# Patient Record
Sex: Female | Born: 1945 | Race: White | Hispanic: No | Marital: Married | Smoking: Never smoker
Health system: Southern US, Community
[De-identification: ages and names within clinical notes are randomized; demographics above are authoritative.]

## PROBLEM LIST (undated history)

## (undated) DIAGNOSIS — C801 Malignant (primary) neoplasm, unspecified: Secondary | ICD-10-CM

## (undated) DIAGNOSIS — G709 Myoneural disorder, unspecified: Secondary | ICD-10-CM

## (undated) DIAGNOSIS — N189 Chronic kidney disease, unspecified: Secondary | ICD-10-CM

## (undated) DIAGNOSIS — H269 Unspecified cataract: Secondary | ICD-10-CM

## (undated) DIAGNOSIS — Z87442 Personal history of urinary calculi: Secondary | ICD-10-CM

## (undated) DIAGNOSIS — M8580 Other specified disorders of bone density and structure, unspecified site: Secondary | ICD-10-CM

## (undated) DIAGNOSIS — M810 Age-related osteoporosis without current pathological fracture: Secondary | ICD-10-CM

## (undated) DIAGNOSIS — T7840XA Allergy, unspecified, initial encounter: Secondary | ICD-10-CM

## (undated) HISTORY — DX: Chronic kidney disease, unspecified: N18.9

## (undated) HISTORY — PX: ABDOMINAL HYSTERECTOMY: SHX81

## (undated) HISTORY — PX: MASTECTOMY: SHX3

## (undated) HISTORY — DX: Unspecified cataract: H26.9

## (undated) HISTORY — PX: EYE SURGERY: SHX253

## (undated) HISTORY — DX: Myoneural disorder, unspecified: G70.9

## (undated) HISTORY — PX: POLYPECTOMY: SHX149

## (undated) HISTORY — DX: Age-related osteoporosis without current pathological fracture: M81.0

## (undated) HISTORY — DX: Allergy, unspecified, initial encounter: T78.40XA

---

## 2000-02-06 ENCOUNTER — Other Ambulatory Visit: Admission: RE | Admit: 2000-02-06 | Discharge: 2000-02-06 | Payer: Self-pay | Admitting: Obstetrics and Gynecology

## 2000-03-27 ENCOUNTER — Other Ambulatory Visit: Admission: RE | Admit: 2000-03-27 | Discharge: 2000-03-27 | Payer: Self-pay | Admitting: Obstetrics and Gynecology

## 2000-06-04 ENCOUNTER — Encounter (INDEPENDENT_AMBULATORY_CARE_PROVIDER_SITE_OTHER): Payer: Self-pay | Admitting: Specialist

## 2000-06-04 ENCOUNTER — Inpatient Hospital Stay (HOSPITAL_COMMUNITY): Admission: RE | Admit: 2000-06-04 | Discharge: 2000-06-07 | Payer: Self-pay | Admitting: Obstetrics and Gynecology

## 2001-02-26 ENCOUNTER — Other Ambulatory Visit: Admission: RE | Admit: 2001-02-26 | Discharge: 2001-02-26 | Payer: Self-pay | Admitting: Obstetrics and Gynecology

## 2002-03-26 ENCOUNTER — Other Ambulatory Visit: Admission: RE | Admit: 2002-03-26 | Discharge: 2002-03-26 | Payer: Self-pay | Admitting: Obstetrics and Gynecology

## 2003-08-31 ENCOUNTER — Other Ambulatory Visit: Admission: RE | Admit: 2003-08-31 | Discharge: 2003-08-31 | Payer: Self-pay | Admitting: Obstetrics and Gynecology

## 2003-09-13 ENCOUNTER — Emergency Department (HOSPITAL_COMMUNITY): Admission: EM | Admit: 2003-09-13 | Discharge: 2003-09-13 | Payer: Self-pay | Admitting: Emergency Medicine

## 2003-11-09 ENCOUNTER — Ambulatory Visit: Payer: Self-pay | Admitting: Cardiology

## 2003-11-15 ENCOUNTER — Encounter: Admission: RE | Admit: 2003-11-15 | Discharge: 2003-11-15 | Payer: Self-pay | Admitting: Obstetrics and Gynecology

## 2003-11-16 ENCOUNTER — Encounter (INDEPENDENT_AMBULATORY_CARE_PROVIDER_SITE_OTHER): Payer: Self-pay | Admitting: Diagnostic Radiology

## 2003-11-16 ENCOUNTER — Encounter: Admission: RE | Admit: 2003-11-16 | Discharge: 2003-11-16 | Payer: Self-pay | Admitting: Obstetrics and Gynecology

## 2003-11-16 ENCOUNTER — Encounter (INDEPENDENT_AMBULATORY_CARE_PROVIDER_SITE_OTHER): Payer: Self-pay | Admitting: *Deleted

## 2003-11-22 ENCOUNTER — Encounter (HOSPITAL_COMMUNITY): Admission: RE | Admit: 2003-11-22 | Discharge: 2004-02-20 | Payer: Self-pay | Admitting: General Surgery

## 2003-11-24 ENCOUNTER — Encounter: Admission: RE | Admit: 2003-11-24 | Discharge: 2003-11-24 | Payer: Self-pay | Admitting: General Surgery

## 2003-12-08 ENCOUNTER — Encounter: Admission: RE | Admit: 2003-12-08 | Discharge: 2003-12-08 | Payer: Self-pay | Admitting: General Surgery

## 2003-12-14 ENCOUNTER — Encounter: Admission: RE | Admit: 2003-12-14 | Discharge: 2003-12-14 | Payer: Self-pay | Admitting: General Surgery

## 2003-12-16 ENCOUNTER — Ambulatory Visit (HOSPITAL_BASED_OUTPATIENT_CLINIC_OR_DEPARTMENT_OTHER): Admission: RE | Admit: 2003-12-16 | Discharge: 2003-12-16 | Payer: Self-pay | Admitting: General Surgery

## 2003-12-16 ENCOUNTER — Encounter (INDEPENDENT_AMBULATORY_CARE_PROVIDER_SITE_OTHER): Payer: Self-pay | Admitting: General Surgery

## 2003-12-16 ENCOUNTER — Ambulatory Visit (HOSPITAL_COMMUNITY): Admission: RE | Admit: 2003-12-16 | Discharge: 2003-12-16 | Payer: Self-pay | Admitting: General Surgery

## 2003-12-16 ENCOUNTER — Encounter (INDEPENDENT_AMBULATORY_CARE_PROVIDER_SITE_OTHER): Payer: Self-pay | Admitting: *Deleted

## 2003-12-20 ENCOUNTER — Ambulatory Visit: Payer: Self-pay | Admitting: Hematology & Oncology

## 2003-12-30 ENCOUNTER — Ambulatory Visit (HOSPITAL_COMMUNITY): Admission: RE | Admit: 2003-12-30 | Discharge: 2003-12-30 | Payer: Self-pay | Admitting: Hematology & Oncology

## 2004-01-08 ENCOUNTER — Ambulatory Visit: Admission: RE | Admit: 2004-01-08 | Discharge: 2004-02-18 | Payer: Self-pay | Admitting: *Deleted

## 2004-01-20 ENCOUNTER — Ambulatory Visit (HOSPITAL_BASED_OUTPATIENT_CLINIC_OR_DEPARTMENT_OTHER): Admission: RE | Admit: 2004-01-20 | Discharge: 2004-01-20 | Payer: Self-pay | Admitting: General Surgery

## 2004-01-20 ENCOUNTER — Encounter (INDEPENDENT_AMBULATORY_CARE_PROVIDER_SITE_OTHER): Payer: Self-pay | Admitting: Specialist

## 2004-01-20 ENCOUNTER — Ambulatory Visit (HOSPITAL_COMMUNITY): Admission: RE | Admit: 2004-01-20 | Discharge: 2004-01-20 | Payer: Self-pay | Admitting: General Surgery

## 2004-02-07 ENCOUNTER — Ambulatory Visit (HOSPITAL_COMMUNITY): Admission: RE | Admit: 2004-02-07 | Discharge: 2004-02-07 | Payer: Self-pay | Admitting: General Surgery

## 2004-02-07 ENCOUNTER — Ambulatory Visit (HOSPITAL_BASED_OUTPATIENT_CLINIC_OR_DEPARTMENT_OTHER): Admission: RE | Admit: 2004-02-07 | Discharge: 2004-02-07 | Payer: Self-pay | Admitting: General Surgery

## 2004-02-09 ENCOUNTER — Ambulatory Visit: Payer: Self-pay | Admitting: Hematology & Oncology

## 2004-02-26 ENCOUNTER — Ambulatory Visit (HOSPITAL_COMMUNITY): Admission: AD | Admit: 2004-02-26 | Discharge: 2004-02-26 | Payer: Self-pay | Admitting: General Surgery

## 2004-03-02 ENCOUNTER — Ambulatory Visit: Admission: RE | Admit: 2004-03-02 | Discharge: 2004-03-02 | Payer: Self-pay | Admitting: Hematology & Oncology

## 2004-03-11 ENCOUNTER — Ambulatory Visit (HOSPITAL_COMMUNITY): Admission: AD | Admit: 2004-03-11 | Discharge: 2004-03-11 | Payer: Self-pay | Admitting: Hematology & Oncology

## 2004-03-22 ENCOUNTER — Ambulatory Visit: Admission: RE | Admit: 2004-03-22 | Discharge: 2004-03-22 | Payer: Self-pay | Admitting: Hematology & Oncology

## 2004-03-29 ENCOUNTER — Ambulatory Visit: Payer: Self-pay | Admitting: Hematology & Oncology

## 2004-04-13 ENCOUNTER — Ambulatory Visit: Admission: RE | Admit: 2004-04-13 | Discharge: 2004-04-13 | Payer: Self-pay | Admitting: Hematology & Oncology

## 2004-05-16 ENCOUNTER — Ambulatory Visit: Payer: Self-pay | Admitting: Hematology & Oncology

## 2004-06-12 ENCOUNTER — Ambulatory Visit: Admission: RE | Admit: 2004-06-12 | Discharge: 2004-08-21 | Payer: Self-pay | Admitting: *Deleted

## 2004-06-12 ENCOUNTER — Ambulatory Visit (HOSPITAL_BASED_OUTPATIENT_CLINIC_OR_DEPARTMENT_OTHER): Admission: RE | Admit: 2004-06-12 | Discharge: 2004-06-12 | Payer: Self-pay | Admitting: General Surgery

## 2004-06-28 ENCOUNTER — Encounter: Admission: RE | Admit: 2004-06-28 | Discharge: 2004-07-07 | Payer: Self-pay | Admitting: General Surgery

## 2004-07-04 ENCOUNTER — Ambulatory Visit: Payer: Self-pay | Admitting: Hematology & Oncology

## 2004-08-22 ENCOUNTER — Ambulatory Visit: Payer: Self-pay | Admitting: Hematology & Oncology

## 2004-11-16 ENCOUNTER — Ambulatory Visit: Payer: Self-pay | Admitting: Hematology & Oncology

## 2004-12-20 ENCOUNTER — Other Ambulatory Visit: Admission: RE | Admit: 2004-12-20 | Discharge: 2004-12-20 | Payer: Self-pay | Admitting: Obstetrics and Gynecology

## 2005-01-10 ENCOUNTER — Ambulatory Visit: Payer: Self-pay | Admitting: Internal Medicine

## 2005-02-02 ENCOUNTER — Ambulatory Visit: Payer: Self-pay | Admitting: Internal Medicine

## 2005-02-15 ENCOUNTER — Encounter: Admission: RE | Admit: 2005-02-15 | Discharge: 2005-02-15 | Payer: Self-pay | Admitting: Obstetrics and Gynecology

## 2005-03-28 ENCOUNTER — Ambulatory Visit: Payer: Self-pay | Admitting: Hematology & Oncology

## 2005-07-02 ENCOUNTER — Ambulatory Visit: Payer: Self-pay | Admitting: Hematology & Oncology

## 2005-07-04 LAB — CBC WITH DIFFERENTIAL/PLATELET
Basophils Absolute: 0 10*3/uL (ref 0.0–0.1)
Eosinophils Absolute: 0.6 10*3/uL — ABNORMAL HIGH (ref 0.0–0.5)
HCT: 41.6 % (ref 34.8–46.6)
LYMPH%: 30 % (ref 14.0–48.0)
MONO#: 0.4 10*3/uL (ref 0.1–0.9)
NEUT#: 2.1 10*3/uL (ref 1.5–6.5)
NEUT%: 47.1 % (ref 39.6–76.8)
Platelets: 220 10*3/uL (ref 145–400)
WBC: 4.6 10*3/uL (ref 3.9–10.0)

## 2005-07-04 LAB — COMPREHENSIVE METABOLIC PANEL
CO2: 26 mEq/L (ref 19–32)
Creatinine, Ser: 0.75 mg/dL (ref 0.40–1.20)
Glucose, Bld: 122 mg/dL — ABNORMAL HIGH (ref 70–99)
Total Bilirubin: 0.7 mg/dL (ref 0.3–1.2)
Total Protein: 6.4 g/dL (ref 6.0–8.3)

## 2005-07-04 LAB — LACTATE DEHYDROGENASE: LDH: 141 U/L (ref 94–250)

## 2005-10-24 ENCOUNTER — Ambulatory Visit: Payer: Self-pay | Admitting: Hematology & Oncology

## 2006-02-19 ENCOUNTER — Ambulatory Visit: Payer: Self-pay | Admitting: Hematology & Oncology

## 2006-02-22 ENCOUNTER — Ambulatory Visit (HOSPITAL_COMMUNITY): Admission: RE | Admit: 2006-02-22 | Discharge: 2006-02-22 | Payer: Self-pay | Admitting: Hematology & Oncology

## 2006-02-22 LAB — CBC WITH DIFFERENTIAL/PLATELET
Basophils Absolute: 0 10*3/uL (ref 0.0–0.1)
EOS%: 5.8 % (ref 0.0–7.0)
HCT: 40.3 % (ref 34.8–46.6)
MONO%: 14.9 % — ABNORMAL HIGH (ref 0.0–13.0)
NEUT#: 1.3 10*3/uL — ABNORMAL LOW (ref 1.5–6.5)
Platelets: 194 10*3/uL (ref 145–400)
RBC: 4.3 10*6/uL (ref 3.70–5.32)
RDW: 12.9 % (ref 11.3–14.5)
WBC: 2.9 10*3/uL — ABNORMAL LOW (ref 3.9–10.0)

## 2006-02-22 LAB — COMPREHENSIVE METABOLIC PANEL
AST: 19 U/L (ref 0–37)
Albumin: 4.3 g/dL (ref 3.5–5.2)
Alkaline Phosphatase: 90 U/L (ref 39–117)
BUN: 14 mg/dL (ref 6–23)
Potassium: 3.9 mEq/L (ref 3.5–5.3)
Total Bilirubin: 0.6 mg/dL (ref 0.3–1.2)

## 2006-03-07 ENCOUNTER — Encounter: Admission: RE | Admit: 2006-03-07 | Discharge: 2006-03-07 | Payer: Self-pay | Admitting: Obstetrics and Gynecology

## 2006-03-19 ENCOUNTER — Encounter: Admission: RE | Admit: 2006-03-19 | Discharge: 2006-03-19 | Payer: Self-pay | Admitting: General Surgery

## 2006-06-20 ENCOUNTER — Ambulatory Visit: Payer: Self-pay | Admitting: Hematology & Oncology

## 2006-06-24 LAB — CBC WITH DIFFERENTIAL/PLATELET
Basophils Absolute: 0 10*3/uL (ref 0.0–0.1)
EOS%: 2.8 % (ref 0.0–7.0)
HCT: 38.9 % (ref 34.8–46.6)
HGB: 13.7 g/dL (ref 11.6–15.9)
LYMPH%: 23.1 % (ref 14.0–48.0)
MCH: 32.8 pg (ref 26.0–34.0)
MCV: 92.9 fL (ref 81.0–101.0)
MONO%: 6.6 % (ref 0.0–13.0)
NEUT%: 66.7 % (ref 39.6–76.8)
Platelets: 231 10*3/uL (ref 145–400)
RDW: 12.6 % (ref 11.3–14.5)

## 2006-06-24 LAB — LIPID PANEL
HDL: 61 mg/dL (ref >39)
LDL Cholesterol: 134 mg/dL — ABNORMAL HIGH (ref 0–99)
Total CHOL/HDL Ratio: 3.5 Ratio
Triglycerides: 85 mg/dL (ref <150)

## 2006-06-24 LAB — COMPREHENSIVE METABOLIC PANEL
AST: 20 U/L (ref 0–37)
Alkaline Phosphatase: 83 U/L (ref 39–117)
BUN: 14 mg/dL (ref 6–23)
Creatinine, Ser: 0.63 mg/dL (ref 0.40–1.20)

## 2006-11-19 ENCOUNTER — Ambulatory Visit (HOSPITAL_COMMUNITY): Admission: RE | Admit: 2006-11-19 | Discharge: 2006-11-19 | Payer: Self-pay | Admitting: Physician Assistant

## 2006-12-19 ENCOUNTER — Ambulatory Visit: Payer: Self-pay | Admitting: Hematology & Oncology

## 2006-12-23 LAB — CBC WITH DIFFERENTIAL/PLATELET
BASO%: 0.5 % (ref 0.0–2.0)
EOS%: 5.1 % (ref 0.0–7.0)
LYMPH%: 29.4 % (ref 14.0–48.0)
MCH: 32.6 pg (ref 26.0–34.0)
MCHC: 35.4 g/dL (ref 32.0–36.0)
MONO#: 0.3 10*3/uL (ref 0.1–0.9)
Platelets: 228 10*3/uL (ref 145–400)
RBC: 4.49 10*6/uL (ref 3.70–5.32)
WBC: 4.3 10*3/uL (ref 3.9–10.0)
lymph#: 1.3 10*3/uL (ref 0.9–3.3)

## 2006-12-23 LAB — COMPREHENSIVE METABOLIC PANEL
ALT: 29 U/L (ref 0–35)
Albumin: 4.3 g/dL (ref 3.5–5.2)
CO2: 28 mEq/L (ref 19–32)
Calcium: 9.7 mg/dL (ref 8.4–10.5)
Chloride: 104 mEq/L (ref 96–112)
Creatinine, Ser: 0.8 mg/dL (ref 0.40–1.20)
Potassium: 4.2 mEq/L (ref 3.5–5.3)
Sodium: 143 mEq/L (ref 135–145)
Total Protein: 6.6 g/dL (ref 6.0–8.3)

## 2007-01-09 DIAGNOSIS — C801 Malignant (primary) neoplasm, unspecified: Secondary | ICD-10-CM

## 2007-01-09 HISTORY — DX: Malignant (primary) neoplasm, unspecified: C80.1

## 2007-04-18 ENCOUNTER — Encounter (INDEPENDENT_AMBULATORY_CARE_PROVIDER_SITE_OTHER): Payer: Self-pay | Admitting: Radiology

## 2007-04-18 ENCOUNTER — Encounter: Admission: RE | Admit: 2007-04-18 | Discharge: 2007-04-18 | Payer: Self-pay | Admitting: Obstetrics and Gynecology

## 2007-06-03 ENCOUNTER — Inpatient Hospital Stay (HOSPITAL_COMMUNITY): Admission: RE | Admit: 2007-06-03 | Discharge: 2007-06-06 | Payer: Self-pay | Admitting: General Surgery

## 2007-06-03 ENCOUNTER — Encounter (INDEPENDENT_AMBULATORY_CARE_PROVIDER_SITE_OTHER): Payer: Self-pay | Admitting: General Surgery

## 2007-06-17 ENCOUNTER — Ambulatory Visit: Payer: Self-pay | Admitting: Hematology & Oncology

## 2007-06-23 LAB — CBC WITH DIFFERENTIAL/PLATELET
BASO%: 0.2 % (ref 0.0–2.0)
Eosinophils Absolute: 0.2 10*3/uL (ref 0.0–0.5)
HCT: 39.3 % (ref 34.8–46.6)
LYMPH%: 13.8 % — ABNORMAL LOW (ref 14.0–48.0)
MCHC: 34.2 g/dL (ref 32.0–36.0)
MCV: 91.3 fL (ref 81.0–101.0)
MONO#: 0.5 10*3/uL (ref 0.1–0.9)
NEUT%: 78.7 % — ABNORMAL HIGH (ref 39.6–76.8)
Platelets: 329 10*3/uL (ref 145–400)
WBC: 9.3 10*3/uL (ref 3.9–10.0)

## 2007-06-23 LAB — COMPREHENSIVE METABOLIC PANEL
CO2: 25 mEq/L (ref 19–32)
Creatinine, Ser: 0.7 mg/dL (ref 0.40–1.20)
Glucose, Bld: 134 mg/dL — ABNORMAL HIGH (ref 70–99)
Total Bilirubin: 0.7 mg/dL (ref 0.3–1.2)

## 2007-06-24 ENCOUNTER — Inpatient Hospital Stay (HOSPITAL_COMMUNITY): Admission: EM | Admit: 2007-06-24 | Discharge: 2007-06-26 | Payer: Self-pay | Admitting: Emergency Medicine

## 2007-06-24 ENCOUNTER — Ambulatory Visit (HOSPITAL_COMMUNITY): Admission: RE | Admit: 2007-06-24 | Discharge: 2007-06-24 | Payer: Self-pay | Admitting: Hematology & Oncology

## 2007-06-25 ENCOUNTER — Ambulatory Visit: Payer: Self-pay | Admitting: Hematology & Oncology

## 2007-06-25 ENCOUNTER — Encounter (INDEPENDENT_AMBULATORY_CARE_PROVIDER_SITE_OTHER): Payer: Self-pay | Admitting: Internal Medicine

## 2007-07-18 ENCOUNTER — Ambulatory Visit: Payer: Self-pay | Admitting: Hematology & Oncology

## 2007-07-21 LAB — PROTIME-INR (CHCC SATELLITE): Protime: 38.4 Seconds — ABNORMAL HIGH (ref 10.6–13.4)

## 2007-08-01 LAB — PROTIME-INR (CHCC SATELLITE)
INR: 2.8 (ref 2.0–3.5)
Protime: 33.6 Seconds — ABNORMAL HIGH (ref 10.6–13.4)

## 2007-09-01 ENCOUNTER — Ambulatory Visit: Payer: Self-pay | Admitting: Hematology & Oncology

## 2007-09-04 ENCOUNTER — Ambulatory Visit: Payer: Self-pay | Admitting: Hematology & Oncology

## 2007-09-04 LAB — COMPREHENSIVE METABOLIC PANEL
ALT: 20 U/L (ref 0–35)
Albumin: 4.3 g/dL (ref 3.5–5.2)
Alkaline Phosphatase: 73 U/L (ref 39–117)
Glucose, Bld: 98 mg/dL (ref 70–99)
Potassium: 4.2 mEq/L (ref 3.5–5.3)
Sodium: 140 mEq/L (ref 135–145)
Total Protein: 6.7 g/dL (ref 6.0–8.3)

## 2007-09-04 LAB — CBC WITH DIFFERENTIAL (CANCER CENTER ONLY)
BASO#: 0 10*3/uL (ref 0.0–0.2)
Eosinophils Absolute: 0.1 10*3/uL (ref 0.0–0.5)
HGB: 14.6 g/dL (ref 11.6–15.9)
LYMPH#: 1.1 10*3/uL (ref 0.9–3.3)
MCH: 30.8 pg (ref 26.0–34.0)
MONO#: 0.3 10*3/uL (ref 0.1–0.9)
NEUT#: 2.6 10*3/uL (ref 1.5–6.5)
Platelets: 187 10*3/uL (ref 145–400)
RBC: 4.73 10*6/uL (ref 3.70–5.32)
WBC: 4.2 10*3/uL (ref 3.9–10.0)

## 2007-09-04 LAB — PROTIME-INR (CHCC SATELLITE): Protime: 25.2 Seconds — ABNORMAL HIGH (ref 10.6–13.4)

## 2007-09-11 LAB — CBC WITH DIFFERENTIAL (CANCER CENTER ONLY)
BASO%: 0.9 % (ref 0.0–2.0)
EOS%: 5.4 % (ref 0.0–7.0)
LYMPH#: 1.5 10*3/uL (ref 0.9–3.3)
MCH: 29.6 pg (ref 26.0–34.0)
MCHC: 34.2 g/dL (ref 32.0–36.0)
MONO%: 4.5 % (ref 0.0–13.0)
NEUT#: 1.5 10*3/uL (ref 1.5–6.5)
RDW: 14.1 % (ref 10.5–14.6)

## 2007-09-11 LAB — PROTIME-INR (CHCC SATELLITE): INR: 2.4 (ref 2.0–3.5)

## 2007-09-18 LAB — CBC WITH DIFFERENTIAL (CANCER CENTER ONLY)
BASO#: 0 10*3/uL (ref 0.0–0.2)
BASO%: 0.9 % (ref 0.0–2.0)
EOS%: 6.7 % (ref 0.0–7.0)
HCT: 38.5 % (ref 34.8–46.6)
LYMPH%: 53.2 % — ABNORMAL HIGH (ref 14.0–48.0)
MCH: 29.3 pg (ref 26.0–34.0)
MCHC: 34.3 g/dL (ref 32.0–36.0)
MCV: 86 fL (ref 81–101)
MONO%: 5.4 % (ref 0.0–13.0)
NEUT%: 33.8 % — ABNORMAL LOW (ref 39.6–80.0)
RDW: 14.7 % — ABNORMAL HIGH (ref 10.5–14.6)

## 2007-09-18 LAB — PROTIME-INR (CHCC SATELLITE): Protime: 26.4 Seconds — ABNORMAL HIGH (ref 10.6–13.4)

## 2007-09-30 LAB — CBC WITH DIFFERENTIAL/PLATELET
BASO%: 1.9 % (ref 0.0–2.0)
EOS%: 2.8 % (ref 0.0–7.0)
HCT: 40.4 % (ref 34.8–46.6)
LYMPH%: 36.3 % (ref 14.0–48.0)
MCH: 29.9 pg (ref 26.0–34.0)
MCHC: 34.4 g/dL (ref 32.0–36.0)
MCV: 87.2 fL (ref 81.0–101.0)
MONO%: 12.2 % (ref 0.0–13.0)
NEUT%: 46.9 % (ref 39.6–76.8)
Platelets: 310 10*3/uL (ref 145–400)
RBC: 4.63 10*6/uL (ref 3.70–5.32)
WBC: 3.1 10*3/uL — ABNORMAL LOW (ref 3.9–10.0)

## 2007-10-02 LAB — COMPREHENSIVE METABOLIC PANEL
ALT: 36 U/L — ABNORMAL HIGH (ref 0–35)
AST: 26 U/L (ref 0–37)
Alkaline Phosphatase: 105 U/L (ref 39–117)
CO2: 26 mEq/L (ref 19–32)
Creatinine, Ser: 0.67 mg/dL (ref 0.40–1.20)
Sodium: 141 mEq/L (ref 135–145)
Total Bilirubin: 0.5 mg/dL (ref 0.3–1.2)
Total Protein: 7.2 g/dL (ref 6.0–8.3)

## 2007-10-02 LAB — CBC WITH DIFFERENTIAL (CANCER CENTER ONLY)
BASO#: 0 10*3/uL (ref 0.0–0.2)
BASO%: 0.9 % (ref 0.0–2.0)
LYMPH#: 1.4 10*3/uL (ref 0.9–3.3)
LYMPH%: 33.6 % (ref 14.0–48.0)
MCH: 29.4 pg (ref 26.0–34.0)
MCHC: 34.1 g/dL (ref 32.0–36.0)
NEUT#: 2.1 10*3/uL (ref 1.5–6.5)
NEUT%: 52.5 % (ref 39.6–80.0)
Platelets: 259 10*3/uL (ref 145–400)
RBC: 4.73 10*6/uL (ref 3.70–5.32)

## 2007-10-02 LAB — PROTIME-INR (CHCC SATELLITE): Protime: 27.6 Seconds — ABNORMAL HIGH (ref 10.6–13.4)

## 2007-10-09 LAB — CBC WITH DIFFERENTIAL (CANCER CENTER ONLY)
BASO#: 0 10*3/uL (ref 0.0–0.2)
EOS%: 4.8 % (ref 0.0–7.0)
Eosinophils Absolute: 0.2 10*3/uL (ref 0.0–0.5)
HCT: 37.9 % (ref 34.8–46.6)
HGB: 13 g/dL (ref 11.6–15.9)
LYMPH#: 1.6 10*3/uL (ref 0.9–3.3)
MCHC: 34.4 g/dL (ref 32.0–36.0)
NEUT%: 47.2 % (ref 39.6–80.0)
RBC: 4.42 10*6/uL (ref 3.70–5.32)

## 2007-10-16 LAB — CBC WITH DIFFERENTIAL (CANCER CENTER ONLY)
BASO#: 0 10*3/uL (ref 0.0–0.2)
EOS%: 5.8 % (ref 0.0–7.0)
Eosinophils Absolute: 0.2 10*3/uL (ref 0.0–0.5)
HCT: 35.7 % (ref 34.8–46.6)
HGB: 12.5 g/dL (ref 11.6–15.9)
LYMPH%: 46.5 % (ref 14.0–48.0)
MCH: 30.2 pg (ref 26.0–34.0)
MCHC: 34.9 g/dL (ref 32.0–36.0)
MCV: 86 fL (ref 81–101)
MONO%: 4.5 % (ref 0.0–13.0)
NEUT%: 42.2 % (ref 39.6–80.0)
RBC: 4.14 10*6/uL (ref 3.70–5.32)

## 2007-10-16 LAB — PROTIME-INR (CHCC SATELLITE): Protime: 26.4 Seconds — ABNORMAL HIGH (ref 10.6–13.4)

## 2007-10-27 ENCOUNTER — Ambulatory Visit: Payer: Self-pay | Admitting: Hematology & Oncology

## 2007-10-28 LAB — COMPREHENSIVE METABOLIC PANEL
ALT: 25 U/L (ref 0–35)
AST: 21 U/L (ref 0–37)
Albumin: 4.1 g/dL (ref 3.5–5.2)
BUN: 13 mg/dL (ref 6–23)
CO2: 24 mEq/L (ref 19–32)
Calcium: 9.4 mg/dL (ref 8.4–10.5)
Chloride: 106 mEq/L (ref 96–112)
Potassium: 3.9 mEq/L (ref 3.5–5.3)

## 2007-10-28 LAB — CBC WITH DIFFERENTIAL (CANCER CENTER ONLY)
EOS%: 4.1 % (ref 0.0–7.0)
Eosinophils Absolute: 0.1 10*3/uL (ref 0.0–0.5)
LYMPH%: 41.4 % (ref 14.0–48.0)
MCH: 30.2 pg (ref 26.0–34.0)
MCHC: 34.4 g/dL (ref 32.0–36.0)
MCV: 88 fL (ref 81–101)
MONO%: 9.1 % (ref 0.0–13.0)
NEUT#: 1.3 10*3/uL — ABNORMAL LOW (ref 1.5–6.5)
Platelets: 326 10*3/uL (ref 145–400)

## 2007-10-28 LAB — PROTIME-INR (CHCC SATELLITE): INR: 1.7 — ABNORMAL LOW (ref 2.0–3.5)

## 2007-11-06 LAB — CBC WITH DIFFERENTIAL (CANCER CENTER ONLY)
BASO#: 0 10*3/uL (ref 0.0–0.2)
EOS%: 3.6 % (ref 0.0–7.0)
HGB: 12.6 g/dL (ref 11.6–15.9)
LYMPH#: 1.8 10*3/uL (ref 0.9–3.3)
MCH: 30.5 pg (ref 26.0–34.0)
MCHC: 34.5 g/dL (ref 32.0–36.0)
MONO%: 7.4 % (ref 0.0–13.0)
NEUT#: 2.1 10*3/uL (ref 1.5–6.5)
Platelets: 230 10*3/uL (ref 145–400)
RBC: 4.15 10*6/uL (ref 3.70–5.32)

## 2007-11-13 LAB — CBC WITH DIFFERENTIAL (CANCER CENTER ONLY)
BASO#: 0 10*3/uL (ref 0.0–0.2)
Eosinophils Absolute: 0.2 10*3/uL (ref 0.0–0.5)
HGB: 12 g/dL (ref 11.6–15.9)
MCV: 91 fL (ref 81–101)
MONO#: 0.2 10*3/uL (ref 0.1–0.9)
NEUT#: 1.8 10*3/uL (ref 1.5–6.5)
Platelets: 237 10*3/uL (ref 145–400)
RBC: 3.83 10*6/uL (ref 3.70–5.32)
WBC: 3.5 10*3/uL — ABNORMAL LOW (ref 3.9–10.0)

## 2007-11-13 LAB — PROTIME-INR (CHCC SATELLITE)
INR: 4.3 — ABNORMAL HIGH (ref 2.0–3.5)
Protime: 51.6 Seconds — ABNORMAL HIGH (ref 10.6–13.4)

## 2007-11-27 LAB — CBC WITH DIFFERENTIAL (CANCER CENTER ONLY)
BASO%: 0.9 % (ref 0.0–2.0)
EOS%: 3.9 % (ref 0.0–7.0)
LYMPH#: 1.3 10*3/uL (ref 0.9–3.3)
MONO#: 0.4 10*3/uL (ref 0.1–0.9)
Platelets: 289 10*3/uL (ref 145–400)
RDW: 14.3 % (ref 10.5–14.6)
WBC: 3.7 10*3/uL — ABNORMAL LOW (ref 3.9–10.0)

## 2007-11-27 LAB — PROTIME-INR (CHCC SATELLITE)
INR: 2.8 (ref 2.0–3.5)
Protime: 33.6 Seconds — ABNORMAL HIGH (ref 10.6–13.4)

## 2007-11-28 LAB — COMPREHENSIVE METABOLIC PANEL
AST: 18 U/L (ref 0–37)
Alkaline Phosphatase: 85 U/L (ref 39–117)
BUN: 13 mg/dL (ref 6–23)
Glucose, Bld: 96 mg/dL (ref 70–99)
Sodium: 141 mEq/L (ref 135–145)
Total Bilirubin: 0.6 mg/dL (ref 0.3–1.2)

## 2007-11-28 LAB — LIPID PANEL
HDL: 46 mg/dL (ref >39)
LDL Cholesterol: 151 mg/dL — ABNORMAL HIGH (ref 0–99)

## 2007-12-05 LAB — CBC WITH DIFFERENTIAL (CANCER CENTER ONLY)
BASO#: 0 10*3/uL (ref 0.0–0.2)
BASO%: 0.8 % (ref 0.0–2.0)
HCT: 34.8 % (ref 34.8–46.6)
HGB: 11.9 g/dL (ref 11.6–15.9)
LYMPH#: 1.5 10*3/uL (ref 0.9–3.3)
MONO#: 0.4 10*3/uL (ref 0.1–0.9)
NEUT#: 3.2 10*3/uL (ref 1.5–6.5)
NEUT%: 60.2 % (ref 39.6–80.0)
RDW: 14 % (ref 10.5–14.6)
WBC: 5.2 10*3/uL (ref 3.9–10.0)

## 2007-12-11 LAB — CBC WITH DIFFERENTIAL (CANCER CENTER ONLY)
BASO#: 0 10*3/uL (ref 0.0–0.2)
BASO%: 0.8 % (ref 0.0–2.0)
EOS%: 6 % (ref 0.0–7.0)
HCT: 35.2 % (ref 34.8–46.6)
HGB: 12.3 g/dL (ref 11.6–15.9)
LYMPH#: 1 10*3/uL (ref 0.9–3.3)
LYMPH%: 32.9 % (ref 14.0–48.0)
MCH: 32.5 pg (ref 26.0–34.0)
MCHC: 34.9 g/dL (ref 32.0–36.0)
MCV: 93 fL (ref 81–101)
MONO%: 5.6 % (ref 0.0–13.0)
NEUT%: 54.7 % (ref 39.6–80.0)
RDW: 13.6 % (ref 10.5–14.6)

## 2007-12-11 LAB — PROTIME-INR (CHCC SATELLITE)

## 2007-12-18 ENCOUNTER — Ambulatory Visit (HOSPITAL_COMMUNITY): Admission: RE | Admit: 2007-12-18 | Discharge: 2007-12-18 | Payer: Self-pay | Admitting: Hematology & Oncology

## 2008-01-05 ENCOUNTER — Ambulatory Visit: Payer: Self-pay | Admitting: Hematology & Oncology

## 2008-01-05 LAB — CBC WITH DIFFERENTIAL (CANCER CENTER ONLY)
BASO#: 0 10*3/uL (ref 0.0–0.2)
EOS%: 5.2 % (ref 0.0–7.0)
Eosinophils Absolute: 0.2 10*3/uL (ref 0.0–0.5)
HGB: 13.8 g/dL (ref 11.6–15.9)
LYMPH#: 1.5 10*3/uL (ref 0.9–3.3)
MCH: 31.6 pg (ref 26.0–34.0)
MONO%: 6.7 % (ref 0.0–13.0)
NEUT#: 2.4 10*3/uL (ref 1.5–6.5)
Platelets: 211 10*3/uL (ref 145–400)
RBC: 4.35 10*6/uL (ref 3.70–5.32)

## 2008-01-05 LAB — PROTIME-INR (CHCC SATELLITE): Protime: 28.8 Seconds — ABNORMAL HIGH (ref 10.6–13.4)

## 2008-01-12 LAB — PROTIME-INR (CHCC SATELLITE)

## 2008-01-15 LAB — CBC WITH DIFFERENTIAL (CANCER CENTER ONLY)
BASO#: 0 10*3/uL (ref 0.0–0.2)
BASO%: 0.5 % (ref 0.0–2.0)
HCT: 39.9 % (ref 34.8–46.6)
HGB: 13.4 g/dL (ref 11.6–15.9)
LYMPH%: 22.4 % (ref 14.0–48.0)
MCHC: 33.6 g/dL (ref 32.0–36.0)
MCV: 93 fL (ref 81–101)
MONO#: 0.4 10*3/uL (ref 0.1–0.9)
NEUT%: 61.7 % (ref 39.6–80.0)
RDW: 12.9 % (ref 10.5–14.6)
WBC: 4.9 10*3/uL (ref 3.9–10.0)

## 2008-01-15 LAB — COMPREHENSIVE METABOLIC PANEL
ALT: 38 U/L — ABNORMAL HIGH (ref 0–35)
AST: 19 U/L (ref 0–37)
Albumin: 4 g/dL (ref 3.5–5.2)
Alkaline Phosphatase: 124 U/L — ABNORMAL HIGH (ref 39–117)
BUN: 12 mg/dL (ref 6–23)
CO2: 26 mEq/L (ref 19–32)
Calcium: 9.2 mg/dL (ref 8.4–10.5)
Chloride: 104 mEq/L (ref 96–112)
Creatinine, Ser: 0.59 mg/dL (ref 0.40–1.20)
Glucose, Bld: 86 mg/dL (ref 70–99)
Potassium: 4.2 mEq/L (ref 3.5–5.3)
Sodium: 141 mEq/L (ref 135–145)
Total Bilirubin: 0.4 mg/dL (ref 0.3–1.2)
Total Protein: 6.5 g/dL (ref 6.0–8.3)

## 2008-01-15 LAB — PROTIME-INR (CHCC SATELLITE): Protime: 32.4 Seconds — ABNORMAL HIGH (ref 10.6–13.4)

## 2008-02-05 LAB — COMPREHENSIVE METABOLIC PANEL
ALT: 22 U/L (ref 0–35)
AST: 22 U/L (ref 0–37)
Albumin: 4.2 g/dL (ref 3.5–5.2)
Alkaline Phosphatase: 81 U/L (ref 39–117)
BUN: 14 mg/dL (ref 6–23)
Calcium: 9.9 mg/dL (ref 8.4–10.5)
Chloride: 104 mEq/L (ref 96–112)
Potassium: 4.1 mEq/L (ref 3.5–5.3)

## 2008-02-05 LAB — CBC WITH DIFFERENTIAL (CANCER CENTER ONLY)
BASO#: 0 10*3/uL (ref 0.0–0.2)
BASO%: 0.7 % (ref 0.0–2.0)
EOS%: 6.6 % (ref 0.0–7.0)
LYMPH#: 1.4 10*3/uL (ref 0.9–3.3)
MCH: 30.6 pg (ref 26.0–34.0)
MCHC: 33.6 g/dL (ref 32.0–36.0)
MONO%: 6.3 % (ref 0.0–13.0)
NEUT#: 1.6 10*3/uL (ref 1.5–6.5)
Platelets: 157 10*3/uL (ref 145–400)
RDW: 13.1 % (ref 10.5–14.6)

## 2008-02-05 LAB — PROTIME-INR (CHCC SATELLITE): INR: 3 (ref 2.0–3.5)

## 2008-02-20 ENCOUNTER — Ambulatory Visit: Payer: Self-pay | Admitting: Hematology & Oncology

## 2008-02-20 LAB — CBC WITH DIFFERENTIAL (CANCER CENTER ONLY)
Eosinophils Absolute: 0.2 10*3/uL (ref 0.0–0.5)
HCT: 40.3 % (ref 34.8–46.6)
LYMPH#: 1.4 10*3/uL (ref 0.9–3.3)
LYMPH%: 34.5 % (ref 14.0–48.0)
MCV: 91 fL (ref 81–101)
MONO#: 0.3 10*3/uL (ref 0.1–0.9)
NEUT%: 51.8 % (ref 39.6–80.0)
RBC: 4.4 10*6/uL (ref 3.70–5.32)
WBC: 4.2 10*3/uL (ref 3.9–10.0)

## 2008-03-19 LAB — PROTIME-INR (CHCC SATELLITE)

## 2008-03-30 ENCOUNTER — Ambulatory Visit (HOSPITAL_BASED_OUTPATIENT_CLINIC_OR_DEPARTMENT_OTHER): Admission: RE | Admit: 2008-03-30 | Discharge: 2008-03-30 | Payer: Self-pay | Admitting: Hematology & Oncology

## 2008-03-30 ENCOUNTER — Ambulatory Visit: Payer: Self-pay | Admitting: Diagnostic Radiology

## 2008-04-14 ENCOUNTER — Ambulatory Visit: Payer: Self-pay | Admitting: Hematology & Oncology

## 2008-04-16 LAB — CBC WITH DIFFERENTIAL (CANCER CENTER ONLY)
BASO#: 0 10*3/uL (ref 0.0–0.2)
EOS%: 6.5 % (ref 0.0–7.0)
Eosinophils Absolute: 0.2 10*3/uL (ref 0.0–0.5)
HCT: 42.7 % (ref 34.8–46.6)
HGB: 14.2 g/dL (ref 11.6–15.9)
LYMPH%: 39.3 % (ref 14.0–48.0)
MCH: 30.9 pg (ref 26.0–34.0)
MCHC: 33.4 g/dL (ref 32.0–36.0)
MCV: 93 fL (ref 81–101)
MONO%: 7.4 % (ref 0.0–13.0)
NEUT%: 46.2 % (ref 39.6–80.0)
RBC: 4.61 10*6/uL (ref 3.70–5.32)

## 2008-04-16 LAB — COMPREHENSIVE METABOLIC PANEL
ALT: 23 U/L (ref 0–35)
AST: 20 U/L (ref 0–37)
Albumin: 4.4 g/dL (ref 3.5–5.2)
Alkaline Phosphatase: 70 U/L (ref 39–117)
BUN: 14 mg/dL (ref 6–23)
Calcium: 10.1 mg/dL (ref 8.4–10.5)
Chloride: 106 mEq/L (ref 96–112)
Potassium: 4.2 mEq/L (ref 3.5–5.3)
Sodium: 143 mEq/L (ref 135–145)
Total Protein: 6.6 g/dL (ref 6.0–8.3)

## 2008-04-16 LAB — PROTIME-INR (CHCC SATELLITE)

## 2008-05-21 LAB — PROTIME-INR (CHCC SATELLITE): INR: 2.2 (ref 2.0–3.5)

## 2008-06-15 ENCOUNTER — Ambulatory Visit: Payer: Self-pay | Admitting: Hematology & Oncology

## 2008-06-17 LAB — CBC WITH DIFFERENTIAL (CANCER CENTER ONLY)
BASO#: 0 10*3/uL (ref 0.0–0.2)
EOS%: 4.9 % (ref 0.0–7.0)
Eosinophils Absolute: 0.2 10*3/uL (ref 0.0–0.5)
HCT: 40.9 % (ref 34.8–46.6)
HGB: 13.7 g/dL (ref 11.6–15.9)
LYMPH#: 1.4 10*3/uL (ref 0.9–3.3)
MCHC: 33.6 g/dL (ref 32.0–36.0)
NEUT#: 1.8 10*3/uL (ref 1.5–6.5)
NEUT%: 49.6 % (ref 39.6–80.0)
RBC: 4.38 10*6/uL (ref 3.70–5.32)

## 2008-06-18 LAB — COMPREHENSIVE METABOLIC PANEL
Albumin: 4.2 g/dL (ref 3.5–5.2)
CO2: 27 mEq/L (ref 19–32)
Glucose, Bld: 87 mg/dL (ref 70–99)
Potassium: 4.3 mEq/L (ref 3.5–5.3)
Sodium: 144 mEq/L (ref 135–145)
Total Protein: 6.6 g/dL (ref 6.0–8.3)

## 2008-06-18 LAB — VITAMIN D 25 HYDROXY (VIT D DEFICIENCY, FRACTURES): Vit D, 25-Hydroxy: 42 ng/mL (ref 30–89)

## 2008-06-23 ENCOUNTER — Encounter: Admission: RE | Admit: 2008-06-23 | Discharge: 2008-06-23 | Payer: Self-pay | Admitting: Hematology & Oncology

## 2008-07-21 ENCOUNTER — Ambulatory Visit: Payer: Self-pay | Admitting: Hematology & Oncology

## 2008-07-22 LAB — PROTIME-INR (CHCC SATELLITE): Protime: 30 Seconds — ABNORMAL HIGH (ref 10.6–13.4)

## 2008-08-17 ENCOUNTER — Ambulatory Visit: Payer: Self-pay | Admitting: Diagnostic Radiology

## 2008-08-17 ENCOUNTER — Ambulatory Visit (HOSPITAL_BASED_OUTPATIENT_CLINIC_OR_DEPARTMENT_OTHER): Admission: RE | Admit: 2008-08-17 | Discharge: 2008-08-17 | Payer: Self-pay | Admitting: Hematology & Oncology

## 2008-08-17 LAB — PROTIME-INR (CHCC SATELLITE)
INR: 2.4 (ref 2.0–3.5)
Protime: 28.8 Seconds — ABNORMAL HIGH (ref 10.6–13.4)

## 2008-09-15 ENCOUNTER — Ambulatory Visit: Payer: Self-pay | Admitting: Hematology & Oncology

## 2008-09-16 LAB — CBC WITH DIFFERENTIAL (CANCER CENTER ONLY)
BASO#: 0 10*3/uL (ref 0.0–0.2)
BASO%: 1 % (ref 0.0–2.0)
EOS%: 6.8 % (ref 0.0–7.0)
HCT: 41.7 % (ref 34.8–46.6)
HGB: 14.4 g/dL (ref 11.6–15.9)
LYMPH#: 1.6 10*3/uL (ref 0.9–3.3)
MONO#: 0.3 10*3/uL (ref 0.1–0.9)
NEUT#: 2 10*3/uL (ref 1.5–6.5)
NEUT%: 46.7 % (ref 39.6–80.0)
RDW: 11.4 % (ref 10.5–14.6)
WBC: 4.2 10*3/uL (ref 3.9–10.0)

## 2008-09-16 LAB — PROTIME-INR (CHCC SATELLITE): INR: 2.5 (ref 2.0–3.5)

## 2008-09-17 LAB — COMPREHENSIVE METABOLIC PANEL
AST: 28 U/L (ref 0–37)
Albumin: 4.3 g/dL (ref 3.5–5.2)
BUN: 15 mg/dL (ref 6–23)
Calcium: 9.8 mg/dL (ref 8.4–10.5)
Chloride: 104 mEq/L (ref 96–112)
Potassium: 4.4 mEq/L (ref 3.5–5.3)

## 2008-09-17 LAB — LIPID PANEL
Cholesterol: 237 mg/dL — ABNORMAL HIGH (ref 0–200)
LDL Cholesterol: 148 mg/dL — ABNORMAL HIGH (ref 0–99)
VLDL: 29 mg/dL (ref 0–40)

## 2008-10-13 LAB — PROTIME-INR (CHCC SATELLITE): Protime: 33.6 Seconds — ABNORMAL HIGH (ref 10.6–13.4)

## 2008-11-17 ENCOUNTER — Ambulatory Visit: Payer: Self-pay | Admitting: Hematology & Oncology

## 2008-11-18 LAB — PROTIME-INR (CHCC SATELLITE)
INR: 1.8 — ABNORMAL LOW (ref 2.0–3.5)
Protime: 21.6 Seconds — ABNORMAL HIGH (ref 10.6–13.4)

## 2008-12-14 ENCOUNTER — Ambulatory Visit (HOSPITAL_BASED_OUTPATIENT_CLINIC_OR_DEPARTMENT_OTHER): Admission: RE | Admit: 2008-12-14 | Discharge: 2008-12-14 | Payer: Self-pay | Admitting: Hematology & Oncology

## 2008-12-14 ENCOUNTER — Ambulatory Visit: Payer: Self-pay | Admitting: Diagnostic Radiology

## 2008-12-16 LAB — PROTIME-INR (CHCC SATELLITE): Protime: 30 Seconds — ABNORMAL HIGH (ref 10.6–13.4)

## 2008-12-16 LAB — CBC WITH DIFFERENTIAL (CANCER CENTER ONLY)
BASO%: 0.3 % (ref 0.0–2.0)
EOS%: 4.6 % (ref 0.0–7.0)
LYMPH#: 1.4 10*3/uL (ref 0.9–3.3)
MCHC: 35.1 g/dL (ref 32.0–36.0)
MONO#: 0.3 10*3/uL (ref 0.1–0.9)
NEUT#: 2.2 10*3/uL (ref 1.5–6.5)
Platelets: 195 10*3/uL (ref 145–400)
RDW: 11.3 % (ref 10.5–14.6)
WBC: 4.1 10*3/uL (ref 3.9–10.0)

## 2008-12-17 LAB — COMPREHENSIVE METABOLIC PANEL
Albumin: 4.3 g/dL (ref 3.5–5.2)
Alkaline Phosphatase: 63 U/L (ref 39–117)
BUN: 17 mg/dL (ref 6–23)
Creatinine, Ser: 0.64 mg/dL (ref 0.40–1.20)
Glucose, Bld: 92 mg/dL (ref 70–99)
Total Bilirubin: 0.6 mg/dL (ref 0.3–1.2)

## 2008-12-17 LAB — VITAMIN D 25 HYDROXY (VIT D DEFICIENCY, FRACTURES): Vit D, 25-Hydroxy: 88 ng/mL (ref 30–89)

## 2008-12-22 IMAGING — CT NM PET TUM IMG SKULL BASE T - THIGH
1 of 6 series · 1 of 25 positions shown · IV contrast ([ID])
Comparison: None

CLINICAL DATA: Recurrent breast cancer

NUCLEAR MEDICINE PET SKULL BASE TO THIGH
TECHNIQUE: 16.4 mCi F-18 FDG was injected intravenously via the
the right antecubital fossa..  Full-ring PET imaging was performed
from the skull base through the mid-thighs 53.  minutes after
injection.  CT data was obtained and used for attenuation
correction and anatomic localization only.  (This was not acquired
as a diagnostic CT examination.)
Fasting Blood Glucose:  126

[Series 2: ct images · axial · 3.8mm · 0.98mm/px · 1 of 267 slices shown]
[im 267/267  brain]
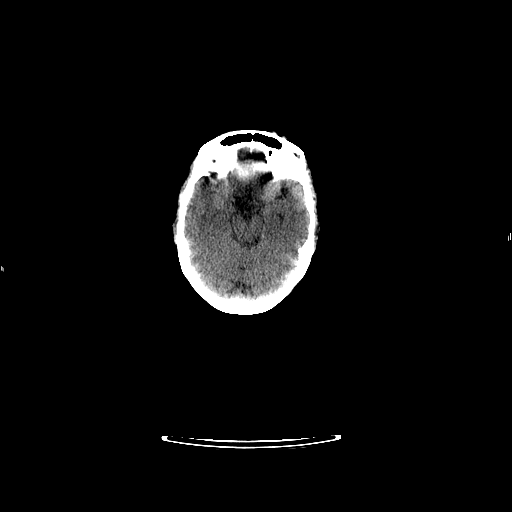

[1 of 25 positions shown; findings below may reference images not displayed]

FINDINGS: There is no hypermetabolic lymph nodes within the soft
tissues of the neck.

Postoperative changes within bilateral chest wall is identified
with placement of breast implant/tissue expanders.  Within the left
breast there has been latissimas dorsi flap reconstruction.

There is no hypermetabolic axillary or supraclavicular lymph nodes.

No hypermetabolic mediastinal or hilar lymph nodes are noted.

No pericardial or pleural effusion is noted.

There are no suspicious hypermetabolic pulmonary nodules or masses
noted

There is no abnormal FDG uptake identified within the liver
parenchyma.

Both of the adrenal glands are negative.

The pancreas has a normal appearance

The spleen is normal.

There is no hypermetabolic retroperitoneal, or small bowel
mesenteric lymph nodes.

No hypermetabolic pelvic, and or inguinal lymph nodes are noted.

There is no lytic or sclerotic lesions identified.

There is no specific evidence for hypermetabolic bone metastases.
IMPRESSION: 1.  There are no specific features to suggest hypermetabolic
metastasis.
2.  Postoperative changes from left breast reconstruction and right
breast prothesis placement noted.

## 2009-02-16 ENCOUNTER — Ambulatory Visit: Payer: Self-pay | Admitting: Hematology & Oncology

## 2009-04-13 ENCOUNTER — Ambulatory Visit: Payer: Self-pay | Admitting: Hematology & Oncology

## 2009-04-14 ENCOUNTER — Ambulatory Visit (HOSPITAL_BASED_OUTPATIENT_CLINIC_OR_DEPARTMENT_OTHER): Admission: RE | Admit: 2009-04-14 | Discharge: 2009-04-14 | Payer: Self-pay | Admitting: Hematology & Oncology

## 2009-04-14 ENCOUNTER — Ambulatory Visit: Payer: Self-pay | Admitting: Diagnostic Radiology

## 2009-04-14 LAB — CMP (CANCER CENTER ONLY)
BUN, Bld: 15 mg/dL (ref 7–22)
CO2: 31 mEq/L (ref 18–33)
Calcium: 10 mg/dL (ref 8.0–10.3)
Chloride: 102 mEq/L (ref 98–108)
Creat: 0.7 mg/dl (ref 0.6–1.2)

## 2009-04-14 LAB — CBC WITH DIFFERENTIAL (CANCER CENTER ONLY)
BASO#: 0 10*3/uL (ref 0.0–0.2)
HCT: 40.8 % (ref 34.8–46.6)
HGB: 13.7 g/dL (ref 11.6–15.9)
LYMPH#: 1.6 10*3/uL (ref 0.9–3.3)
LYMPH%: 39.6 % (ref 14.0–48.0)
MCHC: 33.6 g/dL (ref 32.0–36.0)
MCV: 95 fL (ref 81–101)
MONO#: 0.3 10*3/uL (ref 0.1–0.9)
NEUT%: 45.4 % (ref 39.6–80.0)
WBC: 4 10*3/uL (ref 3.9–10.0)

## 2009-04-14 LAB — PROTHROMBIN TIME
INR: 1.96 — ABNORMAL HIGH (ref <1.50)
Prothrombin Time: 22.2 seconds — ABNORMAL HIGH (ref 11.6–15.2)

## 2009-04-14 LAB — CANCER ANTIGEN 27.29: CA 27.29: 22 U/mL (ref 0–39)

## 2009-04-14 LAB — VITAMIN D 25 HYDROXY (VIT D DEFICIENCY, FRACTURES): Vit D, 25-Hydroxy: 38 ng/mL (ref 30–89)

## 2009-06-07 ENCOUNTER — Ambulatory Visit: Payer: Self-pay | Admitting: Hematology & Oncology

## 2009-06-08 LAB — PROTIME-INR (CHCC SATELLITE)

## 2009-07-28 ENCOUNTER — Ambulatory Visit: Payer: Self-pay | Admitting: Hematology & Oncology

## 2009-08-01 LAB — CMP (CANCER CENTER ONLY)
ALT(SGPT): 21 U/L (ref 10–47)
Albumin: 3.9 g/dL (ref 3.3–5.5)
CO2: 30 mEq/L (ref 18–33)
Glucose, Bld: 105 mg/dL (ref 73–118)
Potassium: 4.4 mEq/L (ref 3.3–4.7)
Sodium: 141 mEq/L (ref 128–145)
Total Bilirubin: 0.9 mg/dl (ref 0.20–1.60)
Total Protein: 6.7 g/dL (ref 6.4–8.1)

## 2009-08-01 LAB — CBC WITH DIFFERENTIAL (CANCER CENTER ONLY)
BASO#: 0 10*3/uL (ref 0.0–0.2)
BASO%: 0.6 % (ref 0.0–2.0)
Eosinophils Absolute: 0.3 10*3/uL (ref 0.0–0.5)
HCT: 40.4 % (ref 34.8–46.6)
HGB: 13.9 g/dL (ref 11.6–15.9)
LYMPH#: 1.4 10*3/uL (ref 0.9–3.3)
LYMPH%: 40.9 % (ref 14.0–48.0)
MCV: 94 fL (ref 81–101)
MONO#: 0.2 10*3/uL (ref 0.1–0.9)
NEUT%: 43.8 % (ref 39.6–80.0)
RBC: 4.32 10*6/uL (ref 3.70–5.32)
WBC: 3.4 10*3/uL — ABNORMAL LOW (ref 3.9–10.0)

## 2009-08-01 LAB — CANCER ANTIGEN 27.29: CA 27.29: 20 U/mL (ref 0–39)

## 2009-08-01 LAB — PROTIME-INR (CHCC SATELLITE)

## 2009-08-08 ENCOUNTER — Ambulatory Visit: Payer: Self-pay | Admitting: Diagnostic Radiology

## 2009-08-08 ENCOUNTER — Ambulatory Visit (HOSPITAL_BASED_OUTPATIENT_CLINIC_OR_DEPARTMENT_OTHER): Admission: RE | Admit: 2009-08-08 | Discharge: 2009-08-08 | Payer: Self-pay | Admitting: Hematology & Oncology

## 2009-08-16 ENCOUNTER — Encounter: Admission: RE | Admit: 2009-08-16 | Discharge: 2009-08-16 | Payer: Self-pay | Admitting: Obstetrics and Gynecology

## 2009-09-09 ENCOUNTER — Encounter: Admission: RE | Admit: 2009-09-09 | Discharge: 2009-09-09 | Payer: Self-pay | Admitting: General Surgery

## 2009-09-09 ENCOUNTER — Ambulatory Visit (HOSPITAL_BASED_OUTPATIENT_CLINIC_OR_DEPARTMENT_OTHER): Admission: RE | Admit: 2009-09-09 | Discharge: 2009-09-09 | Payer: Self-pay | Admitting: General Surgery

## 2009-10-25 ENCOUNTER — Ambulatory Visit: Payer: Self-pay | Admitting: Hematology & Oncology

## 2009-12-22 ENCOUNTER — Ambulatory Visit: Payer: Self-pay | Admitting: Hematology & Oncology

## 2009-12-23 ENCOUNTER — Ambulatory Visit (HOSPITAL_BASED_OUTPATIENT_CLINIC_OR_DEPARTMENT_OTHER)
Admission: RE | Admit: 2009-12-23 | Discharge: 2009-12-23 | Payer: Self-pay | Source: Home / Self Care | Attending: Hematology & Oncology | Admitting: Hematology & Oncology

## 2009-12-23 LAB — CBC WITH DIFFERENTIAL (CANCER CENTER ONLY)
BASO%: 0.6 % (ref 0.0–2.0)
EOS%: 4.9 % (ref 0.0–7.0)
HCT: 41.7 % (ref 34.8–46.6)
LYMPH%: 29.4 % (ref 14.0–48.0)
MCH: 31.6 pg (ref 26.0–34.0)
MCHC: 33.8 g/dL (ref 32.0–36.0)
MCV: 93 fL (ref 81–101)
MONO#: 0.3 10*3/uL (ref 0.1–0.9)
NEUT%: 58.1 % (ref 39.6–80.0)
RDW: 11.4 % (ref 10.5–14.6)

## 2009-12-23 LAB — CMP (CANCER CENTER ONLY)
ALT(SGPT): 55 U/L — ABNORMAL HIGH (ref 10–47)
CO2: 30 mEq/L (ref 18–33)
Creat: 0.5 mg/dl — ABNORMAL LOW (ref 0.6–1.2)
Total Bilirubin: 1.1 mg/dl (ref 0.20–1.60)

## 2009-12-24 LAB — PROTHROMBIN TIME
INR: 1.78 — ABNORMAL HIGH (ref <1.50)
Prothrombin Time: 20.9 seconds — ABNORMAL HIGH (ref 11.6–15.2)

## 2010-01-29 ENCOUNTER — Encounter: Payer: Self-pay | Admitting: Obstetrics and Gynecology

## 2010-01-29 ENCOUNTER — Encounter: Payer: Self-pay | Admitting: Hematology & Oncology

## 2010-03-17 ENCOUNTER — Other Ambulatory Visit: Payer: Self-pay | Admitting: Hematology & Oncology

## 2010-03-17 ENCOUNTER — Encounter (HOSPITAL_BASED_OUTPATIENT_CLINIC_OR_DEPARTMENT_OTHER): Payer: Medicare Other | Admitting: Hematology & Oncology

## 2010-03-17 DIAGNOSIS — I2699 Other pulmonary embolism without acute cor pulmonale: Secondary | ICD-10-CM

## 2010-03-17 DIAGNOSIS — Z7901 Long term (current) use of anticoagulants: Secondary | ICD-10-CM

## 2010-03-17 DIAGNOSIS — C50419 Malignant neoplasm of upper-outer quadrant of unspecified female breast: Secondary | ICD-10-CM

## 2010-03-17 DIAGNOSIS — C50919 Malignant neoplasm of unspecified site of unspecified female breast: Secondary | ICD-10-CM

## 2010-03-17 DIAGNOSIS — E559 Vitamin D deficiency, unspecified: Secondary | ICD-10-CM

## 2010-03-17 LAB — COMPREHENSIVE METABOLIC PANEL
ALT: 21 U/L (ref 0–35)
AST: 21 U/L (ref 0–37)
Chloride: 102 mEq/L (ref 96–112)
Creatinine, Ser: 0.73 mg/dL (ref 0.40–1.20)
Sodium: 141 mEq/L (ref 135–145)
Total Bilirubin: 0.7 mg/dL (ref 0.3–1.2)
Total Protein: 6.5 g/dL (ref 6.0–8.3)

## 2010-03-17 LAB — CBC WITH DIFFERENTIAL (CANCER CENTER ONLY)
BASO#: 0 10*3/uL (ref 0.0–0.2)
Eosinophils Absolute: 0.1 10*3/uL (ref 0.0–0.5)
HCT: 41.2 % (ref 34.8–46.6)
HGB: 14.1 g/dL (ref 11.6–15.9)
LYMPH%: 40.9 % (ref 14.0–48.0)
MCH: 31.1 pg (ref 26.0–34.0)
MCV: 91 fL (ref 81–101)
MONO#: 0.4 10*3/uL (ref 0.1–0.9)
Platelets: 190 10*3/uL (ref 145–400)
RBC: 4.53 10*6/uL (ref 3.70–5.32)
WBC: 3.5 10*3/uL — ABNORMAL LOW (ref 3.9–10.0)

## 2010-03-17 LAB — VITAMIN D 25 HYDROXY (VIT D DEFICIENCY, FRACTURES): Vit D, 25-Hydroxy: 60 ng/mL (ref 30–89)

## 2010-03-17 LAB — PROTIME-INR (CHCC SATELLITE): INR: 2.8 (ref 2.0–3.5)

## 2010-03-23 LAB — URINALYSIS, ROUTINE W REFLEX MICROSCOPIC
Bilirubin Urine: NEGATIVE
Glucose, UA: NEGATIVE mg/dL
Hgb urine dipstick: NEGATIVE
Specific Gravity, Urine: 1.019 (ref 1.005–1.030)
Urobilinogen, UA: 0.2 mg/dL (ref 0.0–1.0)
pH: 7.5 (ref 5.0–8.0)

## 2010-03-23 LAB — COMPREHENSIVE METABOLIC PANEL
ALT: 24 U/L (ref 0–35)
Alkaline Phosphatase: 66 U/L (ref 39–117)
BUN: 10 mg/dL (ref 6–23)
CO2: 30 mEq/L (ref 19–32)
Chloride: 105 mEq/L (ref 96–112)
Glucose, Bld: 97 mg/dL (ref 70–99)
Potassium: 4.1 mEq/L (ref 3.5–5.1)
Sodium: 139 mEq/L (ref 135–145)
Total Bilirubin: 0.9 mg/dL (ref 0.3–1.2)

## 2010-03-23 LAB — DIFFERENTIAL
Basophils Absolute: 0 10*3/uL (ref 0.0–0.1)
Basophils Relative: 1 % (ref 0–1)
Eosinophils Absolute: 0.2 10*3/uL (ref 0.0–0.7)
Monocytes Relative: 11 % (ref 3–12)
Neutro Abs: 1.6 10*3/uL — ABNORMAL LOW (ref 1.7–7.7)
Neutrophils Relative %: 47 % (ref 43–77)

## 2010-03-23 LAB — CBC
HCT: 41.1 % (ref 36.0–46.0)
Hemoglobin: 14 g/dL (ref 12.0–15.0)
MCV: 93.2 fL (ref 78.0–100.0)
RBC: 4.41 MIL/uL (ref 3.87–5.11)
WBC: 3.3 10*3/uL — ABNORMAL LOW (ref 4.0–10.5)

## 2010-03-23 LAB — PROTIME-INR: INR: 1.06 (ref 0.00–1.49)

## 2010-03-23 LAB — APTT: aPTT: 27 seconds (ref 24–37)

## 2010-05-23 NOTE — Discharge Summary (Signed)
[name], English                  ACCOUNT NO.:  [id number]   MEDICAL RECORD NO.:  [id number]          PATIENT TYPE:  INP   LOCATION:  1416                         FACILITY:  [facility]   PHYSICIAN:  Hollice Espy, M.D.DATE OF BIRTH:  April 14, 1945   DATE OF ADMISSION:  06/24/2007  DATE OF DISCHARGE:  06/26/2007                               DISCHARGE SUMMARY   PRIMARY CARE PHYSICIAN:  Pam Drown, M.D.   CONSULTANTS:  1. Dr. Rose Phi. Ennever, oncology.  2. Dr. Odis Luster, plastic surgery.   DISCHARGE DIAGNOSES:  1. Bilateral pulmonary emboli.  2. History of breast cancer, status post left breast reconstruction      recently.   DISCHARGE MEDICATIONS:  1. The patient is being discharged on Arixtra 7.5 mg subcu daily.  The      first dose will be given at Dr. Otilio Connors office as soon as      the patient is discharged from the hospital.  She will then be      given a prescription for Arixtra by Dr. Myna Hidalgo, and then she will      go home.  The home health nurse with follow up and help her      administer the Arixtra starting tomorrow, June 27, 2007.  2. She will also be continued on Coumadin.  She was started on a dose      of 7.5 mg p.o. nightly.  She will receive her next dose early this      afternoon prior to discharge and then will start starting tomorrow      night on June 27, 2007.  Coumadin levels will be followed by Dr.      Arlan Organ, levels adjusted.  3. The patient will continue on her home dose of Allegra p.r.n.   HOSPITAL COURSE:  The patient is a 65 year old white female with a past  medical history of breast cancer with recurrence, status post bilateral  mastectomies and recent reconstruction surgery, who presented to the  emergency room after she was found to have recently diagnosed pulmonary  emboli from her chest CT which was done to stage her cancer.  The  patient was admitted and started on IV heparin.  She seemed to be doing  well.  She had no signs  of hypoxia or pain.  Oncology followed up with  the patient.  The patient was immediately started on Coumadin, as well  as continued on heparin.  She initially has remained stable at low dose  of 5 mg, and this has been increased to 7.5 mg.  Currently, the patient  is quite subtherapeutic with her INR being at 1.1 at the time of  discharge.  Dr. Myna Hidalgo recommended that the patient because she needed  a follow up consultation at [facility], an appointment has been scheduled for  June 27, 2007.  It is felt that the patient needs to keep this  appointment.  Therefore, Dr. Myna Hidalgo has recommended that the patient be  discharged to home on June 26, 2007, with Arixtra to provide her  temporary anticoagulation coverage until  her Coumadin level is  therapeutic.  After discussion with Dr. Myna Hidalgo, he will follow her INR  levels and make adjustments accordingly.  In regards to the patient's  surgery, status post reconstruction, again, she has a follow up  appointment with [facility] for her breast cancer.  In regards to her  reconstruction surgery, Dr. Odis Luster followed up on the patient while in  the hospital.  He placed on Ace wrap on June 26, 2007.  The surgical  sites were reported to be looking good, and the patient will follow up  with Dr. Odis Luster on Monday by phone to report any problems with drainage.   DISPOSITION:  Upon initial presentation is the same.   DISCHARGE DIET:  Regular diet, although she is advised to avoid leafy  green vegetables.   ACTIVITY:  Slowly increased, and she was advised to be cautious in terms  of avoiding accidents that could run the risk of bleeding.   The patient is being discharged to home.  She will follow up with Dr.  Myna Hidalgo as scheduled, as well as Dr. Corliss Blacker.  Please note, a 2-D echo  was ordered to evaluate for signs of right ventricular strain from  emboli.  This echo at the time of discharge is pending and will be  followed by Dr. Myna Hidalgo and Dr. Corliss Blacker  postdischarge.      Hollice Espy, M.D.  Electronically Signed     SKK/MEDQ  D:  06/26/2007  T:  06/26/2007  Job:  [id number]   cc:   Rose Phi. Myna Hidalgo, M.D.  Fax: [phone]   Pam Drown, M.D.  Fax: [phone]   Etter Sjogren, M.D.  Fax: [phone]

## 2010-05-23 NOTE — H&P (Signed)
NAMEAVEREE, Margaret English   MEDICAL RECORD NO.:  [id number]          PATIENT TYPE:  EMS   LOCATION:  ED                           FACILITY:  [facility]   PHYSICIAN:  Michiel Cowboy, MDDATE OF BIRTH:  1945/10/05   DATE OF ADMISSION:  06/24/2007  DATE OF DISCHARGE:                              HISTORY & PHYSICAL   PRIMARY CARE Xiomar Crompton:  Dr. Uvaldo Rising.   HEMATOLOGIST/ONCOLOGIST:  Rose Phi. Myna Hidalgo, M.D.   CHIEF COMPLAINT:  Abnormal CT scan.   Patient is a pleasant 65 year old female with a history of breast cancer  with recurrence, status post bilateral mastectomies.  Presented to the  emergency department after receiving a phone call from her hematologist  that patient has a newly diagnosed PE.  Patient had undergone recent PET  scan and CT scan to stage her cancer.  Reviewed PET scan and CT scan  showed no evidence of metastatic disease but did show bilateral PE's  with extension into bilateral lower lobe pulmonariae and right middle  lobe pulmonary artery as well as into right upper lobe pulmonary artery.  The patient was otherwise completely asymptomatic.  She had undergone  her mastectomy in [place], and since then, per her surgeon's orders, had  limited her mobility.  She did have a plane trip to [place] after her  mastectomy, which was also the end of May, and also had a low grade  fever two days ago but other than that, had absolutely no symptoms and  completely no chest pain, no shortness of breath.  Feeling well,  although continues to lose weight.   Otherwise, review of systems negative except for as stated in HPI.  No  bleeding, no tarry stools.   PAST MEDICAL HISTORY:  Breast cancer with recurrence, status post left  breast reconstruction with latissimus myocutaneous flap and silicone gel  implant, right breast reconstruction with placement of tissue expander.   SOCIAL HISTORY:  Patient does not smoke, drink, or use drugs.  Lives at  home with her husband.   FAMILY HISTORY:  Noncontributory.   ALLERGIES:  No known drug allergies.   MEDICATIONS:  Patient currently only taking Allegra and did take a dose  of Keflex a couple of days ago when she had a fever.   PHYSICAL EXAMINATION:  VITALS:  Temperature 97.8, blood pressure 146/72,  pulse 92, respirations 18, satting 99% on room air.  Patient appears to be in no acute distress, lying down on a stretcher.  LUNGS:  Clear to auscultation bilaterally.  HEART:  Regular rate and rhythm with no murmurs, rubs or gallops.  CHEST:  Two incisions noted with Steri-Strips.  No drainage noted.  ABDOMEN:  Soft, nontender, nondistended.  LOWER EXTREMITIES:  No edema.  NEUROLOGIC:  Nonfocal.   LABS:  White blood cell count 6.1, hemoglobin 12.2, platelets 293.  Potassium 4.1, sodium 139, creatinine 0.66.  LFTs within normal limits  with the exception of albumin 2.9.   CAT scan showed no evidence of metastasis.   CT scan shows no evidence of metastasis in the head, abdomen, chest,  and  pelvis but did show bilateral pulmonary emboli, as described above.   ASSESSMENT:  This is a 65 year old female with breast cancer as a risk  factor and recent surgery on a long plane trip, who was admitted with  pulmonary emboli, bilateral.   PLAN:  1. Bilateral pulmonary emboli:  Given the high clot burden, will put      on heparin IV.  Would start Coumadin tomorrow.  For risk      stratification, will obtain troponin, 2D echo, given the high      burden of clot.  Will evaluate for right-sided strain, although the      patient is hemodynamically stable.  Will start on IV fluids to      improve preload and monitor the patient on telemetry.  2. Prophylaxis:  Protonix 40 mg p.o. daily.  She is going to be on      heparin. Of note, the patient has an appointment at [facility] that she      wishes to attend, and this is on Friday.  Discussed with the      patient the possibility of going home on  Lovenox.  Will defer to      Dr. Rito Ehrlich if he feels comfortable with that.  Would at least      keep on observation for at least 48 hours or so.  3. Remote history of fever:  Will check UA plus urine culture.  4. History of breast cancer:  Stable.      Michiel Cowboy, MD  Electronically Signed     AVD/MEDQ  D:  06/24/2007  T:  06/24/2007  Job:  [id number]   cc:   Pam Drown, M.D.  Fax: [phone]   Rose Phi. Myna Hidalgo, M.D.  Fax: [phone]

## 2010-05-23 NOTE — Op Note (Signed)
Margaret English, Margaret English NO.:  [id number]   MEDICAL RECORD NO.:  [id number]          PATIENT TYPE:  INP   LOCATION:  5149                         FACILITY:  [facility]   PHYSICIAN:  Etter Sjogren, M.D.     DATE OF BIRTH:  1945-11-28   DATE OF PROCEDURE:  06/03/2007  DATE OF DISCHARGE:                               OPERATIVE REPORT   PREOPERATIVE DIAGNOSIS:  Breast cancer.   SPECIMENS:  Breast cancer.   PROCEDURE:  1. Left breast reconstruction with latissimus myocutaneous flap.  2. Left breast reconstruction with silicone gel implant placed beneath      the latissimus flap.  3. Right breast reconstruction with tissue expander.  4. Placement of HD Flex hydrated allograft to complete right breast      reconstruction with tissue expander.   SURGEON:  Etter Sjogren, MD   ASSISTANTS:  1. Pleas Patricia, MD for latissimus flap and silicone gel implant,      left side.  2. Magnus Ivan, RNFA for the right tissue expander.   ANESTHESIA:  General.   ESTIMATED BLOOD LOSS:  25 mL.   DRAINS:  Two Blakes to right chest, two Blakes to left chest, two Blake  drains for the posterior chest donor site of latissimus muscle.   CLINICAL NOTE:  This is a 65 year old woman who has had breast cancer on  the left, had lumpectomy and radiation treatment, and recently failed  that, and developed a breast cancer on the left side.  She desires  reconstruction.  She also was planning a bilateral mastectomy.  The  reconstruction were discussed with her in great detail.  Because of the  radiation, she needed some of her own tissue utilized and this was  discussed with her and she preferred to use latissimus flap with  silicone gel implant rather than the TRAM flap.  She did preferred the  silicone implant as opposed to saline.  On the other side, she elected  to use a tissue expander.  Options were discussed including the  possibility of a latissimus flap on that side as a delayed  procedure at  later time and she understood that, but declined that and just preferred  to go ahead with the tissue expander.  She clearly understood the risks  and possible complications, overall convalescence.  Risks included but  were not limited to bleeding, infection, anesthesia complications,  healing problems, scarring, loss of sensation, loss of the flap and  failure of the tissue expander to function properly, failure of the  tissue expander, failure of the implant, wound healing problems  associated with the previous radiation, and she has had persistent  drainage of fluid collections, and the fact that there would be a  significant asymmetry because different techniques were being utilized  and she had had previous radiation on the left.  Having discussed all  this, she wished to proceed.   PROCEDURE:  The patient was placed in a standing position in the holding  area and marked for the latissimus flap and inframammary crease marked  bilaterally as well.  She  was then taken to the operating room and Dr.  Derrell Lolling performed bilateral mastectomy.  At conclusion of that, the right  breast was addressed first.  The dissection was carried out beneath the  pectoralis major muscle and beneath the serratus anterior laterally.  Great care was taken to avoid damage to right chest cavity.  HD flex was  positioned inferiorly to bridge the gap between the muscle and the  inframammary crease and it was secured with 3-0 PDS suture.  Care was  taken to place this HD Flex in position with the dermal side up towards  the mastectomy flaps.  The tissue expander was prepared after thoroughly  cleaning gloves.  This was a Mentor 550 mL implant, lot #[id number], 150 mL  sterile saline placed, all the air removed.  It was soaked in antibiotic  solution.  Saline and antibiotic solution were used to irrigate the  chest.  Excellent hemostasis was confirmed.  The tissue expander was  positioned.  Care was  taken to make sure it was in the inferior aspect  of the submuscular pocket and the insetting of HD Flex was completed.  A  small opening was left superolaterally in order for fluid to drain from  there without placing a drain and direct contact with the underlying  tissue expander.  Blake down to position, brought through separate stab  wounds inferolaterally, and secured with 3-0 Prolene suture.  The  remainder of the closure was with 3-0 Monocryl interrupted buried deep  dermal sutures and running 3-0 Monocryl subcuticular suture.  Steri-  Strips, dry sterile dressing, and a sterile OpSite applied.   The patient was then placed in the right lateral decubitus after closing  the left chest with skin staples and placing a large OpSite over it as  well.  She was placed in right lateral decubitus with axillary roll and  when she was in right lateral decubitus, she was prepped with Betadine  and draped with sterile drapes after removing the sterile OpSite.  The  flap was designed and checked.  It was adjusted slightly and incision  was made and the skin paddle kept intact over the muscle and the  dissection carried down to the underlying latissimus muscle and anterior  border identified.  Dissection carried out anteriorly and then deep to  the muscle taking great care to avoid damage to the pedicle.  Some  enlarged perforating vessels back towards the lumbar region were double  ligated with layer clips and an occasional 2-0 silk suture and were  divided.  The remainder of the muscle was mobilized and the flap was  passed through the subcutaneous tunnel to the left chest.  We had  excellent color and bright red bleeding around its periphery consistent  with viability.  Thorough irrigation with saline.  Excellent hemostasis  with electrocautery.  Two Blake drains positioned brought out through  separate stab wounds inferoanteriorly and secured with 3-0 Prolene  sutures.  The On-Q pain  catheters were also positioned as well and  secured with skin with Steri-Strips.  The closure with 0 PDS interrupted  buried deep sutures, 3-0 Monocryl inverted deep dermal sutures, and 3-0  Monocryl running subcuticular suture.  Steri-Strips, dry sterile  dressing, and a sterile Tegaderm was used to secure all of this in place  with an occlusive dressing and the patient was then turned supine.   The mastectomy site on the left had been covered with a sterile towel  for the movement back  onto her back.  This was now carefully removed and  she was prepped with Betadine and draped with sterile drapes.  Skin  staples were then removed from the wound.  The flap was inspected.  Excellent color.  Thorough irrigation with saline, thorough irrigation  with antibiotic solution.  Excellent hemostasis was confirmed.  The  latissimus muscle was inset with 3-0 Vicryl interrupted figure-of-eight  sutures, and after thoroughly cleaning gloves, the implant was  positioned, smooth, round, high profile silicone gel implant, 375 mL,  lot #[id number].  The implant was positioned and the 3-0 Vicryl sutures  were used to close the muscle superiorly.  Blake drains were positioned,  brought through separate stab wounds inferiorly, and secured with 3-0  Prolene sutures.  Great care was taken to make sure that there was a  good closure especially out laterally of the latissimus muscle and that  was indeed the case.  The skin paddle had excellent color and was found  to be viable.  The remainder of the closure was with 3-0 Monocryl  interrupted buried deep dermal sutures and running 3-0 Monocryl  subcuticular suture.  Steri-Strips, dry sterile dressing applied, and  she was transferred to the recovery room in stable having tolerated  procedure.     Etter Sjogren, M.D.  Electronically Signed    DB/MEDQ  D:  06/03/2007  T:  06/04/2007  Job:  [id number]

## 2010-05-23 NOTE — Discharge Summary (Signed)
[name], [name] NO.:  [id number]   MEDICAL RECORD NO.:  [id number]          PATIENT TYPE:  INP   LOCATION:  5149                         FACILITY:  [facility]   PHYSICIAN:  Etter Sjogren, M.D.     DATE OF BIRTH:  07/18/1945   DATE OF ADMISSION:  06/03/2007  DATE OF DISCHARGE:  06/06/2007                               DISCHARGE SUMMARY   FINAL DIAGNOSES:  Breast cancer.   PROCEDURE:  1. Bilateral mastectomy.  2. Left breast reconstruction with latissimus myocutaneous flap and      silicone gel implant.  3. Right breast reconstruction with placement of tissue expander.   HISTORY AND PHYSICAL:  A 65 year old woman had breast cancer in the  past, had lumpectomy and radiation on the left side and now has  recurrent breast cancer.  She was scheduled for a mastectomy.  She has  had bilateral mastectomy.  She desired breast reconstruction, and she  selected latissimus flap with implants.  She preferred silicone gel over  saline and tissue expander on the right side.  For further details of  history and physical, please see chart.   HOSPITAL COURSE:  On admission, she was taken to the surgery at which  time the bilateral mastectomy, bilateral breast reconstruction were  performed and she tolerated the procedures well.  Postoperatively, she  did well.  Urine output was excellent.  Hemoglobin stabilized on first  postoperative day at 10.9.  She is tolerating her diet.  IV fluids were  discontinued, and she will continue on IV antibiotics.  She did have  elevation of her temperature to 102 degrees on the first postoperative  day, this responded beautifully to ambulation and incentive spirometry.  She has been afebrile since that time.  Drains continue to function.  All sites look very healthy.  Flap has excellent color.  Capillary  refill 1 to 2 seconds.  She does have a little area of epidermolysis,  inferior mastectomy flap on the left side thatit is a couple of  centimeters in diameter.  Donor site looks very good.  Tissue expander  on the right looks good. She is ready to be discharged.   DISPOSITION:  Vicodin a total of 40 given one p.o. q.4 p.r.n. pain,  Robaxin 500 mg one p.o. q.12, and Keflex 500 mg p.o. q.i.d. for 3 days.  She  amount, she will call us in 5 days to report the amount of drainage  and based on that we will decide about her followup appointment.  We  will see her back sooner if there are any questions or concerns.      Etter Sjogren, M.D.  Electronically Signed     DB/MEDQ  D:  06/06/2007  T:  06/07/2007  Job:  [id number]

## 2010-05-23 NOTE — Op Note (Signed)
NAMEDARELY, BECKNELL                  ACCOUNT NO.:  [id number]   MEDICAL RECORD NO.:  [id number]          PATIENT TYPE:  INP   LOCATION:  5149                         FACILITY:  [facility]   PHYSICIAN:  Angelia Mould. Derrell Lolling, M.D.DATE OF BIRTH:  [birth year]   DATE OF PROCEDURE:  06/03/2007  DATE OF DISCHARGE:                               OPERATIVE REPORT   PREOPERATIVE DIAGNOSIS:  Recurrent invasive mammary carcinoma, left  breast.   POSTOPERATIVE DIAGNOSIS:  Recurrent invasive mammary carcinoma, left  breast.   OPERATION PERFORMED:  1. Left total mastectomy.  2. Prophylactic right total mastectomy.   SURGEON:  Angelia Mould. Derrell Lolling, MD.   FIRST ASSISTANT:  Lorne Skeens. Hoxworth, MD   OPERATIVE INDICATIONS:  This is a 65 year old white female, who  underwent left partial mastectomy and left axillary lymph node  dissection by Dr. Francina Ames in December 2005.  She had invasive  mammary carcinoma measuring 2.4 cm with multifocal disease but localized  in that quadrant and she had one node positive for metastatic carcinoma  out of a total of 23 lymph nodes.  She apparently received postoperative  radiation therapy and postoperative chemotherapy and her port was  removed.  Recent mammogram show a new 4-cm area of microcalcifications  in the left breast in the upper outer quadrant near and posterior to the  lumpectomy site.  Biopsy showed high-grade invasive carcinoma which was  receptor negative.  The right breast mammogram showed benign  calcifications.  She informed her physicians that she wanted bilateral  mastectomy and so an MRI was not scheduled.  I evaluated her on April 30, 2007.  After discussion with her, it was clear that she will wanted  a prophylactic right mastectomy, and so she was scheduled for bilateral  total mastectomies, and she saw Dr. Etter Sjogren who plans a  reconstruction with a latissimus flap on the left and a tissue expander  on the right.   OPERATIVE TECHNIQUE:   Following the induction of general endotracheal  anesthesia, the patient was positioned with her arms at her side.  The  neck, chest, upper arms, and the lateral and posterior chest were  prepped and draped in a sterile fashion.  The patient was identified as  to correct patient and correct procedure and correct site.  Intravenous  antibiotics were given.   We marked bilateral transverse elliptical incisions.  On the left side,  the lateral aspect of the incision had to go widely around the  lumpectomy site and the thickened area in the breast.  Her breasts are  large and so we had plenty of skin to work with.  The incisions were  made on the left side.  Skin flaps were raised superiorly to just below  the clavicle, medially to the parasternal area, inferiorly to the  anterior rectus sheath, and laterally to the latissimus dorsi muscle.  Laterally, the tissues were quite thickened because of radiation  therapy, chronic scarring.  I did not feel any cancerous tissue anywhere  within the dissection.  The breast was then dissected off the pectoralis  major  and pectoralis minor muscle with electrocautery.  I took the  dissection up toward the axilla, so I was sure to get the tail of  Spence.  The specimen was marked with silk sutures to orient the  pathologist and sent for routine histology.  Hemostasis was excellent  and achieved with electrocautery and a couple of metal clips.  The wound  was packed with saline gauze.   On the right side, I then made a transverse elliptical incision,  essentially a mirror image of the left side.  Skin flaps were raised  superiorly, medially, inferiorly, and laterally in identical fashion.  The breast was dissected off of the pectoralis major and pectoralis  minor muscles with electrocautery leaving the muscles intact.  We  incised the clavipectoral fascia.  We dissected the tail of Spence also.  A few vascular channels were controlled with metal clips and  divided.  This specimen was also marked with silk sutures to orient the  pathologist and sent for routine histology.  This wound was irrigated  with saline.  Hemostasis was excellent and achieved with electrocautery.   At this point, Dr. Etter Sjogren came in the operating room and assumed  care of the patient.  I discussed some of the technical issues with my  procedure including one area of being close to the skin on the inferior  flap on the left which looked viable.  At this point in time, the  patient had stable vital signs.  There has been no more than 200 mL of  blood loss, and sponge, needle, and instrument counts were correct.  Dr.  Odis Luster will dictate the reconstruction.      Angelia Mould. Derrell Lolling, M.D.  Electronically Signed     HMI/MEDQ  D:  06/03/2007  T:  06/03/2007  Job:  [id number]   cc:   Pam Drown, M.D.  Guy Sandifer Henderson Cloud, M.D.  Etter Sjogren, M.D.

## 2010-06-09 ENCOUNTER — Other Ambulatory Visit: Payer: Self-pay | Admitting: Hematology & Oncology

## 2010-06-09 ENCOUNTER — Ambulatory Visit (HOSPITAL_BASED_OUTPATIENT_CLINIC_OR_DEPARTMENT_OTHER)
Admission: RE | Admit: 2010-06-09 | Discharge: 2010-06-09 | Disposition: A | Discharge status: Home / Self Care | Payer: Medicare Other | Source: Ambulatory Visit | Attending: Hematology & Oncology | Admitting: Hematology & Oncology

## 2010-06-09 ENCOUNTER — Encounter (HOSPITAL_BASED_OUTPATIENT_CLINIC_OR_DEPARTMENT_OTHER): Payer: Medicare Other | Admitting: Hematology & Oncology

## 2010-06-09 ENCOUNTER — Ambulatory Visit (INDEPENDENT_AMBULATORY_CARE_PROVIDER_SITE_OTHER)
Admission: RE | Admit: 2010-06-09 | Discharge: 2010-06-09 | Disposition: A | Discharge status: Home / Self Care | Payer: Medicare Other | Source: Ambulatory Visit | Attending: Hematology & Oncology | Admitting: Hematology & Oncology

## 2010-06-09 DIAGNOSIS — C50419 Malignant neoplasm of upper-outer quadrant of unspecified female breast: Secondary | ICD-10-CM

## 2010-06-09 DIAGNOSIS — C50919 Malignant neoplasm of unspecified site of unspecified female breast: Secondary | ICD-10-CM

## 2010-06-09 DIAGNOSIS — M479 Spondylosis, unspecified: Secondary | ICD-10-CM

## 2010-06-09 LAB — VITAMIN D 25 HYDROXY (VIT D DEFICIENCY, FRACTURES): Vit D, 25-Hydroxy: 60 ng/mL (ref 30–89)

## 2010-06-09 LAB — CBC WITH DIFFERENTIAL (CANCER CENTER ONLY)
BASO#: 0 10*3/uL (ref 0.0–0.2)
Eosinophils Absolute: 0.3 10*3/uL (ref 0.0–0.5)
HGB: 14.6 g/dL (ref 11.6–15.9)
LYMPH%: 34.5 % (ref 14.0–48.0)
MCV: 91 fL (ref 81–101)
MONO#: 0.5 10*3/uL (ref 0.1–0.9)
NEUT#: 2.5 10*3/uL (ref 1.5–6.5)
Platelets: 198 10*3/uL (ref 145–400)
RBC: 4.63 10*6/uL (ref 3.70–5.32)
WBC: 5 10*3/uL (ref 3.9–10.0)

## 2010-06-09 LAB — CMP (CANCER CENTER ONLY)
Albumin: 3.7 g/dL (ref 3.3–5.5)
CO2: 29 mEq/L (ref 18–33)
Glucose, Bld: 99 mg/dL (ref 73–118)
Potassium: 4.3 mEq/L (ref 3.3–4.7)
Sodium: 144 mEq/L (ref 128–145)
Total Protein: 6.9 g/dL (ref 6.4–8.1)

## 2010-06-09 LAB — PROTIME-INR (CHCC SATELLITE): Protime: 24 Seconds — ABNORMAL HIGH (ref 10.6–13.4)

## 2010-06-09 MED ORDER — IOHEXOL 300 MG/ML  SOLN: 100.0000 mL | Freq: Once | INTRAMUSCULAR | Status: DC | PRN

## 2010-06-16 ENCOUNTER — Encounter (HOSPITAL_BASED_OUTPATIENT_CLINIC_OR_DEPARTMENT_OTHER): Payer: Medicare Other | Admitting: Hematology & Oncology

## 2010-06-16 DIAGNOSIS — Z853 Personal history of malignant neoplasm of breast: Secondary | ICD-10-CM

## 2010-06-16 DIAGNOSIS — I2699 Other pulmonary embolism without acute cor pulmonale: Secondary | ICD-10-CM

## 2010-06-16 DIAGNOSIS — Z7901 Long term (current) use of anticoagulants: Secondary | ICD-10-CM

## 2010-09-14 ENCOUNTER — Other Ambulatory Visit: Payer: Self-pay | Admitting: Hematology & Oncology

## 2010-09-14 ENCOUNTER — Encounter (HOSPITAL_BASED_OUTPATIENT_CLINIC_OR_DEPARTMENT_OTHER): Payer: Medicare Other | Admitting: Hematology & Oncology

## 2010-09-14 DIAGNOSIS — C50419 Malignant neoplasm of upper-outer quadrant of unspecified female breast: Secondary | ICD-10-CM

## 2010-09-14 DIAGNOSIS — Z7901 Long term (current) use of anticoagulants: Secondary | ICD-10-CM

## 2010-09-14 DIAGNOSIS — E559 Vitamin D deficiency, unspecified: Secondary | ICD-10-CM

## 2010-09-14 DIAGNOSIS — Z853 Personal history of malignant neoplasm of breast: Secondary | ICD-10-CM

## 2010-09-14 LAB — CBC WITH DIFFERENTIAL (CANCER CENTER ONLY)
BASO#: 0 10*3/uL (ref 0.0–0.2)
Eosinophils Absolute: 0.2 10*3/uL (ref 0.0–0.5)
HCT: 38.9 % (ref 34.8–46.6)
HGB: 13.6 g/dL (ref 11.6–15.9)
LYMPH%: 34.3 % (ref 14.0–48.0)
MCH: 32.7 pg (ref 26.0–34.0)
MCV: 94 fL (ref 81–101)
MONO#: 0.3 10*3/uL (ref 0.1–0.9)
MONO%: 7.1 % (ref 0.0–13.0)
NEUT%: 52.6 % (ref 39.6–80.0)
Platelets: 195 10*3/uL (ref 145–400)
RBC: 4.16 10*6/uL (ref 3.70–5.32)
WBC: 4.4 10*3/uL (ref 3.9–10.0)

## 2010-09-14 LAB — PROTIME-INR (CHCC SATELLITE)
INR: 3 (ref 2.0–3.5)
Protime: 36 s — ABNORMAL HIGH (ref 10.6–13.4)

## 2010-09-15 LAB — LACTATE DEHYDROGENASE: LDH: 160 U/L (ref 94–250)

## 2010-09-15 LAB — COMPREHENSIVE METABOLIC PANEL
AST: 30 U/L (ref 0–37)
Albumin: 4 g/dL (ref 3.5–5.2)
Alkaline Phosphatase: 68 U/L (ref 39–117)
BUN: 12 mg/dL (ref 6–23)
Potassium: 3.9 mEq/L (ref 3.5–5.3)
Sodium: 141 mEq/L (ref 135–145)
Total Bilirubin: 0.6 mg/dL (ref 0.3–1.2)
Total Protein: 6.4 g/dL (ref 6.0–8.3)

## 2010-09-15 LAB — VITAMIN D 25 HYDROXY (VIT D DEFICIENCY, FRACTURES): Vit D, 25-Hydroxy: 59 ng/mL (ref 30–89)

## 2010-10-04 LAB — TYPE AND SCREEN

## 2010-10-04 LAB — COMPREHENSIVE METABOLIC PANEL
ALT: 25
AST: 22
CO2: 28
Calcium: 9.1
GFR calc Af Amer: >60
GFR calc non Af Amer: >60
Potassium: 4.2
Sodium: 140

## 2010-10-04 LAB — CBC
Hemoglobin: 10.9 — ABNORMAL LOW
MCHC: 34.4
RBC: 4.16
RDW: 13.1
WBC: 5.3
WBC: 9.1

## 2010-10-04 LAB — BASIC METABOLIC PANEL
Calcium: 8.1 — ABNORMAL LOW
GFR calc Af Amer: >60
GFR calc non Af Amer: >60
Glucose, Bld: 135 — ABNORMAL HIGH
Sodium: 137

## 2010-10-05 LAB — URINALYSIS, ROUTINE W REFLEX MICROSCOPIC
Bilirubin Urine: NEGATIVE
Ketones, ur: NEGATIVE
Nitrite: NEGATIVE
Urobilinogen, UA: 1

## 2010-10-05 LAB — COMPREHENSIVE METABOLIC PANEL
ALT: 25
AST: 18
Alkaline Phosphatase: 78
BUN: 10
CO2: 22
CO2: 27
Calcium: 8.1 — ABNORMAL LOW
Calcium: 8.9
Creatinine, Ser: 0.53
GFR calc Af Amer: >60
GFR calc non Af Amer: >60
GFR calc non Af Amer: >60
Glucose, Bld: 102 — ABNORMAL HIGH
Potassium: 4.1
Sodium: 139

## 2010-10-05 LAB — APTT: aPTT: 56 — ABNORMAL HIGH

## 2010-10-05 LAB — DIFFERENTIAL
Basophils Relative: 2 — ABNORMAL HIGH
Eosinophils Absolute: 0.3
Neutrophils Relative %: 66

## 2010-10-05 LAB — CBC
HCT: 38.2
Hemoglobin: 12.8
MCHC: 33.3
MCHC: 33.6
MCHC: 33.9
MCV: 92
MCV: 92.8
Platelets: 126 — ABNORMAL LOW
Platelets: 257
RBC: 3.76 — ABNORMAL LOW
RBC: 4.12
RDW: 13
WBC: 4.7

## 2010-10-05 LAB — URINE CULTURE
Colony Count: >=100000
Special Requests: NEGATIVE

## 2010-10-05 LAB — PROTIME-INR
INR: 1
INR: 1
Prothrombin Time: 13.6
Prothrombin Time: 13.8

## 2010-10-05 LAB — HEPARIN LEVEL (UNFRACTIONATED)
Heparin Unfractionated: 0.17 — ABNORMAL LOW
Heparin Unfractionated: 0.62

## 2010-12-08 ENCOUNTER — Other Ambulatory Visit: Payer: Self-pay | Admitting: *Deleted

## 2010-12-08 DIAGNOSIS — I749 Embolism and thrombosis of unspecified artery: Secondary | ICD-10-CM

## 2010-12-08 MED ORDER — WARFARIN SODIUM 4 MG PO TABS: 4.0000 mg | ORAL_TABLET | Freq: Every day | ORAL | Status: DC

## 2010-12-08 NOTE — Telephone Encounter (Signed)
Pt called stating she is about out of her Warfarin and wants to know if Dr Myna Hidalgo still wants her to be on it or should stop as discussed previously and start ASA. Reviewed with Dr Myna Hidalgo. To continue Coumadin until the end of the year. He will discuss further during her visit on 12/6. She verbalized understanding.

## 2010-12-14 ENCOUNTER — Other Ambulatory Visit (HOSPITAL_BASED_OUTPATIENT_CLINIC_OR_DEPARTMENT_OTHER): Payer: Medicare Other | Admitting: Lab

## 2010-12-14 ENCOUNTER — Other Ambulatory Visit (HOSPITAL_BASED_OUTPATIENT_CLINIC_OR_DEPARTMENT_OTHER): Payer: Medicare Other

## 2010-12-14 ENCOUNTER — Ambulatory Visit (HOSPITAL_BASED_OUTPATIENT_CLINIC_OR_DEPARTMENT_OTHER)
Admission: RE | Admit: 2010-12-14 | Discharge: 2010-12-14 | Disposition: A | Discharge status: Home / Self Care | Payer: Medicare Other | Source: Ambulatory Visit | Attending: Hematology & Oncology | Admitting: Hematology & Oncology

## 2010-12-14 ENCOUNTER — Other Ambulatory Visit: Payer: Self-pay | Admitting: Hematology & Oncology

## 2010-12-14 ENCOUNTER — Ambulatory Visit (INDEPENDENT_AMBULATORY_CARE_PROVIDER_SITE_OTHER)
Admission: RE | Admit: 2010-12-14 | Discharge: 2010-12-14 | Disposition: A | Discharge status: Home / Self Care | Payer: Medicare Other | Source: Ambulatory Visit | Attending: Hematology & Oncology | Admitting: Hematology & Oncology

## 2010-12-14 DIAGNOSIS — C50419 Malignant neoplasm of upper-outer quadrant of unspecified female breast: Secondary | ICD-10-CM

## 2010-12-14 DIAGNOSIS — E049 Nontoxic goiter, unspecified: Secondary | ICD-10-CM | POA: Insufficient documentation

## 2010-12-14 DIAGNOSIS — C50919 Malignant neoplasm of unspecified site of unspecified female breast: Secondary | ICD-10-CM

## 2010-12-14 DIAGNOSIS — Z0389 Encounter for observation for other suspected diseases and conditions ruled out: Secondary | ICD-10-CM

## 2010-12-14 DIAGNOSIS — R928 Other abnormal and inconclusive findings on diagnostic imaging of breast: Secondary | ICD-10-CM

## 2010-12-14 DIAGNOSIS — Z7901 Long term (current) use of anticoagulants: Secondary | ICD-10-CM

## 2010-12-14 DIAGNOSIS — Z853 Personal history of malignant neoplasm of breast: Secondary | ICD-10-CM

## 2010-12-14 DIAGNOSIS — Z9010 Acquired absence of unspecified breast and nipple: Secondary | ICD-10-CM

## 2010-12-14 DIAGNOSIS — E559 Vitamin D deficiency, unspecified: Secondary | ICD-10-CM

## 2010-12-14 DIAGNOSIS — E041 Nontoxic single thyroid nodule: Secondary | ICD-10-CM

## 2010-12-14 DIAGNOSIS — I2699 Other pulmonary embolism without acute cor pulmonale: Secondary | ICD-10-CM

## 2010-12-14 LAB — CMP (CANCER CENTER ONLY)
AST: 30 U/L (ref 11–38)
Albumin: 3.4 g/dL (ref 3.3–5.5)
Alkaline Phosphatase: 70 U/L (ref 26–84)
BUN, Bld: 13 mg/dL (ref 7–22)
Potassium: 4 mEq/L (ref 3.3–4.7)
Total Bilirubin: 0.7 mg/dl (ref 0.20–1.60)

## 2010-12-14 LAB — CBC WITH DIFFERENTIAL (CANCER CENTER ONLY)
BASO%: 0.7 % (ref 0.0–2.0)
HCT: 43.1 % (ref 34.8–46.6)
LYMPH%: 35.9 % (ref 14.0–48.0)
MCH: 31.8 pg (ref 26.0–34.0)
MCHC: 34.1 g/dL (ref 32.0–36.0)
MCV: 93 fL (ref 81–101)
MONO%: 9.4 % (ref 0.0–13.0)
NEUT%: 49.1 % (ref 39.6–80.0)
Platelets: 214 10*3/uL (ref 145–400)
RDW: 12.4 % (ref 11.1–15.7)

## 2010-12-14 LAB — PROTIME-INR (CHCC SATELLITE): Protime: 24 Seconds — ABNORMAL HIGH (ref 10.6–13.4)

## 2010-12-14 MED ORDER — IOHEXOL 300 MG/ML  SOLN: 100.0000 mL | Freq: Once | INTRAMUSCULAR | Status: AC | PRN

## 2010-12-21 ENCOUNTER — Ambulatory Visit (HOSPITAL_BASED_OUTPATIENT_CLINIC_OR_DEPARTMENT_OTHER): Payer: Medicare Other | Admitting: Hematology & Oncology

## 2010-12-21 DIAGNOSIS — I82409 Acute embolism and thrombosis of unspecified deep veins of unspecified lower extremity: Secondary | ICD-10-CM

## 2010-12-21 DIAGNOSIS — C50919 Malignant neoplasm of unspecified site of unspecified female breast: Secondary | ICD-10-CM

## 2010-12-21 DIAGNOSIS — Z86718 Personal history of other venous thrombosis and embolism: Secondary | ICD-10-CM

## 2010-12-21 DIAGNOSIS — Z853 Personal history of malignant neoplasm of breast: Secondary | ICD-10-CM

## 2010-12-21 NOTE — Progress Notes (Signed)
CC:   Guy Sandifer. Henderson Cloud, M.D. Etter Sjogren, M.D.  DIAGNOSES: 1. 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 2. Pulmonary embolism.  CURRENT THERAPY:  The patient to stop Coumadin today and start aspirin 162 mg p.o. daily.  INTERIM HISTORY:  Ms. Parada comes in for followup.  She is doing quite well.  She and her family has had a good holiday season so far.  They had a nice Thanksgiving.  They are getting ready for Christmas.  She has been on Coumadin now for almost 3 years.  We will go ahead and get her off Coumadin now.  I believe that we can get her on low-dose aspirin per the [facility] Study which showed that aspirin after Coumadin decreased significantly the risk of recurrent VTE.  She did have a repeat a CT scan done.  This was done on 12/14/2010.  The repeat CT scan did not show any evidence of recurrent disease.  She is taking vitamin D.  She has been very diligent with staying active.  She watches what she eats very closely.  She denies any change in bowel or bladder habits.  No cough or shortness breath.  There has been no headache.  No double vision, blurred vision.  Overall, her perform status is ECOG zero.  PHYSICAL EXAM:  General: This is a well-developed, well-nourished white female in no obvious distress.  Vital signs: 97.4, pulse 80, respiratory rate 16, blood pressure 133/82, and weight is 164.  Head and neck exam shows a normocephalic, atraumatic skull.  There are no ocular or oral lesions.  She has no palpable cervical or supraclavicular lymph nodes. Lungs are clear bilaterally.  Cardiac examination:  Regular rate and rhythm with a normal S1 and S2.  There are no murmurs, rubs, or bruits. Breast exam: Shows left breast that is with an implant.  And the implant was intact.  No nodularity is noted about the left anterior chest wall. There is no left axillary adenopathy.  Right chest wall also has an implant.  This is also  intact.  No erythema, swelling, or nodularity is noted on the right anterior chest wall.  There is no right axillary adenopathy.  Abdominal exam: Soft with good bowel sounds.  There is no palpable abdominal mass.  There is no fluid wave.  There is no palpable hepatosplenomegaly.  Back exam: Shows no kyphosis or osteoporotic changes.  There is no tenderness over the spine, ribs, or hips. Extremities: Shows no clubbing, cyanosis, or edema.  Neurological exam: Shows no focal neurological deficits.  Skin exam: No rashes, ecchymosis, or petechia.  LABORATORY STUDIES:  Show a white cell count of 4.5, hemoglobin 14.7, hematocrit 43.1, and platelet count 214.  Sodium 142, potassium 4.0, BUN 13, and creatinine 0.6.  LFTs were normal.  Vitamin D is 64.  IMPRESSION:  Ms. Cavalieri is a 65 year old white female with history of recurrent adenocarcinoma of the right breast.  She did undergo bilateral mastectomies.  She completed chemotherapy back in December 2009.  Her tumor is ER NEGATIVE.  Ms. Sunderlin did tell me that Dr. Henderson Cloud wanted to put her on an estrogen cream because of menopausal symptoms.  She does have atrophy and dryness.  I do not see a problem with this.  Ms. Kucharski tumor was receptor negative.  I think even if the tumor was receptor positive, the amount of absorption is minimal and again this will really help Ms. Lindblad's quality of life.  Again, we can get her off the Coumadin now.  We will plan to start her on aspirin.  I do not see a need for any scans on her unless there is symptoms or changes in her lab work.  We will plan to get Ms. Golden back to see [place] in another 4 months.  I think we can probably get her back in April now.  I was glad to see Ms. Oquinn had a great year.  She clearly has stayed quite active.    ______________________________ Josph Macho, M.D. PRE/MEDQ  D:  12/21/2010  T:  12/21/2010  Job:  [id number]

## 2010-12-21 NOTE — Progress Notes (Signed)
Dict

## 2010-12-28 ENCOUNTER — Other Ambulatory Visit: Payer: Self-pay | Admitting: Hematology & Oncology

## 2010-12-28 DIAGNOSIS — C50919 Malignant neoplasm of unspecified site of unspecified female breast: Secondary | ICD-10-CM

## 2010-12-28 MED ORDER — INFLUENZA VIRUS VACC SPLIT PF IM SUSP
0.5000 mL | Freq: Once | INTRAMUSCULAR | Status: DC
  Filled 2010-12-28: qty 0.5

## 2011-02-03 ENCOUNTER — Other Ambulatory Visit: Payer: Self-pay | Admitting: Hematology & Oncology

## 2011-03-26 ENCOUNTER — Other Ambulatory Visit (HOSPITAL_BASED_OUTPATIENT_CLINIC_OR_DEPARTMENT_OTHER): Payer: Medicare Other | Admitting: Lab

## 2011-03-26 ENCOUNTER — Ambulatory Visit (HOSPITAL_BASED_OUTPATIENT_CLINIC_OR_DEPARTMENT_OTHER): Payer: 59 | Admitting: Hematology & Oncology

## 2011-03-26 VITALS — BP 131/81 | HR 76 | Temp 97.3°F | Ht 65.75 in | Wt 165.0 lb

## 2011-03-26 DIAGNOSIS — I2699 Other pulmonary embolism without acute cor pulmonale: Secondary | ICD-10-CM

## 2011-03-26 DIAGNOSIS — C50219 Malignant neoplasm of upper-inner quadrant of unspecified female breast: Secondary | ICD-10-CM | POA: Diagnosis not present

## 2011-03-26 DIAGNOSIS — Z171 Estrogen receptor negative status [ER-]: Secondary | ICD-10-CM | POA: Diagnosis not present

## 2011-03-26 DIAGNOSIS — C50919 Malignant neoplasm of unspecified site of unspecified female breast: Secondary | ICD-10-CM

## 2011-03-26 DIAGNOSIS — Z853 Personal history of malignant neoplasm of breast: Secondary | ICD-10-CM | POA: Diagnosis not present

## 2011-03-26 DIAGNOSIS — E559 Vitamin D deficiency, unspecified: Secondary | ICD-10-CM | POA: Diagnosis not present

## 2011-03-26 DIAGNOSIS — Z7901 Long term (current) use of anticoagulants: Secondary | ICD-10-CM | POA: Diagnosis not present

## 2011-03-26 LAB — CBC WITH DIFFERENTIAL (CANCER CENTER ONLY)
BASO%: 0.5 % (ref 0.0–2.0)
Eosinophils Absolute: 0.2 10*3/uL (ref 0.0–0.5)
LYMPH#: 1.5 10*3/uL (ref 0.9–3.3)
LYMPH%: 37.4 % (ref 14.0–48.0)
MCV: 93 fL (ref 81–101)
MONO#: 0.3 10*3/uL (ref 0.1–0.9)
NEUT#: 1.9 10*3/uL (ref 1.5–6.5)
Platelets: 196 10*3/uL (ref 145–400)
RBC: 4.4 10*6/uL (ref 3.70–5.32)
RDW: 12.3 % (ref 11.1–15.7)
WBC: 3.9 10*3/uL (ref 3.9–10.0)

## 2011-03-26 NOTE — Progress Notes (Signed)
This office note has been dictated.

## 2011-03-26 NOTE — Progress Notes (Signed)
CC:   Margaret English. Margaret English, M.D. Margaret English, M.D.  DIAGNOSES: 1. 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 2. Pulmonary embolism.  CURRENT THERAPY:  Aspirin 162 mg daily.  INTERVAL HISTORY:  Margaret English comes in for followup.  She is doing great.  We last saw her back in December.  Since then, she has had no problems at all.  She and her husband are getting ready go on a trans[place]antic cruise in April.  They are looking forward to this.  The patient is on aspirin for the history of the pulmonary embolism.  She was on Coumadin for close to 3 years.  I think aspirin is doing well for her.  She has had no problems with abdominal pain.  There has been no change in bowel or bladder habits.  She has had no headache.  There has been no nausea or vomiting.  There has been no skin rash.  There has been no leg swelling.  PHYSICAL EXAMINATION:  General Appearance:  This is a well-developed, well-nourished, white female in no obvious distress.  Vital Signs: Temperature 97.3.  Pulse 76.  Respiratory rate 18.  Blood pressure 131/81.  Weight is 165.  Head and Neck Exam:  A normocephalic, atraumatic skull.  There are no ocular or oral lesions.  There are no palpable cervical or supraclavicular lymph nodes.  Lungs:  Clear bilaterally.  Cardiac Exam:  Regular rate and rhythm with a normal S1 and S2.  There are no murmurs, rubs, or bruits.  Breast Exam:  Bilateral mastectomies.  She has implants.  There are no abnormalities with the implant sites.  There is no erythema or nodularity.  There is no bilateral axillary adenopathy.  Extremities:  No clubbing, cyanosis, or edema.  Back Exam:  No tenderness over the spine, rubs, or hips.  LABORATORY STUDIES:  White cell count is 3.9, hemoglobin 14, hematocrit 41, platelet count 196.  IMPRESSION:  Margaret English is a 66 year old, white female with a past history of recurrent ductal carcinoma of the right breast.   She underwent fairly intensive chemotherapy.  Her tumor was ER negative. She completed her chemotherapy back in December 2009.  She did develop a pulmonary embolism during all of this.  She was on anticoagulation.  She is now on aspirin and doing well with aspirin.  I do not see a need for any scans right now.  Her lab work has been looking quite well.  We will go ahead and plan to get her back to see me now in another 3 months.    ______________________________ Josph Macho, M.D. PRE/MEDQ  D:  03/26/2011  T:  03/26/2011  Job:  [id number]

## 2011-03-27 LAB — COMPREHENSIVE METABOLIC PANEL
Albumin: 4.1 g/dL (ref 3.5–5.2)
BUN: 11 mg/dL (ref 6–23)
CO2: 27 mEq/L (ref 19–32)
Glucose, Bld: 126 mg/dL — ABNORMAL HIGH (ref 70–99)
Sodium: 143 mEq/L (ref 135–145)
Total Bilirubin: 0.6 mg/dL (ref 0.3–1.2)
Total Protein: 6.2 g/dL (ref 6.0–8.3)

## 2011-03-27 LAB — VITAMIN D 25 HYDROXY (VIT D DEFICIENCY, FRACTURES): Vit D, 25-Hydroxy: 60 ng/mL (ref 30–89)

## 2011-03-27 LAB — D-DIMER, QUANTITATIVE: D-Dimer, Quant: 0.23 ug/mL-FEU (ref 0.00–0.48)

## 2011-03-27 LAB — LACTATE DEHYDROGENASE: LDH: 119 U/L (ref 94–250)

## 2011-03-30 ENCOUNTER — Telehealth: Payer: Self-pay | Admitting: *Deleted

## 2011-03-30 NOTE — Telephone Encounter (Signed)
Left Dr. Gustavo Lah message on pt's home phone answering machine.

## 2011-03-30 NOTE — Telephone Encounter (Signed)
Message copied by Mirian Capuchin on Fri Mar 30, 2011  4:53 PM ------      Message from: Josph Macho      Created: Tue Mar 27, 2011  6:36 PM       Call - labs and vit D are great!!!  pete

## 2011-04-10 ENCOUNTER — Encounter: Payer: Self-pay | Admitting: *Deleted

## 2011-04-17 DIAGNOSIS — M999 Biomechanical lesion, unspecified: Secondary | ICD-10-CM | POA: Diagnosis not present

## 2011-04-17 DIAGNOSIS — M5412 Radiculopathy, cervical region: Secondary | ICD-10-CM | POA: Diagnosis not present

## 2011-04-17 DIAGNOSIS — IMO0001 Reserved for inherently not codable concepts without codable children: Secondary | ICD-10-CM | POA: Diagnosis not present

## 2011-04-17 DIAGNOSIS — IMO0002 Reserved for concepts with insufficient information to code with codable children: Secondary | ICD-10-CM | POA: Diagnosis not present

## 2011-06-11 ENCOUNTER — Other Ambulatory Visit: Payer: Medicare Other | Admitting: Lab

## 2011-06-11 ENCOUNTER — Ambulatory Visit: Payer: Medicare Other | Admitting: Hematology & Oncology

## 2011-06-19 DIAGNOSIS — M999 Biomechanical lesion, unspecified: Secondary | ICD-10-CM | POA: Diagnosis not present

## 2011-06-19 DIAGNOSIS — M5412 Radiculopathy, cervical region: Secondary | ICD-10-CM | POA: Diagnosis not present

## 2011-06-19 DIAGNOSIS — IMO0001 Reserved for inherently not codable concepts without codable children: Secondary | ICD-10-CM | POA: Diagnosis not present

## 2011-06-19 DIAGNOSIS — IMO0002 Reserved for concepts with insufficient information to code with codable children: Secondary | ICD-10-CM | POA: Diagnosis not present

## 2011-06-21 ENCOUNTER — Ambulatory Visit (HOSPITAL_BASED_OUTPATIENT_CLINIC_OR_DEPARTMENT_OTHER): Payer: 59 | Admitting: Hematology & Oncology

## 2011-06-21 ENCOUNTER — Other Ambulatory Visit (HOSPITAL_BASED_OUTPATIENT_CLINIC_OR_DEPARTMENT_OTHER): Payer: 59 | Admitting: Lab

## 2011-06-21 VITALS — BP 119/77 | HR 77 | Temp 97.5°F | Ht 65.0 in | Wt 166.0 lb

## 2011-06-21 DIAGNOSIS — E559 Vitamin D deficiency, unspecified: Secondary | ICD-10-CM | POA: Diagnosis not present

## 2011-06-21 DIAGNOSIS — C50219 Malignant neoplasm of upper-inner quadrant of unspecified female breast: Secondary | ICD-10-CM | POA: Diagnosis not present

## 2011-06-21 DIAGNOSIS — I82409 Acute embolism and thrombosis of unspecified deep veins of unspecified lower extremity: Secondary | ICD-10-CM

## 2011-06-21 DIAGNOSIS — I2699 Other pulmonary embolism without acute cor pulmonale: Secondary | ICD-10-CM

## 2011-06-21 DIAGNOSIS — Z7982 Long term (current) use of aspirin: Secondary | ICD-10-CM | POA: Diagnosis not present

## 2011-06-21 DIAGNOSIS — C50919 Malignant neoplasm of unspecified site of unspecified female breast: Secondary | ICD-10-CM | POA: Diagnosis not present

## 2011-06-21 DIAGNOSIS — Z86711 Personal history of pulmonary embolism: Secondary | ICD-10-CM

## 2011-06-21 LAB — CBC WITH DIFFERENTIAL (CANCER CENTER ONLY)
BASO#: 0 10*3/uL (ref 0.0–0.2)
EOS%: 6.4 % (ref 0.0–7.0)
Eosinophils Absolute: 0.3 10*3/uL (ref 0.0–0.5)
HCT: 39.8 % (ref 34.8–46.6)
HGB: 13.7 g/dL (ref 11.6–15.9)
LYMPH%: 33.2 % (ref 14.0–48.0)
MCH: 31.9 pg (ref 26.0–34.0)
MCHC: 34.4 g/dL (ref 32.0–36.0)
MCV: 93 fL (ref 81–101)
MONO%: 8.1 % (ref 0.0–13.0)
NEUT#: 2.2 10*3/uL (ref 1.5–6.5)
NEUT%: 51.8 % (ref 39.6–80.0)
RBC: 4.29 10*6/uL (ref 3.70–5.32)

## 2011-06-21 NOTE — Progress Notes (Signed)
This office note has been dictated.

## 2011-06-22 LAB — COMPREHENSIVE METABOLIC PANEL
ALT: 21 U/L (ref 0–35)
AST: 20 U/L (ref 0–37)
Alkaline Phosphatase: 68 U/L (ref 39–117)
BUN: 12 mg/dL (ref 6–23)
Chloride: 108 mEq/L (ref 96–112)
Creatinine, Ser: 0.72 mg/dL (ref 0.50–1.10)
Total Bilirubin: 0.5 mg/dL (ref 0.3–1.2)

## 2011-06-22 LAB — VITAMIN D 25 HYDROXY (VIT D DEFICIENCY, FRACTURES): Vit D, 25-Hydroxy: 60 ng/mL (ref 30–89)

## 2011-06-22 NOTE — Progress Notes (Signed)
CC:   Margaret English. Margaret English, M.D. Etter Sjogren, M.D.  DIAGNOSES: 1. 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 2. History of pulmonary embolism.  CURRENT THERAPY:  Aspirin 162 mg p.o. daily.  INTERIM HISTORY:  Margaret English comes in for followup.  We see her every 3 months.  We will probably make it every 4 month followup after this visit.  As always, she and her husband were on a cruise.  They were over in [place].  They went on a trans[place]antic cruise.  They had a good time. She plans on doing this again in the fall.  She has had no problems with pain.  She says she has more sensation now where she had her mastectomy reconstruction.  This is somewhat pleasing to her.  She has had no arm or leg swelling.  She has had no fever, sweats or chills.  There has been no bleeding.  There has been no abdominal pain. Her appetite has been quite good.  PHYSICAL EXAMINATION:  This is a well-developed, well-nourished white female in no obvious distress.  Vital signs:  97.5, pulse 77, respiratory rate 18, blood pressure 119/77.  Weight is 166.  Head and neck:  Normocephalic, atraumatic skull.  There are no ocular or oral lesions.  There are no palpable cervical or supraclavicular lymph nodes. Lungs:  Clear to percussion and auscultation bilaterally.  Cardiac: Regular rate and rhythm with a normal S1 and S2.  There are no murmurs, rubs or bruits.  Chest wall shows bilateral mastectomies.  She has implants.  The implants are well positioned.  There is no axillary adenopathy bilaterally.  Abdomen:  Soft with good bowel sounds.  There is no palpable abdominal mass.  There is no fluid wave.  There is no guarding or rebound tenderness.  There is no palpable hepatosplenomegaly.  Back: No tenderness over the spine, ribs, or hips. Extremities: No clubbing, cyanosis or edema.  She has good range of motion of her joints.  She has good pulses in her distal  extremities.  LABORATORY STUDIES:  White cell count is 4.2, hemoglobin 13.7, hematocrit 39.8, platelet count 191.  IMPRESSION:  Margaret English is a 66 year old white female with a history of recurrent ductal carcinoma of the right breast.  She now is 4-1/2 years out from completion of her chemotherapy.  She, today, has no evidence of recurrence.  She also had a pulmonary embolism back with respect to her breast cancer.  She was treated with 3 years of anticoagulation.  I have her on aspirin now.  She is doing well with the aspirin with no evidence of hypercoagulability.  Of note, her last D-dimer that we did on her back in March was 0.23.  Again, I think we can probably go every 4 months now for Margaret English. This certainly will make her happy.    ______________________________ Josph Macho, M.D. PRE/MEDQ  D:  06/21/2011  T:  06/22/2011  Job:  [id number]

## 2011-07-16 DIAGNOSIS — M999 Biomechanical lesion, unspecified: Secondary | ICD-10-CM | POA: Diagnosis not present

## 2011-07-16 DIAGNOSIS — IMO0002 Reserved for concepts with insufficient information to code with codable children: Secondary | ICD-10-CM | POA: Diagnosis not present

## 2011-07-16 DIAGNOSIS — M5412 Radiculopathy, cervical region: Secondary | ICD-10-CM | POA: Diagnosis not present

## 2011-07-16 DIAGNOSIS — IMO0001 Reserved for inherently not codable concepts without codable children: Secondary | ICD-10-CM | POA: Diagnosis not present

## 2011-09-07 DIAGNOSIS — D1801 Hemangioma of skin and subcutaneous tissue: Secondary | ICD-10-CM | POA: Diagnosis not present

## 2011-09-07 DIAGNOSIS — L821 Other seborrheic keratosis: Secondary | ICD-10-CM | POA: Diagnosis not present

## 2011-09-07 DIAGNOSIS — L819 Disorder of pigmentation, unspecified: Secondary | ICD-10-CM | POA: Diagnosis not present

## 2011-09-07 DIAGNOSIS — D239 Other benign neoplasm of skin, unspecified: Secondary | ICD-10-CM | POA: Diagnosis not present

## 2011-10-02 DIAGNOSIS — R109 Unspecified abdominal pain: Secondary | ICD-10-CM | POA: Diagnosis not present

## 2011-10-02 DIAGNOSIS — K921 Melena: Secondary | ICD-10-CM | POA: Diagnosis not present

## 2011-10-15 DIAGNOSIS — H2510 Age-related nuclear cataract, unspecified eye: Secondary | ICD-10-CM | POA: Diagnosis not present

## 2011-10-19 ENCOUNTER — Other Ambulatory Visit (HOSPITAL_BASED_OUTPATIENT_CLINIC_OR_DEPARTMENT_OTHER): Payer: Medicare Other | Admitting: Lab

## 2011-10-19 ENCOUNTER — Ambulatory Visit (HOSPITAL_BASED_OUTPATIENT_CLINIC_OR_DEPARTMENT_OTHER): Payer: Medicare Other | Admitting: Hematology & Oncology

## 2011-10-19 VITALS — BP 121/71 | HR 76 | Temp 97.8°F | Resp 16 | Ht 65.0 in | Wt 165.0 lb

## 2011-10-19 DIAGNOSIS — C50919 Malignant neoplasm of unspecified site of unspecified female breast: Secondary | ICD-10-CM

## 2011-10-19 DIAGNOSIS — C50419 Malignant neoplasm of upper-outer quadrant of unspecified female breast: Secondary | ICD-10-CM | POA: Diagnosis not present

## 2011-10-19 DIAGNOSIS — Z86711 Personal history of pulmonary embolism: Secondary | ICD-10-CM

## 2011-10-19 DIAGNOSIS — J329 Chronic sinusitis, unspecified: Secondary | ICD-10-CM

## 2011-10-19 DIAGNOSIS — E559 Vitamin D deficiency, unspecified: Secondary | ICD-10-CM

## 2011-10-19 DIAGNOSIS — M810 Age-related osteoporosis without current pathological fracture: Secondary | ICD-10-CM

## 2011-10-19 LAB — CBC WITH DIFFERENTIAL (CANCER CENTER ONLY)
BASO#: 0 10*3/uL (ref 0.0–0.2)
BASO%: 0.7 % (ref 0.0–2.0)
EOS%: 5.2 % (ref 0.0–7.0)
HCT: 40.5 % (ref 34.8–46.6)
HGB: 13.9 g/dL (ref 11.6–15.9)
LYMPH#: 1.5 10*3/uL (ref 0.9–3.3)
LYMPH%: 37.8 % (ref 14.0–48.0)
MCH: 31.7 pg (ref 26.0–34.0)
MCHC: 34.3 g/dL (ref 32.0–36.0)
MCV: 93 fL (ref 81–101)
MONO%: 10.4 % (ref 0.0–13.0)
NEUT%: 45.9 % (ref 39.6–80.0)
RDW: 12.3 % (ref 11.1–15.7)

## 2011-10-19 MED ORDER — AZITHROMYCIN 250 MG PO TABS: ORAL_TABLET | ORAL | Status: DC

## 2011-10-19 NOTE — Progress Notes (Signed)
CC:   Guy Sandifer. Henderson Cloud, M.D.  DIAGNOSES: 1. Recurrent ductal carcinoma of the right breast, clinical remission. 2. History of pulmonary embolism.  CURRENT THERAPY:  Aspirin 162 mg p.o. daily.  INTERVAL HISTORY:  Ms. Cryan comes in for followup.  She is doing quite well.  We saw her back in June.  She was getting ready to go over to [place] and then cruise back on a luxury liner across the [place] to [place].  Her husband and she are going down to [place] this weekend, and then she will be flying over to [place] with a friend.  She feels well.  She is really having issues with having to do mammograms.  She has no problems doing ultrasounds.  [facility] just [facility] for her.  She has bilateral breast reconstructions.  She has had no problems with bowels or bladder.  There has been no leg swelling.  She has had no rashes.  She has had no cough or shortness of breath.  There has been no headache.  She was, I think, seen for diverticulitis.  She has not had a colonoscopy in 10 years.  We will have to see about making a referral for her.  PHYSICAL EXAMINATION:  General:  This is a well-developed, well- nourished white female, in no obvious distress.  Vital Signs: Temperature 97.8, pulse 76, respiratory rate 16, blood pressure 121/71. Weight is 165.  Head and Neck:  Normocephalic, atraumatic skull.  There are no ocular or oral lesions.  There are no palpable cervical or supraclavicular lymph nodes.  Lungs:  Clear bilaterally.  Cardiac: Regular rate and rhythm, with a normal S1 and S2.  There are no murmurs, rubs, or bruits.  Abdomen:  Soft, with good bowel sounds.  There is no palpable abdominal mass.  There is no fluid wave.  There is no palpable hepatosplenomegaly.  Breasts:  Show bilateral mastectomies.  She has implants.  No adenopathy is noted bilaterally in the axilla.  No nodularity or erythema is noted about the mastectomy or about the implants.   Back:  No kyphosis or osteoporotic changes.  Extremities: Shows no clubbing, cyanosis or edema.  Skin:  No rashes, ecchymosis, or petechia.  Neurological:  No focal neurological deficits.  LABORATORY STUDIES:  White cell count is 4.1, hemoglobin 14, hematocrit 40, platelet count 249,000.  Sodium 141, potassium 4.4, BUN 11, creatinine 0.7.  Liver function tests are normal.  IMPRESSION:  Ms. Feria is a 66 year old white female with a history of recurrent ductal carcinoma of the right breast.  She underwent aggressive chemotherapy.  She is now about 5 years out from completion of her chemotherapy.  She also had a pulmonary embolism.  This was treated with anticoagulation.  She had, I think, 3 years of anticoagulation.  She is on aspirin and doing well.  We will have to see about calling Dr. Matthias Hughs or his partners for the colonoscopy.  I will see if the [facility] can just do an ultrasound.  We will have Ms. Hopson come back to see [place] in another 4 months.  As always, she will have a great time on her cruise.    ______________________________ Josph Macho, M.D. PRE/MEDQ  D:  10/19/2011  T:  10/19/2011  Job:  [id number]

## 2011-10-19 NOTE — Patient Instructions (Signed)
Call with problems.

## 2011-10-19 NOTE — Progress Notes (Signed)
This office note has been dictated.

## 2011-10-20 LAB — COMPREHENSIVE METABOLIC PANEL
ALT: 23 U/L (ref 0–35)
AST: 20 U/L (ref 0–37)
BUN: 11 mg/dL (ref 6–23)
Calcium: 9.6 mg/dL (ref 8.4–10.5)
Chloride: 104 mEq/L (ref 96–112)
Creatinine, Ser: 0.71 mg/dL (ref 0.50–1.10)
Total Bilirubin: 0.9 mg/dL (ref 0.3–1.2)

## 2011-10-23 ENCOUNTER — Telehealth: Payer: Self-pay | Admitting: *Deleted

## 2011-10-23 NOTE — Telephone Encounter (Addendum)
Message copied by Mirian Capuchin on Tue Oct 23, 2011  9:54 AM ------      Message from: Josph Macho      Created: Mon Oct 22, 2011  5:48 PM       Call - labs are ok!!!  Cindee Lame This message left on pt's home answering machine.

## 2012-02-22 ENCOUNTER — Other Ambulatory Visit: Payer: Medicare Other | Admitting: Lab

## 2012-02-22 ENCOUNTER — Ambulatory Visit: Payer: Medicare Other | Admitting: Hematology & Oncology

## 2012-02-27 ENCOUNTER — Other Ambulatory Visit: Payer: Medicare Other | Admitting: Lab

## 2012-02-27 ENCOUNTER — Ambulatory Visit (HOSPITAL_BASED_OUTPATIENT_CLINIC_OR_DEPARTMENT_OTHER): Payer: Medicare Other | Admitting: Hematology & Oncology

## 2012-02-27 VITALS — BP 115/73 | HR 79 | Temp 98.1°F | Resp 16 | Ht 65.0 in | Wt 166.0 lb

## 2012-02-27 DIAGNOSIS — Z86711 Personal history of pulmonary embolism: Secondary | ICD-10-CM | POA: Diagnosis not present

## 2012-02-27 DIAGNOSIS — Z853 Personal history of malignant neoplasm of breast: Secondary | ICD-10-CM

## 2012-02-27 DIAGNOSIS — M810 Age-related osteoporosis without current pathological fracture: Secondary | ICD-10-CM

## 2012-02-27 DIAGNOSIS — C50912 Malignant neoplasm of unspecified site of left female breast: Secondary | ICD-10-CM

## 2012-02-27 DIAGNOSIS — Z9010 Acquired absence of unspecified breast and nipple: Secondary | ICD-10-CM

## 2012-02-27 DIAGNOSIS — C50919 Malignant neoplasm of unspecified site of unspecified female breast: Secondary | ICD-10-CM

## 2012-02-27 DIAGNOSIS — E559 Vitamin D deficiency, unspecified: Secondary | ICD-10-CM | POA: Diagnosis not present

## 2012-02-27 LAB — CBC WITH DIFFERENTIAL (CANCER CENTER ONLY)
Eosinophils Absolute: 0.2 10*3/uL (ref 0.0–0.5)
HCT: 41.2 % (ref 34.8–46.6)
LYMPH%: 34.3 % (ref 14.0–48.0)
MCH: 31.2 pg (ref 26.0–34.0)
MCV: 93 fL (ref 81–101)
MONO#: 0.5 10*3/uL (ref 0.1–0.9)
NEUT%: 53.4 % (ref 39.6–80.0)
RBC: 4.45 10*6/uL (ref 3.70–5.32)
WBC: 6.3 10*3/uL (ref 3.9–10.0)

## 2012-02-27 NOTE — Progress Notes (Signed)
This office note has been dictated.

## 2012-02-28 ENCOUNTER — Telehealth: Payer: Self-pay | Admitting: Hematology & Oncology

## 2012-02-28 ENCOUNTER — Encounter: Payer: Self-pay | Admitting: *Deleted

## 2012-02-28 LAB — COMPREHENSIVE METABOLIC PANEL
BUN: 18 mg/dL (ref 6–23)
CO2: 28 mEq/L (ref 19–32)
Creatinine, Ser: 0.73 mg/dL (ref 0.50–1.10)
Glucose, Bld: 97 mg/dL (ref 70–99)
Total Bilirubin: 0.5 mg/dL (ref 0.3–1.2)
Total Protein: 6.3 g/dL (ref 6.0–8.3)

## 2012-02-28 LAB — VITAMIN D 25 HYDROXY (VIT D DEFICIENCY, FRACTURES): Vit D, 25-Hydroxy: 54 ng/mL (ref 30–89)

## 2012-02-28 NOTE — Telephone Encounter (Signed)
Mailed June schedule °
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
## 2012-03-04 ENCOUNTER — Telehealth: Payer: Self-pay | Admitting: Hematology & Oncology

## 2012-03-04 NOTE — Telephone Encounter (Addendum)
Message copied by Cathi Roan on Tue Mar 04, 2012 11:50 AM ------      Message from: Arlan Organ R      Created: Wed Feb 27, 2012  8:43 PM       Call - labs look ok!!  Cindee Lame  03-04-12.  Called Pt. And left message on her home phone regarding above MD message. Advised if any questions to call this office.  Lupita Raider LPN ------

## 2012-04-30 ENCOUNTER — Other Ambulatory Visit: Payer: Self-pay

## 2012-04-30 DIAGNOSIS — Z1231 Encounter for screening mammogram for malignant neoplasm of breast: Secondary | ICD-10-CM

## 2012-04-30 DIAGNOSIS — Z9013 Acquired absence of bilateral breasts and nipples: Secondary | ICD-10-CM

## 2012-05-06 ENCOUNTER — Ambulatory Visit
Admission: RE | Admit: 2012-05-06 | Discharge: 2012-05-06 | Disposition: A | Discharge status: Home / Self Care | Payer: Medicare Other | Source: Ambulatory Visit

## 2012-05-06 DIAGNOSIS — Z1231 Encounter for screening mammogram for malignant neoplasm of breast: Secondary | ICD-10-CM

## 2012-05-06 DIAGNOSIS — Z9013 Acquired absence of bilateral breasts and nipples: Secondary | ICD-10-CM

## 2012-06-04 DIAGNOSIS — Z01419 Encounter for gynecological examination (general) (routine) without abnormal findings: Secondary | ICD-10-CM | POA: Diagnosis not present

## 2012-06-26 ENCOUNTER — Telehealth: Payer: Self-pay | Admitting: Hematology & Oncology

## 2012-06-26 ENCOUNTER — Other Ambulatory Visit (HOSPITAL_BASED_OUTPATIENT_CLINIC_OR_DEPARTMENT_OTHER): Payer: Medicare Other | Admitting: Lab

## 2012-06-26 ENCOUNTER — Ambulatory Visit (HOSPITAL_BASED_OUTPATIENT_CLINIC_OR_DEPARTMENT_OTHER): Payer: Medicare Other | Admitting: Hematology & Oncology

## 2012-06-26 VITALS — BP 131/69 | HR 69 | Temp 98.2°F | Resp 16 | Ht 65.0 in | Wt 164.0 lb

## 2012-06-26 DIAGNOSIS — C50912 Malignant neoplasm of unspecified site of left female breast: Secondary | ICD-10-CM

## 2012-06-26 DIAGNOSIS — M810 Age-related osteoporosis without current pathological fracture: Secondary | ICD-10-CM | POA: Diagnosis not present

## 2012-06-26 DIAGNOSIS — I2699 Other pulmonary embolism without acute cor pulmonale: Secondary | ICD-10-CM

## 2012-06-26 DIAGNOSIS — C50919 Malignant neoplasm of unspecified site of unspecified female breast: Secondary | ICD-10-CM | POA: Diagnosis not present

## 2012-06-26 LAB — CBC WITH DIFFERENTIAL (CANCER CENTER ONLY)
Eosinophils Absolute: 0.3 10*3/uL (ref 0.0–0.5)
HCT: 41.9 % (ref 34.8–46.6)
HGB: 14.1 g/dL (ref 11.6–15.9)
LYMPH#: 2.4 10*3/uL (ref 0.9–3.3)
LYMPH%: 33.4 % (ref 14.0–48.0)
MCV: 94 fL (ref 81–101)
MONO#: 0.6 10*3/uL (ref 0.1–0.9)
NEUT%: 53.7 % (ref 39.6–80.0)
RBC: 4.46 10*6/uL (ref 3.70–5.32)
WBC: 7.2 10*3/uL (ref 3.9–10.0)

## 2012-06-26 LAB — COMPREHENSIVE METABOLIC PANEL
CO2: 29 mEq/L (ref 19–32)
Calcium: 9.8 mg/dL (ref 8.4–10.5)
Chloride: 103 mEq/L (ref 96–112)
Creatinine, Ser: 0.66 mg/dL (ref 0.50–1.10)
Glucose, Bld: 88 mg/dL (ref 70–99)
Total Bilirubin: 0.6 mg/dL (ref 0.3–1.2)
Total Protein: 6.4 g/dL (ref 6.0–8.3)

## 2012-06-26 NOTE — Telephone Encounter (Signed)
In basket order was to schedule October appointment, pt moved it to november

## 2012-06-26 NOTE — Progress Notes (Signed)
This office note has been dictated.

## 2012-06-27 NOTE — Progress Notes (Signed)
CC:   Guy Sandifer. Henderson Cloud, M.D.  DIAGNOSES: 1. 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 2. History of pulmonary emboli.  CURRENT THERAPY: 1. Aspirin 162 mg p.o. daily. 2. Vitamin D daily.  INTERIM HISTORY:  [name] comes in for follow up.  We see her every 4 months or so.  She is doing well.  She has had no complaints since we last saw her.  As always, she is traveling.  She will be going to [place] in August to the [employer].  She has had no fatigue or weakness.  She has had no bony pain.  She has had no nausea.  It appears there has been no cough or shortness of breath.  She has had no change in bowel or bladder habits.  There have been no headaches.  PHYSICAL EXAMINATION:  General:  This is a well-developed, well- nourished, white female in no obvious distress.  Vital signs: Temperature of 98.2, pulse 69, respiratory rate 16, blood pressure 131/69.  Weight is 164.  Head and neck examinations shows normocephalic, atraumatic skull.  There are no ocular or oral lesions.  There are no palpable cervical or supraclavicular lymph nodes.  Lungs:  Clear to percussion and auscultation bilaterally.  Cardiac:  Regular rate and rhythm with a normal S1 and S2.  There are no murmurs, rubs or bruits. Abdomen:  Soft with good bowel sounds.  There is no palpable abdominal mass.  There is no fluid wave.  There is no palpable hepatosplenomegaly. Breasts:  Shows bilateral mastectomies.  She has implants.  There is no nodularity, erythema or tenderness about the implants.  There is no bilateral axillary adenopathy.  Back:  No tenderness over the spine, ribs, or hips.  Extremities:  Show no clubbing, cyanosis or edema.  She has good range motion of her joints.  Skin:  No rashes, ecchymoses, or petechia.  LABORATORY STUDIES:  White cell count is 7.2, hemoglobin 14.1, hematocrit 41.9, platelet count 215.  IMPRESSION:  [name] is a very charming  67 year old white female with history of recurrent infiltrating ductal carcinoma of the left breast. Again, she underwent mastectomy.  She had bilateral mastectomies.  She had adjuvant chemotherapy.  It appeared her tumor was ER negative. She completed chemotherapy back in 2009.  She developed pulmonary embolism.  She was on Coumadin for 3 years.  She tolerated this very well.  Again, I do not see any evidence of recurrence of her cancer.  Again, there is a risk that we have to watch out for.  We will plan to get her back in 4 more months.  I do not see any need for any additional studies.    ______________________________ Josph Macho, M.D. PRE/MEDQ  D:  06/26/2012  T:  06/27/2012  Job:  [id number]

## 2012-06-30 ENCOUNTER — Telehealth: Payer: Self-pay | Admitting: *Deleted

## 2012-06-30 NOTE — Telephone Encounter (Addendum)
Message copied by Mirian Capuchin on Mon Jun 30, 2012 12:41 PM ------      Message from: Josph Macho      Created: Fri Jun 27, 2012  9:01 AM       Call - labs look great.  Margaret English ------This message given to pt.  Voiced understanding.

## 2012-07-22 DIAGNOSIS — R109 Unspecified abdominal pain: Secondary | ICD-10-CM | POA: Diagnosis not present

## 2012-08-11 ENCOUNTER — Ambulatory Visit: Payer: Medicare Other | Admitting: Family Medicine

## 2012-10-10 DIAGNOSIS — Z0000 Encounter for general adult medical examination without abnormal findings: Secondary | ICD-10-CM | POA: Diagnosis not present

## 2012-10-10 DIAGNOSIS — Z853 Personal history of malignant neoplasm of breast: Secondary | ICD-10-CM | POA: Diagnosis not present

## 2012-10-10 DIAGNOSIS — Z1211 Encounter for screening for malignant neoplasm of colon: Secondary | ICD-10-CM | POA: Diagnosis not present

## 2012-10-10 DIAGNOSIS — Z136 Encounter for screening for cardiovascular disorders: Secondary | ICD-10-CM | POA: Diagnosis not present

## 2012-10-10 DIAGNOSIS — M899 Disorder of bone, unspecified: Secondary | ICD-10-CM | POA: Diagnosis not present

## 2012-10-21 DIAGNOSIS — H2510 Age-related nuclear cataract, unspecified eye: Secondary | ICD-10-CM | POA: Diagnosis not present

## 2012-11-13 ENCOUNTER — Ambulatory Visit (HOSPITAL_BASED_OUTPATIENT_CLINIC_OR_DEPARTMENT_OTHER): Payer: Medicare Other | Admitting: Hematology & Oncology

## 2012-11-13 ENCOUNTER — Other Ambulatory Visit (HOSPITAL_BASED_OUTPATIENT_CLINIC_OR_DEPARTMENT_OTHER): Payer: Medicare Other | Admitting: Lab

## 2012-11-13 VITALS — BP 128/67 | HR 76 | Temp 98.0°F | Resp 14 | Ht 65.0 in | Wt 164.0 lb

## 2012-11-13 DIAGNOSIS — C50919 Malignant neoplasm of unspecified site of unspecified female breast: Secondary | ICD-10-CM

## 2012-11-13 DIAGNOSIS — C50912 Malignant neoplasm of unspecified site of left female breast: Secondary | ICD-10-CM

## 2012-11-13 DIAGNOSIS — I2699 Other pulmonary embolism without acute cor pulmonale: Secondary | ICD-10-CM

## 2012-11-13 DIAGNOSIS — D0592 Unspecified type of carcinoma in situ of left breast: Secondary | ICD-10-CM

## 2012-11-13 DIAGNOSIS — Z86711 Personal history of pulmonary embolism: Secondary | ICD-10-CM | POA: Diagnosis not present

## 2012-11-13 DIAGNOSIS — M810 Age-related osteoporosis without current pathological fracture: Secondary | ICD-10-CM | POA: Diagnosis not present

## 2012-11-13 DIAGNOSIS — G4701 Insomnia due to medical condition: Secondary | ICD-10-CM

## 2012-11-13 DIAGNOSIS — E559 Vitamin D deficiency, unspecified: Secondary | ICD-10-CM | POA: Diagnosis not present

## 2012-11-13 LAB — COMPREHENSIVE METABOLIC PANEL
ALT: 27 U/L (ref 0–35)
Albumin: 4.1 g/dL (ref 3.5–5.2)
CO2: 29 mEq/L (ref 19–32)
Calcium: 9.9 mg/dL (ref 8.4–10.5)
Chloride: 105 mEq/L (ref 96–112)
Glucose, Bld: 86 mg/dL (ref 70–99)
Sodium: 143 mEq/L (ref 135–145)
Total Bilirubin: 0.6 mg/dL (ref 0.3–1.2)
Total Protein: 6.3 g/dL (ref 6.0–8.3)

## 2012-11-13 LAB — CBC WITH DIFFERENTIAL (CANCER CENTER ONLY)
BASO%: 0.6 % (ref 0.0–2.0)
Eosinophils Absolute: 0.3 10*3/uL (ref 0.0–0.5)
HCT: 41.8 % (ref 34.8–46.6)
LYMPH#: 1.9 10*3/uL (ref 0.9–3.3)
LYMPH%: 27.8 % (ref 14.0–48.0)
MCV: 93 fL (ref 81–101)
MONO#: 0.6 10*3/uL (ref 0.1–0.9)
Platelets: 225 10*3/uL (ref 145–400)
RBC: 4.5 10*6/uL (ref 3.70–5.32)
RDW: 12.4 % (ref 11.1–15.7)
WBC: 6.7 10*3/uL (ref 3.9–10.0)

## 2012-11-13 MED ORDER — ALPRAZOLAM 2 MG PO TABS: 2.0000 mg | ORAL_TABLET | Freq: Every evening | ORAL | Status: DC | PRN

## 2012-11-13 NOTE — Progress Notes (Signed)
This office note has been dictated.

## 2012-11-15 NOTE — Progress Notes (Signed)
CC:   Pam Drown, M.D.  DIAGNOSES: 1. 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 2. History of pulmonary emboli.  CURRENT THERAPY: 1. Aspirin 162 mg p.o. daily. 2. Vitamin D 1000 units p.o. daily.  INTERIM HISTORY:  Ms. Gullickson comes in for followup.  I last saw her back in June.  Since then, she has done well.  She went over to [place].  She always goes on cruises.  She had a good time with this.  She has had no problems with cough or shortness of breath.  She has had no fever, sweats, or chills.  There has been no change in bowel or bladder habits.  She has had no issues with weight loss or weight gain.  She is trying to exercise a little bit more.  Overall, her performance status is ECOG 0.  PHYSICAL EXAMINATION:  General:  This is a well-developed, well- nourished white female in no obvious distress.  Vital signs:  Show temperature of 98, pulse 76, respiratory rate 14, blood pressure 128/67, weight is 264 pounds.  Head and Neck:  Shows a normocephalic, atraumatic skull.  There are no ocular or oral lesions.  There are no palpable cervical or supraclavicular lymph nodes.  Lungs:  Clear bilaterally. Cardiac:  Regular rate and rhythm with normal S1, S2.  There are no murmurs, rubs, or bruits.  Breasts:  Show bilateral mastectomies. Previously had implants.  These are well-settled.  She has no erythema, warmth, or tenderness above the implants.  There is no bilateral axillary adenopathy.  Abdomen:  Soft.  She has good bowel sounds.  There is no fluid wave.  There is no palpable abdominal mass.  There is no palpable hepatosplenomegaly.  Back:  No tenderness over the spine, ribs, or hips.  Extremities:  Show no clubbing, cyanosis, or edema.  She has good range motion of her joints.  She has good strength in her arms and legs.  Skin:  No rashes, ecchymoses, or petechiae.  I do not see any suspicious hyperpigmented  lesions on her skin.  Neurological:  No focal neurological deficits.  LABORATORY STUDIES:  White cell count is 6.7, hemoglobin 13.8, hematocrit 41.8, platelet count is 225.  IMPRESSION:  Ms. Kevorkian is a very charming 67 year old white female with history of locally recurrent ductal carcinoma of the left breast.  She underwent bilateral mastectomy when she had the recurrence.  She had implants.  We did give her adjuvant chemotherapy, as the recurrence was ER negative.  She did develop a pulmonary embolism with therapy.  She was on Coumadin. We had her on Coumadin for 3 years.  She now is on aspirin.  I do not see any evidence of recurrent disease.  We saw her to be diligent with following her along.  I do not see that we need to do any scans right now.  We will go ahead and plan to get her back in 6 months' time.    ______________________________ Josph Macho, M.D. PRE/MEDQ  D:  11/12/2012  T:  11/14/2012  Job:  [id number]

## 2012-12-31 DIAGNOSIS — Z23 Encounter for immunization: Secondary | ICD-10-CM | POA: Diagnosis not present

## 2013-01-05 DIAGNOSIS — M899 Disorder of bone, unspecified: Secondary | ICD-10-CM | POA: Diagnosis not present

## 2013-01-05 DIAGNOSIS — N959 Unspecified menopausal and perimenopausal disorder: Secondary | ICD-10-CM | POA: Diagnosis not present

## 2013-03-11 DIAGNOSIS — L819 Disorder of pigmentation, unspecified: Secondary | ICD-10-CM | POA: Diagnosis not present

## 2013-03-11 DIAGNOSIS — D239 Other benign neoplasm of skin, unspecified: Secondary | ICD-10-CM | POA: Diagnosis not present

## 2013-03-11 DIAGNOSIS — L821 Other seborrheic keratosis: Secondary | ICD-10-CM | POA: Diagnosis not present

## 2013-03-11 DIAGNOSIS — L820 Inflamed seborrheic keratosis: Secondary | ICD-10-CM | POA: Diagnosis not present

## 2013-03-11 DIAGNOSIS — D1801 Hemangioma of skin and subcutaneous tissue: Secondary | ICD-10-CM | POA: Diagnosis not present

## 2013-04-27 DIAGNOSIS — J040 Acute laryngitis: Secondary | ICD-10-CM | POA: Diagnosis not present

## 2013-05-04 ENCOUNTER — Telehealth: Payer: Self-pay | Admitting: Hematology & Oncology

## 2013-05-04 NOTE — Telephone Encounter (Signed)
Faxed Medical Records via fax today  to:  [facility] Ph: [phone] Fx: [phone]   Medical  Records requested from 2010 to present   [place] SCANNED

## 2013-05-06 DIAGNOSIS — R05 Cough: Secondary | ICD-10-CM | POA: Diagnosis not present

## 2013-05-06 DIAGNOSIS — R059 Cough, unspecified: Secondary | ICD-10-CM | POA: Diagnosis not present

## 2013-05-12 ENCOUNTER — Other Ambulatory Visit (HOSPITAL_BASED_OUTPATIENT_CLINIC_OR_DEPARTMENT_OTHER): Payer: Medicare Other | Admitting: Lab

## 2013-05-12 ENCOUNTER — Ambulatory Visit (HOSPITAL_BASED_OUTPATIENT_CLINIC_OR_DEPARTMENT_OTHER): Payer: Medicare Other | Admitting: Hematology & Oncology

## 2013-05-12 ENCOUNTER — Encounter: Payer: Self-pay | Admitting: Hematology & Oncology

## 2013-05-12 ENCOUNTER — Other Ambulatory Visit: Payer: Self-pay | Admitting: *Deleted

## 2013-05-12 VITALS — BP 129/80 | HR 80 | Temp 97.3°F | Resp 16 | Ht 65.0 in | Wt 161.0 lb

## 2013-05-12 DIAGNOSIS — C50919 Malignant neoplasm of unspecified site of unspecified female breast: Secondary | ICD-10-CM

## 2013-05-12 DIAGNOSIS — D0592 Unspecified type of carcinoma in situ of left breast: Secondary | ICD-10-CM

## 2013-05-12 DIAGNOSIS — J329 Chronic sinusitis, unspecified: Secondary | ICD-10-CM

## 2013-05-12 DIAGNOSIS — Z86711 Personal history of pulmonary embolism: Secondary | ICD-10-CM | POA: Diagnosis not present

## 2013-05-12 LAB — CBC WITH DIFFERENTIAL (CANCER CENTER ONLY)
BASO#: 0 10*3/uL (ref 0.0–0.2)
BASO%: 0.3 % (ref 0.0–2.0)
EOS%: 2.9 % (ref 0.0–7.0)
Eosinophils Absolute: 0.2 10*3/uL (ref 0.0–0.5)
HCT: 43.5 % (ref 34.8–46.6)
HGB: 14.7 g/dL (ref 11.6–15.9)
LYMPH#: 2.4 10*3/uL (ref 0.9–3.3)
LYMPH%: 36.7 % (ref 14.0–48.0)
MCH: 31.7 pg (ref 26.0–34.0)
MCHC: 33.8 g/dL (ref 32.0–36.0)
MCV: 94 fL (ref 81–101)
MONO#: 0.6 10*3/uL (ref 0.1–0.9)
MONO%: 8.9 % (ref 0.0–13.0)
NEUT%: 51.2 % (ref 39.6–80.0)
NEUTROS ABS: 3.3 10*3/uL (ref 1.5–6.5)
Platelets: 261 10*3/uL (ref 145–400)
RBC: 4.64 10*6/uL (ref 3.70–5.32)
RDW: 12.6 % (ref 11.1–15.7)
WBC: 6.5 10*3/uL (ref 3.9–10.0)

## 2013-05-12 LAB — CMP (CANCER CENTER ONLY)
ALT(SGPT): 26 U/L (ref 10–47)
AST: 18 U/L (ref 11–38)
Albumin: 3.5 g/dL (ref 3.3–5.5)
Alkaline Phosphatase: 68 U/L (ref 26–84)
BUN, Bld: 21 mg/dL (ref 7–22)
CALCIUM: 9.7 mg/dL (ref 8.0–10.3)
CHLORIDE: 102 meq/L (ref 98–108)
CO2: 33 mEq/L (ref 18–33)
CREATININE: 0.9 mg/dL (ref 0.6–1.2)
Glucose, Bld: 95 mg/dL (ref 73–118)
POTASSIUM: 3.4 meq/L (ref 3.3–4.7)
Sodium: 142 mEq/L (ref 128–145)
Total Bilirubin: 0.8 mg/dl (ref 0.20–1.60)
Total Protein: 6.7 g/dL (ref 6.4–8.1)

## 2013-05-12 MED ORDER — AZITHROMYCIN 250 MG PO TABS: ORAL_TABLET | ORAL | Status: DC

## 2013-05-12 NOTE — Progress Notes (Deleted)
Hematology and Oncology Follow Up Visit  TAZIYAH IANNUZZI [id number] 1945/09/22 68 y.o. 05/12/2013   Principle Diagnosis:   ***  Current Therapy:    ***     Interim History:  Ms.  Posas is ***  Medications: Current outpatient prescriptions:acyclovir (ZOVIRAX) 200 MG capsule, as needed. , Disp: , Rfl: ;  acyclovir cream (ZOVIRAX) 5 %, Apply topically as needed., Disp: , Rfl: ;  Biotin 1000 MCG tablet, Take 1,000 mcg by mouth daily., Disp: , Rfl: ;  Calcium Carb-Cholecalciferol (CALCIUM + D3) 600-200 MG-UNIT TABS, Take by mouth 2 (two) times daily. 2 in am and 2 in pm, Disp: , Rfl:  Cetirizine HCl (ZYRTEC ALLERGY PO), Take by mouth every morning., Disp: , Rfl: ;  cholecalciferol (VITAMIN D) 1000 UNITS tablet, Take 1,000 Units by mouth daily., Disp: , Rfl: ;  Multiple Vitamins-Minerals (SENIOR MULTIVITAMIN PLUS PO), Take by mouth every morning., Disp: , Rfl: ;  alprazolam (XANAX) 2 MG tablet, Take 1 tablet (2 mg total) by mouth at bedtime as needed for sleep., Disp: 30 tablet, Rfl: 2 azithromycin (ZITHROMAX Z-PAK) 250 MG tablet, Take as directed, Disp: 6 each, Rfl: 3  Allergies:  Allergies  Allergen Reactions  . Sulfonamide Derivatives     Eye drops    Past Medical History, Surgical history, Social history, and Family History were reviewed and updated.  Review of Systems: ***  Physical Exam:  height is 5\' 5"  (1.651 m) and weight is 161 lb (73.029 kg). Her oral temperature is 97.3 F (36.3 C). Her blood pressure is 129/80 and her pulse is 80. Her respiration is 16.   ***  Lab Results  Component Value Date   WBC 6.5 05/12/2013   HGB 14.7 05/12/2013   HCT 43.5 05/12/2013   MCV 94 05/12/2013   PLT 261 05/12/2013     Chemistry      Component Value Date/Time   NA 142 05/12/2013 0943   NA 143 11/13/2012 1434   K 3.4 05/12/2013 0943   K 4.2 11/13/2012 1434   CL 102 05/12/2013 0943   CL 105 11/13/2012 1434   CO2 33 05/12/2013 0943   CO2 29 11/13/2012 1434   BUN 21 05/12/2013 0943   BUN 20 11/13/2012  1434   CREATININE 0.9 05/12/2013 0943   CREATININE 0.66 11/13/2012 1434      Component Value Date/Time   CALCIUM 9.7 05/12/2013 0943   CALCIUM 9.9 11/13/2012 1434   ALKPHOS 68 05/12/2013 0943   ALKPHOS 75 11/13/2012 1434   AST 18 05/12/2013 0943   AST 19 11/13/2012 1434   ALT 26 05/12/2013 0943   ALT 27 11/13/2012 1434   BILITOT 0.80 05/12/2013 0943   BILITOT 0.6 11/13/2012 1434         Impression and Plan: Ms. Cortez is ***   [name], MD 5/5/20151:49 PM

## 2013-05-13 NOTE — Progress Notes (Signed)
CC:   Leitha Bleak, M.D.  DIAGNOSES: 1. 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 2. History of pulmonary emboli.  CURRENT THERAPY: 1. Aspirin 162 mg p.o. daily. 2. Vitamin D 1000 units p.o. daily.  INTERIM HISTORY:  Ms. Musto comes in for followup.  I last saw her back in November.  Since then, she has done well.  She and her husband went over to [place]. They Had a good time. She will be going on a cruise later on this year. She is going to be beach over Mother's Day weekend.  She has had no problems with cough or shortness of breath.  She has had no fever, sweats, or chills.  There has been no change in bowel or bladder habits.  She has had no issues with weight loss or weight gain.  She is trying to exercise a little bit more.  Overall, her performance status is ECOG 0.  PHYSICAL EXAMINATION:  General:  This is a well-developed, well- nourished white female in no obvious distress.  Vital signs:  Show temperature of 97.3, puls 80, respiratory rate 14, blood pressure 129/80 weight is 161 pounds.  Head and Neck:  Shows a normocephalic, atraumatic skull.  There are no ocular or oral lesions.  There are no palpable cervical or supraclavicular lymph nodes.  Lungs:  Clear bilaterally. Cardiac:  Regular rate and rhythm with normal S1, S2.  There are no murmurs, rubs, or bruits.  Breasts:  Show bilateral mastectomies. Previously had implants.  These are well-settled.  She has no erythema, warmth, or tenderness above the implants.  There is no bilateral axillary adenopathy.  Abdomen:  Soft.  She has good bowel sounds.  There is no fluid wave.  There is no palpable abdominal mass.  There is no palpable hepatosplenomegaly.  Back:  No tenderness over the spine, ribs, or hips.  Extremities:  Show no clubbing, cyanosis, or edema.  She has good range motion of her joints.  She has good strength in her arms and legs.  Skin:  No  rashes, ecchymoses, or petechiae.  I do not see any suspicious hyperpigmented lesions on her skin.  Neurological:  No focal neurological deficits.  LABORATORY STUDIES:  White cell count is 6.5, hemoglobin 14.7, hematocrit 43.5, platelet count is 261.  IMPRESSION:  Ms. Redmon is a very charming 68 year old white female with history of locally recurrent ductal carcinoma of the left breast.  She underwent bilateral mastectomy when she had the recurrence.  She had implants.  We did give her adjuvant chemotherapy, as the recurrence was ER negative.  She did develop a pulmonary embolism with therapy.  She was on Coumadin. We had her on Coumadin for 3 years.  She now is on aspirin.  I do not see any evidence of recurrent disease.  We still want to be diligent with following her along.  I do not see that we need to do any scans right now.  We will go ahead and plan to get her back in 6 months' time.

## 2013-05-15 ENCOUNTER — Telehealth: Payer: Self-pay | Admitting: Hematology & Oncology

## 2013-05-15 NOTE — Telephone Encounter (Signed)
Faxed Path Report via fax today to:  [facility]  Ph: [phone]  Fx: [phone]   IDCVU:[id number]  Medical Records requested from 2005 -2011 path report   CONSENT COPY SCANNED

## 2013-10-23 DIAGNOSIS — J029 Acute pharyngitis, unspecified: Secondary | ICD-10-CM | POA: Diagnosis not present

## 2013-10-23 DIAGNOSIS — J309 Allergic rhinitis, unspecified: Secondary | ICD-10-CM | POA: Diagnosis not present

## 2013-10-23 DIAGNOSIS — M25571 Pain in right ankle and joints of right foot: Secondary | ICD-10-CM | POA: Diagnosis not present

## 2013-11-04 DIAGNOSIS — C50912 Malignant neoplasm of unspecified site of left female breast: Secondary | ICD-10-CM | POA: Diagnosis not present

## 2013-11-04 DIAGNOSIS — Z0000 Encounter for general adult medical examination without abnormal findings: Secondary | ICD-10-CM | POA: Diagnosis not present

## 2013-11-04 DIAGNOSIS — Z1389 Encounter for screening for other disorder: Secondary | ICD-10-CM | POA: Diagnosis not present

## 2013-11-04 DIAGNOSIS — E782 Mixed hyperlipidemia: Secondary | ICD-10-CM | POA: Diagnosis not present

## 2013-11-04 DIAGNOSIS — Z23 Encounter for immunization: Secondary | ICD-10-CM | POA: Diagnosis not present

## 2013-11-04 DIAGNOSIS — Z1211 Encounter for screening for malignant neoplasm of colon: Secondary | ICD-10-CM | POA: Diagnosis not present

## 2013-11-04 DIAGNOSIS — E559 Vitamin D deficiency, unspecified: Secondary | ICD-10-CM | POA: Diagnosis not present

## 2013-11-04 DIAGNOSIS — A6000 Herpesviral infection of urogenital system, unspecified: Secondary | ICD-10-CM | POA: Diagnosis not present

## 2013-11-04 DIAGNOSIS — M8580 Other specified disorders of bone density and structure, unspecified site: Secondary | ICD-10-CM | POA: Diagnosis not present

## 2013-11-11 ENCOUNTER — Other Ambulatory Visit: Payer: Self-pay | Admitting: *Deleted

## 2013-11-11 DIAGNOSIS — C50912 Malignant neoplasm of unspecified site of left female breast: Secondary | ICD-10-CM

## 2013-11-12 ENCOUNTER — Encounter: Payer: Self-pay | Admitting: Hematology & Oncology

## 2013-11-12 ENCOUNTER — Other Ambulatory Visit (HOSPITAL_BASED_OUTPATIENT_CLINIC_OR_DEPARTMENT_OTHER): Payer: Medicare Other | Admitting: Lab

## 2013-11-12 ENCOUNTER — Ambulatory Visit (HOSPITAL_BASED_OUTPATIENT_CLINIC_OR_DEPARTMENT_OTHER): Payer: Medicare Other | Admitting: Hematology & Oncology

## 2013-11-12 VITALS — BP 136/70 | HR 74 | Temp 98.2°F | Resp 14 | Ht 65.0 in | Wt 166.0 lb

## 2013-11-12 DIAGNOSIS — Z853 Personal history of malignant neoplasm of breast: Secondary | ICD-10-CM | POA: Diagnosis not present

## 2013-11-12 DIAGNOSIS — Z86711 Personal history of pulmonary embolism: Secondary | ICD-10-CM

## 2013-11-12 DIAGNOSIS — C50912 Malignant neoplasm of unspecified site of left female breast: Secondary | ICD-10-CM

## 2013-11-12 DIAGNOSIS — Z171 Estrogen receptor negative status [ER-]: Principal | ICD-10-CM

## 2013-11-12 DIAGNOSIS — C50911 Malignant neoplasm of unspecified site of right female breast: Secondary | ICD-10-CM

## 2013-11-12 LAB — COMPREHENSIVE METABOLIC PANEL
ALK PHOS: 70 U/L (ref 39–117)
ALT: 20 U/L (ref 0–35)
AST: 16 U/L (ref 0–37)
Albumin: 3.9 g/dL (ref 3.5–5.2)
BUN: 10 mg/dL (ref 6–23)
CALCIUM: 9.7 mg/dL (ref 8.4–10.5)
CO2: 29 mEq/L (ref 19–32)
Chloride: 104 mEq/L (ref 96–112)
Creatinine, Ser: 0.68 mg/dL (ref 0.50–1.10)
Glucose, Bld: 106 mg/dL — ABNORMAL HIGH (ref 70–99)
Potassium: 4.3 mEq/L (ref 3.5–5.3)
Sodium: 141 mEq/L (ref 135–145)
TOTAL PROTEIN: 6.6 g/dL (ref 6.0–8.3)
Total Bilirubin: 0.6 mg/dL (ref 0.2–1.2)

## 2013-11-12 LAB — CBC WITH DIFFERENTIAL (CANCER CENTER ONLY)
BASO#: 0.1 10*3/uL (ref 0.0–0.2)
BASO%: 0.9 % (ref 0.0–2.0)
EOS%: 6 % (ref 0.0–7.0)
Eosinophils Absolute: 0.3 10*3/uL (ref 0.0–0.5)
HCT: 43 % (ref 34.8–46.6)
HEMOGLOBIN: 14.6 g/dL (ref 11.6–15.9)
LYMPH#: 1.8 10*3/uL (ref 0.9–3.3)
LYMPH%: 32.6 % (ref 14.0–48.0)
MCH: 31.7 pg (ref 26.0–34.0)
MCHC: 34 g/dL (ref 32.0–36.0)
MCV: 94 fL (ref 81–101)
MONO#: 0.4 10*3/uL (ref 0.1–0.9)
MONO%: 7.7 % (ref 0.0–13.0)
NEUT#: 2.9 10*3/uL (ref 1.5–6.5)
NEUT%: 52.8 % (ref 39.6–80.0)
Platelets: 256 10*3/uL (ref 145–400)
RBC: 4.6 10*6/uL (ref 3.70–5.32)
RDW: 12 % (ref 11.1–15.7)
WBC: 5.5 10*3/uL (ref 3.9–10.0)

## 2013-11-12 NOTE — Progress Notes (Signed)
Hematology and Oncology Follow Up Visit  Margaret English [id number] 1945-05-17 68 y.o. 11/12/2013   Principle Diagnosis:  1. 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 2. History of pulmonary emboli.  Current Therapy:    Aspirin 162 mg by mouth daily     Interim History:  Margaret English is back for follow-up. We last saw her back in May. Since then, she's been doing quite well. As always, she's been on quite a few cruises. 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 She'll be going on a brand-new ship in a week or so. She is looking for to this.  She's had no problems with her health. There's been no cough. She's had no bleeding. She's had no change in bowel or bladder habits. She's had no nausea or vomiting. There's been no abdominal pain. She's had no bony pain.  There's been no rashes. She's had no swelling.  She did see her family doctor recently. Everything came out real well with her labs. She is waiting for her cholesterol level to come back.  Currently, her performance status is ECOG 0  Medications: Current outpatient prescriptions: acyclovir (ZOVIRAX) 200 MG capsule, as needed. , Disp: , Rfl: ;  acyclovir cream (ZOVIRAX) 5 %, Apply topically as needed., Disp: , Rfl: ;  alprazolam (XANAX) 2 MG tablet, Take 1 tablet (2 mg total) by mouth at bedtime as needed for sleep., Disp: 30 tablet, Rfl: 2;  azithromycin (ZITHROMAX Z-PAK) 250 MG tablet, Take as directed, Disp: 6 each, Rfl: 3 Calcium Citrate-Vitamin D (CALCIUM CITRATE + D PO), Take 2 tablets by mouth., Disp: , Rfl: ;  Cetirizine HCl (ZYRTEC ALLERGY PO), Take by mouth every morning., Disp: , Rfl: ;  cholecalciferol (VITAMIN D) 1000 UNITS tablet, Take 1,000 Units by mouth daily. 2 or 3 tabs daily, Disp: , Rfl: ;  Multiple Vitamins-Minerals (SENIOR MULTIVITAMIN PLUS PO), Take by mouth every morning., Disp: , Rfl:   Allergies:  Allergies  Allergen Reactions  . Sulfonamide  Derivatives     Eye drops    Past Medical History, Surgical history, Social history, and Family History were reviewed and updated.  Review of Systems: As above  Physical Exam:  height is 5\' 5"  (1.651 m) and weight is 166 lb (75.297 kg). Her oral temperature is 98.2 F (36.8 C). Her blood pressure is 136/70 and her pulse is 74. Her respiration is 14.   Well-developed and well-nourished white female in no obvious distress. Head and neck exam shows no ocular or oral lesions. There are no palpable cervical or supraclavicular lymph nodes. Lungs are clear. Cardiac exam regular rate and rhythm with no murmurs, rubs or bruits. Breast exam shows bilateral mastectomies. She has implants. She has no nodularity. There is no erythema. There is no bilateral axillary adenopathy. Abdomen is soft. Shows good bowel sounds. There is no fluid wave. There is no palpable liver or spleen tip. Back exam shows no tenderness over the spine, ribs or hips. Extremities shows no clubbing, cyanosis or edema. No lymphedema is noted in her arms. She has good range of motion of her extremities. She has good strength. Skin exam shows no rashes, ecchymoses or petechia. Neurological exam is nonfocal.  Lab Results  Component Value Date   WBC 5.5 11/12/2013   HGB 14.6 11/12/2013   HCT 43.0 11/12/2013   MCV 94 11/12/2013   PLT 256 11/12/2013     Chemistry  Component Value Date/Time   NA 141 11/12/2013 0935   NA 142 05/12/2013 0943   K 4.3 11/12/2013 0935   K 3.4 05/12/2013 0943   CL 104 11/12/2013 0935   CL 102 05/12/2013 0943   CO2 29 11/12/2013 0935   CO2 33 05/12/2013 0943   BUN 10 11/12/2013 0935   BUN 21 05/12/2013 0943   CREATININE 0.68 11/12/2013 0935   CREATININE 0.9 05/12/2013 0943      Component Value Date/Time   CALCIUM 9.7 11/12/2013 0935   CALCIUM 9.7 05/12/2013 0943   ALKPHOS 70 11/12/2013 0935   ALKPHOS 68 05/12/2013 0943   AST 16 11/12/2013 0935   AST 18 05/12/2013 0943   ALT 20 11/12/2013  0935   ALT 26 05/12/2013 0943   BILITOT 0.6 11/12/2013 0935   BILITOT 0.80 05/12/2013 0943         Impression and Plan: Margaret English is 68 year old white female. She has a history of recurrent ductal carcinoma the left breast. She underwent systemic chemotherapy for this. She has been out of therapy now for 7 or 8 years. 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                                        Again, I do not see any evidence of recurrence.  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                                    She has a fantastic quality of life right now. She is very proactive going on cruises.  I will plan to see her back in another 6 months.   [name], MD 11/5/20156:21 PM

## 2013-11-27 ENCOUNTER — Encounter: Payer: Self-pay | Admitting: Internal Medicine

## 2013-12-24 DIAGNOSIS — Z1211 Encounter for screening for malignant neoplasm of colon: Secondary | ICD-10-CM | POA: Diagnosis not present

## 2014-01-15 DIAGNOSIS — H18411 Arcus senilis, right eye: Secondary | ICD-10-CM | POA: Diagnosis not present

## 2014-01-15 DIAGNOSIS — H02839 Dermatochalasis of unspecified eye, unspecified eyelid: Secondary | ICD-10-CM | POA: Diagnosis not present

## 2014-01-15 DIAGNOSIS — H2511 Age-related nuclear cataract, right eye: Secondary | ICD-10-CM | POA: Diagnosis not present

## 2014-02-15 DIAGNOSIS — H25812 Combined forms of age-related cataract, left eye: Secondary | ICD-10-CM | POA: Diagnosis not present

## 2014-02-15 DIAGNOSIS — H2512 Age-related nuclear cataract, left eye: Secondary | ICD-10-CM | POA: Diagnosis not present

## 2014-02-16 DIAGNOSIS — H2511 Age-related nuclear cataract, right eye: Secondary | ICD-10-CM | POA: Diagnosis not present

## 2014-02-16 DIAGNOSIS — H25011 Cortical age-related cataract, right eye: Secondary | ICD-10-CM | POA: Diagnosis not present

## 2014-02-16 DIAGNOSIS — H25041 Posterior subcapsular polar age-related cataract, right eye: Secondary | ICD-10-CM | POA: Diagnosis not present

## 2014-02-19 ENCOUNTER — Other Ambulatory Visit: Payer: Self-pay | Admitting: *Deleted

## 2014-02-19 DIAGNOSIS — J329 Chronic sinusitis, unspecified: Secondary | ICD-10-CM

## 2014-02-19 DIAGNOSIS — C50919 Malignant neoplasm of unspecified site of unspecified female breast: Secondary | ICD-10-CM

## 2014-02-19 MED ORDER — AZITHROMYCIN 250 MG PO TABS: ORAL_TABLET | ORAL | Status: DC

## 2014-03-01 DIAGNOSIS — H25811 Combined forms of age-related cataract, right eye: Secondary | ICD-10-CM | POA: Diagnosis not present

## 2014-03-01 DIAGNOSIS — H2511 Age-related nuclear cataract, right eye: Secondary | ICD-10-CM | POA: Diagnosis not present

## 2014-03-17 DIAGNOSIS — D485 Neoplasm of uncertain behavior of skin: Secondary | ICD-10-CM | POA: Diagnosis not present

## 2014-03-17 DIAGNOSIS — L821 Other seborrheic keratosis: Secondary | ICD-10-CM | POA: Diagnosis not present

## 2014-03-17 DIAGNOSIS — L819 Disorder of pigmentation, unspecified: Secondary | ICD-10-CM | POA: Diagnosis not present

## 2014-03-17 DIAGNOSIS — L719 Rosacea, unspecified: Secondary | ICD-10-CM | POA: Diagnosis not present

## 2014-03-17 DIAGNOSIS — Z86018 Personal history of other benign neoplasm: Secondary | ICD-10-CM | POA: Diagnosis not present

## 2014-03-17 DIAGNOSIS — L814 Other melanin hyperpigmentation: Secondary | ICD-10-CM | POA: Diagnosis not present

## 2014-03-18 DIAGNOSIS — L820 Inflamed seborrheic keratosis: Secondary | ICD-10-CM | POA: Diagnosis not present

## 2014-05-13 ENCOUNTER — Other Ambulatory Visit (HOSPITAL_BASED_OUTPATIENT_CLINIC_OR_DEPARTMENT_OTHER): Payer: Medicare Other

## 2014-05-13 ENCOUNTER — Ambulatory Visit (HOSPITAL_BASED_OUTPATIENT_CLINIC_OR_DEPARTMENT_OTHER): Payer: Medicare Other | Admitting: Hematology & Oncology

## 2014-05-13 ENCOUNTER — Encounter: Payer: Self-pay | Admitting: Hematology & Oncology

## 2014-05-13 VITALS — BP 131/63 | HR 77 | Temp 97.8°F | Resp 14 | Ht 65.0 in | Wt 165.0 lb

## 2014-05-13 DIAGNOSIS — R590 Localized enlarged lymph nodes: Secondary | ICD-10-CM

## 2014-05-13 DIAGNOSIS — M810 Age-related osteoporosis without current pathological fracture: Secondary | ICD-10-CM | POA: Diagnosis not present

## 2014-05-13 DIAGNOSIS — R599 Enlarged lymph nodes, unspecified: Secondary | ICD-10-CM

## 2014-05-13 DIAGNOSIS — Z853 Personal history of malignant neoplasm of breast: Secondary | ICD-10-CM

## 2014-05-13 DIAGNOSIS — Z171 Estrogen receptor negative status [ER-]: Principal | ICD-10-CM

## 2014-05-13 DIAGNOSIS — C50911 Malignant neoplasm of unspecified site of right female breast: Secondary | ICD-10-CM | POA: Diagnosis not present

## 2014-05-13 DIAGNOSIS — C50919 Malignant neoplasm of unspecified site of unspecified female breast: Secondary | ICD-10-CM | POA: Diagnosis not present

## 2014-05-13 DIAGNOSIS — Z86718 Personal history of other venous thrombosis and embolism: Secondary | ICD-10-CM | POA: Diagnosis not present

## 2014-05-13 LAB — CBC WITH DIFFERENTIAL (CANCER CENTER ONLY)
BASO#: 0.1 10*3/uL (ref 0.0–0.2)
BASO%: 1 % (ref 0.0–2.0)
EOS ABS: 0.2 10*3/uL (ref 0.0–0.5)
EOS%: 4.5 % (ref 0.0–7.0)
HCT: 41.9 % (ref 34.8–46.6)
HGB: 14.2 g/dL (ref 11.6–15.9)
LYMPH#: 1.8 10*3/uL (ref 0.9–3.3)
LYMPH%: 34.4 % (ref 14.0–48.0)
MCH: 31.9 pg (ref 26.0–34.0)
MCHC: 33.9 g/dL (ref 32.0–36.0)
MCV: 94 fL (ref 81–101)
MONO#: 0.4 10*3/uL (ref 0.1–0.9)
MONO%: 8 % (ref 0.0–13.0)
NEUT%: 52.1 % (ref 39.6–80.0)
NEUTROS ABS: 2.7 10*3/uL (ref 1.5–6.5)
PLATELETS: 252 10*3/uL (ref 145–400)
RBC: 4.45 10*6/uL (ref 3.70–5.32)
RDW: 12.2 % (ref 11.1–15.7)
WBC: 5.1 10*3/uL (ref 3.9–10.0)

## 2014-05-13 LAB — COMPREHENSIVE METABOLIC PANEL
ALT: 16 U/L (ref 0–35)
AST: 17 U/L (ref 0–37)
Albumin: 3.8 g/dL (ref 3.5–5.2)
Alkaline Phosphatase: 73 U/L (ref 39–117)
BUN: 17 mg/dL (ref 6–23)
CO2: 31 mEq/L (ref 19–32)
CREATININE: 0.73 mg/dL (ref 0.50–1.10)
Calcium: 9.3 mg/dL (ref 8.4–10.5)
Chloride: 104 mEq/L (ref 96–112)
Glucose, Bld: 80 mg/dL (ref 70–99)
POTASSIUM: 4.1 meq/L (ref 3.5–5.3)
Sodium: 144 mEq/L (ref 135–145)
Total Bilirubin: 0.7 mg/dL (ref 0.2–1.2)
Total Protein: 6.5 g/dL (ref 6.0–8.3)

## 2014-05-14 ENCOUNTER — Ambulatory Visit (HOSPITAL_BASED_OUTPATIENT_CLINIC_OR_DEPARTMENT_OTHER)
Admission: RE | Admit: 2014-05-14 | Discharge: 2014-05-14 | Disposition: A | Discharge status: Home / Self Care | Payer: Medicare Other | Source: Ambulatory Visit | Attending: Hematology & Oncology | Admitting: Hematology & Oncology

## 2014-05-14 ENCOUNTER — Encounter (HOSPITAL_BASED_OUTPATIENT_CLINIC_OR_DEPARTMENT_OTHER): Payer: Self-pay

## 2014-05-14 DIAGNOSIS — J029 Acute pharyngitis, unspecified: Secondary | ICD-10-CM | POA: Diagnosis not present

## 2014-05-14 DIAGNOSIS — R221 Localized swelling, mass and lump, neck: Secondary | ICD-10-CM | POA: Insufficient documentation

## 2014-05-14 DIAGNOSIS — Z853 Personal history of malignant neoplasm of breast: Secondary | ICD-10-CM | POA: Insufficient documentation

## 2014-05-14 DIAGNOSIS — R590 Localized enlarged lymph nodes: Secondary | ICD-10-CM

## 2014-05-14 HISTORY — DX: Malignant (primary) neoplasm, unspecified: C80.1

## 2014-05-14 MED ORDER — IOHEXOL 300 MG/ML  SOLN
80.0000 mL | Freq: Once | INTRAMUSCULAR | Status: AC | PRN
  Administered 2014-05-14: 80 mL via INTRAVENOUS

## 2014-05-14 NOTE — Progress Notes (Signed)
Hematology and Oncology Follow Up Visit  Margaret English [id number] 1945/09/02 69 y.o. 05/14/2014   Principle Diagnosis:  1. 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 2. History of pulmonary emboli.  Current Therapy:    Aspirin 162 mg by mouth daily     Interim History:  [name] is back for follow-up. We last saw her back in November. Since then, she's been doing quite well. She put on had a bad strep infection. She's noticed some fullness in the right supraclavicular region. It is not tender. She is a little concerned about this. I think the easiest way to go would be to get a CT scan. She is at significant risk for another recurrence of her breast cancer.  She otherwise, has been doing okay. There's been no issues with bleeding. She's had no cough. She's had no shortness of breath. There's been no cardiac issues. She's had no nausea vomiting. She's had no change in bowel or bladder habits.  She is taking her vitamin D. She has no issues with joint pain. She is staying active.  As always, she and her husband travel. She does cruise reservations. She has been on several new cruise ships.   Currently, her performance status is ECOG 0  Medications:  Current outpatient prescriptions:  .  acyclovir (ZOVIRAX) 200 MG capsule, as needed. , Disp: , Rfl:  .  acyclovir cream (ZOVIRAX) 5 %, Apply topically as needed., Disp: , Rfl:  .  Calcium Citrate-Vitamin D (CALCIUM CITRATE + D PO), Take 2 tablets by mouth 2 (two) times daily. , Disp: , Rfl:  .  Cetirizine HCl (ZYRTEC ALLERGY PO), Take by mouth every morning., Disp: , Rfl:  .  cholecalciferol (VITAMIN D) 1000 UNITS tablet, Take 2,000 Units by mouth daily. 2 or 3 tabs daily, Disp: , Rfl:  .  Multiple Vitamins-Minerals (SENIOR MULTIVITAMIN PLUS PO), Take by mouth every morning., Disp: , Rfl:  .  alprazolam (XANAX) 2 MG tablet, Take 1 tablet (2 mg total) by mouth at bedtime as needed for  sleep. (Patient not taking: Reported on 05/13/2014), Disp: 30 tablet, Rfl: 2 .  azithromycin (ZITHROMAX Z-PAK) 250 MG tablet, Take as directed (Patient not taking: Reported on 05/13/2014), Disp: 6 each, Rfl: 2  Allergies:  Allergies  Allergen Reactions  . Sulfa Antibiotics     Other reaction(s): Other Drainage and red eyes only allergic to sulfa eye drops  . Sulfonamide Derivatives     Eye drops    Past Medical History, Surgical history, Social history, and Family History were reviewed and updated.  Review of Systems: As above  Physical Exam:  height is 5\' 5"  (1.651 m) and weight is 165 lb (74.844 kg). Her oral temperature is 97.8 F (36.6 C). Her blood pressure is 131/63 and her pulse is 77. Her respiration is 14.   Well-developed and well-nourished white female in no obvious distress. Head and neck exam shows no ocular or oral lesions. There are no palpable cervical or supraclavicular lymph nodes. There is fullness in the right supraclavicular region. Lungs are clear. Cardiac exam regular rate and rhythm with no murmurs, rubs or bruits. Breast exam shows bilateral mastectomies. She has implants. She has no nodularity. There is no erythema. There is no bilateral axillary adenopathy. Abdomen is soft. Shows good bowel sounds. There is no fluid wave. There is no palpable liver or spleen tip. Back exam shows no tenderness over the spine,  ribs or hips. Extremities shows no clubbing, cyanosis or edema. No lymphedema is noted in her arms. She has good range of motion of her extremities. She has good strength. Skin exam shows no rashes, ecchymoses or petechia. Neurological exam is nonfocal.  Lab Results  Component Value Date   WBC 5.1 05/13/2014   HGB 14.2 05/13/2014   HCT 41.9 05/13/2014   MCV 94 05/13/2014   PLT 252 05/13/2014     Chemistry      Component Value Date/Time   NA 144 05/13/2014 0949   NA 142 05/12/2013 0943   K 4.1 05/13/2014 0949   K 3.4 05/12/2013 0943   CL 104 05/13/2014  0949   CL 102 05/12/2013 0943   CO2 31 05/13/2014 0949   CO2 33 05/12/2013 0943   BUN 17 05/13/2014 0949   BUN 21 05/12/2013 0943   CREATININE 0.73 05/13/2014 0949   CREATININE 0.9 05/12/2013 0943      Component Value Date/Time   CALCIUM 9.3 05/13/2014 0949   CALCIUM 9.7 05/12/2013 0943   ALKPHOS 73 05/13/2014 0949   ALKPHOS 68 05/12/2013 0943   AST 17 05/13/2014 0949   AST 18 05/12/2013 0943   ALT 16 05/13/2014 0949   ALT 26 05/12/2013 0943   BILITOT 0.7 05/13/2014 0949   BILITOT 0.80 05/12/2013 0943         Impression and Plan: Margaret English is 69 year old white female. She has a history of recurrent ductal carcinoma the left breast. She underwent systemic chemotherapy for this. She has been out of therapy now for 8 years. 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                                        Again, I do not see any evidence of obvious recurrence. However, I still think that a CT scan of the neck would be helpful. She is at risk for recurrence and want to make sure that we are not "cavalier" in our evaluation.  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                                    She has a fantastic quality of life right now. She is very busy going on cruises.  I will plan to see her back in another 6 months.   [name], MD 5/6/20167:11 AM

## 2014-05-17 ENCOUNTER — Telehealth: Payer: Self-pay | Admitting: *Deleted

## 2014-05-17 NOTE — Telephone Encounter (Signed)
Message put on patient personal cell phone

## 2014-05-17 NOTE — Telephone Encounter (Signed)
-----   Message from [name], MD sent at 05/17/2014  7:28 AM EDT ----- Call - NO obvious abnormal lymph node swelling!!!  Nothing looks suspicious for cancer!! pete

## 2014-07-26 ENCOUNTER — Encounter: Payer: Self-pay | Admitting: Genetic Counselor

## 2014-09-02 ENCOUNTER — Other Ambulatory Visit: Payer: Self-pay | Admitting: *Deleted

## 2014-09-02 DIAGNOSIS — C50919 Malignant neoplasm of unspecified site of unspecified female breast: Secondary | ICD-10-CM

## 2014-09-02 DIAGNOSIS — J329 Chronic sinusitis, unspecified: Secondary | ICD-10-CM

## 2014-09-02 MED ORDER — AZITHROMYCIN 250 MG PO TABS
ORAL_TABLET | ORAL | Status: DC
Start: 2014-09-02 — End: 2015-09-29

## 2014-09-28 DIAGNOSIS — N9089 Other specified noninflammatory disorders of vulva and perineum: Secondary | ICD-10-CM | POA: Diagnosis not present

## 2014-11-11 ENCOUNTER — Ambulatory Visit: Payer: Medicare Other | Admitting: Hematology & Oncology

## 2014-11-11 ENCOUNTER — Other Ambulatory Visit: Payer: Medicare Other

## 2014-11-11 DIAGNOSIS — E559 Vitamin D deficiency, unspecified: Secondary | ICD-10-CM | POA: Diagnosis not present

## 2014-11-11 DIAGNOSIS — E782 Mixed hyperlipidemia: Secondary | ICD-10-CM | POA: Diagnosis not present

## 2014-11-11 DIAGNOSIS — M85851 Other specified disorders of bone density and structure, right thigh: Secondary | ICD-10-CM | POA: Diagnosis not present

## 2014-11-11 DIAGNOSIS — Z0000 Encounter for general adult medical examination without abnormal findings: Secondary | ICD-10-CM | POA: Diagnosis not present

## 2014-11-11 DIAGNOSIS — C50912 Malignant neoplasm of unspecified site of left female breast: Secondary | ICD-10-CM | POA: Diagnosis not present

## 2014-11-11 DIAGNOSIS — A6000 Herpesviral infection of urogenital system, unspecified: Secondary | ICD-10-CM | POA: Diagnosis not present

## 2014-11-11 DIAGNOSIS — Z131 Encounter for screening for diabetes mellitus: Secondary | ICD-10-CM | POA: Diagnosis not present

## 2014-11-11 DIAGNOSIS — Z1389 Encounter for screening for other disorder: Secondary | ICD-10-CM | POA: Diagnosis not present

## 2014-11-11 DIAGNOSIS — Z23 Encounter for immunization: Secondary | ICD-10-CM | POA: Diagnosis not present

## 2014-12-13 ENCOUNTER — Other Ambulatory Visit (HOSPITAL_BASED_OUTPATIENT_CLINIC_OR_DEPARTMENT_OTHER): Payer: Medicare Other

## 2014-12-13 ENCOUNTER — Encounter: Payer: Self-pay | Admitting: Hematology & Oncology

## 2014-12-13 ENCOUNTER — Ambulatory Visit (HOSPITAL_BASED_OUTPATIENT_CLINIC_OR_DEPARTMENT_OTHER): Payer: Medicare Other | Admitting: Hematology & Oncology

## 2014-12-13 VITALS — BP 144/75 | HR 70 | Temp 97.6°F | Resp 18 | Ht 65.0 in | Wt 167.0 lb

## 2014-12-13 DIAGNOSIS — M818 Other osteoporosis without current pathological fracture: Secondary | ICD-10-CM

## 2014-12-13 DIAGNOSIS — Z853 Personal history of malignant neoplasm of breast: Secondary | ICD-10-CM

## 2014-12-13 DIAGNOSIS — C50012 Malignant neoplasm of nipple and areola, left female breast: Secondary | ICD-10-CM

## 2014-12-13 DIAGNOSIS — T386X5A Adverse effect of antigonadotrophins, antiestrogens, antiandrogens, not elsewhere classified, initial encounter: Secondary | ICD-10-CM

## 2014-12-13 DIAGNOSIS — R590 Localized enlarged lymph nodes: Secondary | ICD-10-CM

## 2014-12-13 DIAGNOSIS — Z86711 Personal history of pulmonary embolism: Secondary | ICD-10-CM

## 2014-12-13 LAB — CBC WITH DIFFERENTIAL (CANCER CENTER ONLY)
BASO#: 0 10*3/uL (ref 0.0–0.2)
BASO%: 0.9 % (ref 0.0–2.0)
EOS%: 5.2 % (ref 0.0–7.0)
Eosinophils Absolute: 0.2 10*3/uL (ref 0.0–0.5)
HEMATOCRIT: 43 % (ref 34.8–46.6)
HGB: 14.4 g/dL (ref 11.6–15.9)
LYMPH#: 1.6 10*3/uL (ref 0.9–3.3)
LYMPH%: 33.5 % (ref 14.0–48.0)
MCH: 31.4 pg (ref 26.0–34.0)
MCHC: 33.5 g/dL (ref 32.0–36.0)
MCV: 94 fL (ref 81–101)
MONO#: 0.5 10*3/uL (ref 0.1–0.9)
MONO%: 9.9 % (ref 0.0–13.0)
NEUT%: 50.5 % (ref 39.6–80.0)
NEUTROS ABS: 2.3 10*3/uL (ref 1.5–6.5)
Platelets: 220 10*3/uL (ref 145–400)
RBC: 4.58 10*6/uL (ref 3.70–5.32)
RDW: 12.2 % (ref 11.1–15.7)
WBC: 4.6 10*3/uL (ref 3.9–10.0)

## 2014-12-13 LAB — COMPREHENSIVE METABOLIC PANEL
ALBUMIN: 3.8 g/dL (ref 3.5–5.0)
ALK PHOS: 81 U/L (ref 40–150)
ALT: 24 U/L (ref 0–55)
AST: 26 U/L (ref 5–34)
Anion Gap: 7 mEq/L (ref 3–11)
BUN: 13.7 mg/dL (ref 7.0–26.0)
CO2: 30 mEq/L — ABNORMAL HIGH (ref 22–29)
CREATININE: 0.8 mg/dL (ref 0.6–1.1)
Calcium: 9.9 mg/dL (ref 8.4–10.4)
Chloride: 105 mEq/L (ref 98–109)
EGFR: 81 mL/min/{1.73_m2} — ABNORMAL LOW (ref >90)
Glucose: 89 mg/dl (ref 70–140)
POTASSIUM: 4.2 meq/L (ref 3.5–5.1)
Sodium: 142 mEq/L (ref 136–145)
Total Bilirubin: 0.66 mg/dL (ref 0.20–1.20)
Total Protein: 6.8 g/dL (ref 6.4–8.3)

## 2014-12-13 NOTE — Progress Notes (Signed)
Hematology and Oncology Follow Up Visit  Margaret English [id number] 03/31/1945 69 y.o. 12/13/2014   Principle Diagnosis:  1. 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 2. History of pulmonary emboli.  Current Therapy:    Aspirin 162 mg by mouth daily     Interim History:  Margaret English is back for follow-up. She is doing quite well. Last saw her back in May. As always, she has been traveling. 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  She's had no problems with infections. She's had no fever. She's had no nausea or vomiting.  She's had no bleeding.  She's had no leg swelling.  Is not been a positive weight loss or weight gain.  She's had a good Thanksgiving. She is with her family.  She has had a bilateral mastectomy so she does not need mammograms.  Currently, her performance status is ECOG 0  Medications:  Current outpatient prescriptions:  .  acyclovir (ZOVIRAX) 200 MG capsule, as needed. , Disp: , Rfl:  .  acyclovir cream (ZOVIRAX) 5 %, Apply topically as needed., Disp: , Rfl:  .  alprazolam (XANAX) 2 MG tablet, Take 1 tablet (2 mg total) by mouth at bedtime as needed for sleep., Disp: 30 tablet, Rfl: 2 .  azithromycin (ZITHROMAX Z-PAK) 250 MG tablet, Take as directed, Disp: 6 each, Rfl: 2 .  Calcium Citrate-Vitamin D (CALCIUM CITRATE + D PO), Take 2 tablets by mouth 2 (two) times daily. , Disp: , Rfl:  .  Cetirizine HCl (ZYRTEC ALLERGY PO), Take by mouth every morning., Disp: , Rfl:  .  cholecalciferol (VITAMIN D) 1000 UNITS tablet, Take 2,000 Units by mouth daily. 2 or 3 tabs daily, Disp: , Rfl:  .  Multiple Vitamins-Minerals (SENIOR MULTIVITAMIN PLUS PO), Take by mouth every morning., Disp: , Rfl:   Allergies:  Allergies  Allergen Reactions  . Sulfa Antibiotics     Other reaction(s): Other Drainage and red eyes only allergic to sulfa eye  drops  . Sulfonamide Derivatives     Eye drops    Past Medical History, Surgical history, Social history, and Family History were reviewed and updated.  Review of Systems: As above  Physical Exam:  height is 5\' 5"  (1.651 m) and weight is 167 lb (75.751 kg). Her oral temperature is 97.6 F (36.4 C). Her blood pressure is 144/75 and her pulse is 70. Her respiration is 18.   Well-developed and well-nourished white female in no obvious distress. Head and neck exam shows no ocular or oral lesions. There are no palpable cervical or supraclavicular lymph nodes. There is fullness in the right supraclavicular region. Lungs are clear. Cardiac exam regular rate and rhythm with no murmurs, rubs or bruits. Breast exam shows bilateral mastectomies. She has implants. She has no nodularity. There is no erythema. There is no bilateral axillary adenopathy. Abdomen is soft. Shows good bowel sounds. There is no fluid wave. There is no palpable liver or spleen tip. Back exam shows no tenderness over the spine, ribs or hips. Extremities shows no clubbing, cyanosis or edema. No lymphedema is noted in her arms. She has good range of motion of her extremities. She has good strength. Skin exam shows no rashes, ecchymoses or petechia. Neurological exam is nonfocal.  Lab Results  Component Value Date   WBC 4.6 12/13/2014  HGB 14.4 12/13/2014   HCT 43.0 12/13/2014   MCV 94 12/13/2014   PLT 220 12/13/2014     Chemistry      Component Value Date/Time   NA 144 05/13/2014 0949   NA 142 05/12/2013 0943   K 4.1 05/13/2014 0949   K 3.4 05/12/2013 0943   CL 104 05/13/2014 0949   CL 102 05/12/2013 0943   CO2 31 05/13/2014 0949   CO2 33 05/12/2013 0943   BUN 17 05/13/2014 0949   BUN 21 05/12/2013 0943   CREATININE 0.73 05/13/2014 0949   CREATININE 0.9 05/12/2013 0943      Component Value Date/Time   CALCIUM 9.3 05/13/2014 0949   CALCIUM 9.7 05/12/2013 0943   ALKPHOS 73 05/13/2014 0949   ALKPHOS 68 05/12/2013  0943   AST 17 05/13/2014 0949   AST 18 05/12/2013 0943   ALT 16 05/13/2014 0949   ALT 26 05/12/2013 0943   BILITOT 0.7 05/13/2014 0949   BILITOT 0.80 05/12/2013 0943         Impression and Plan: Margaret English is 69 year old white female. She has a history of recurrent ductal carcinoma the left breast. She underwent systemic chemotherapy for this. She has been out of therapy now for 9 years. 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                                        Again, I do not see any evidence of obvious recurrence. However, I still think that a CT scan of the neck would be helpful. She is at risk for recurrence and want to make sure that we are not "cavalier" in our evaluation.  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                                    She has a fantastic quality of life right now. She is very busy going on cruises.  I will plan to see her back in another 6 months.   [name], MD 12/5/201612:31 PM

## 2015-01-11 DIAGNOSIS — M85851 Other specified disorders of bone density and structure, right thigh: Secondary | ICD-10-CM | POA: Diagnosis not present

## 2015-01-19 DIAGNOSIS — M818 Other osteoporosis without current pathological fracture: Secondary | ICD-10-CM | POA: Diagnosis not present

## 2015-03-02 ENCOUNTER — Encounter: Payer: Self-pay | Admitting: Internal Medicine

## 2015-03-23 DIAGNOSIS — D1800 Hemangioma unspecified site: Secondary | ICD-10-CM | POA: Diagnosis not present

## 2015-03-23 DIAGNOSIS — L918 Other hypertrophic disorders of the skin: Secondary | ICD-10-CM | POA: Diagnosis not present

## 2015-03-23 DIAGNOSIS — L821 Other seborrheic keratosis: Secondary | ICD-10-CM | POA: Diagnosis not present

## 2015-03-23 DIAGNOSIS — D225 Melanocytic nevi of trunk: Secondary | ICD-10-CM | POA: Diagnosis not present

## 2015-03-23 DIAGNOSIS — I872 Venous insufficiency (chronic) (peripheral): Secondary | ICD-10-CM | POA: Diagnosis not present

## 2015-03-23 DIAGNOSIS — L570 Actinic keratosis: Secondary | ICD-10-CM | POA: Diagnosis not present

## 2015-03-23 DIAGNOSIS — Z86018 Personal history of other benign neoplasm: Secondary | ICD-10-CM | POA: Diagnosis not present

## 2015-03-23 DIAGNOSIS — L814 Other melanin hyperpigmentation: Secondary | ICD-10-CM | POA: Diagnosis not present

## 2015-03-23 DIAGNOSIS — Z23 Encounter for immunization: Secondary | ICD-10-CM | POA: Diagnosis not present

## 2015-06-13 ENCOUNTER — Ambulatory Visit (HOSPITAL_BASED_OUTPATIENT_CLINIC_OR_DEPARTMENT_OTHER): Payer: Medicare Other | Admitting: Hematology & Oncology

## 2015-06-13 ENCOUNTER — Other Ambulatory Visit (HOSPITAL_BASED_OUTPATIENT_CLINIC_OR_DEPARTMENT_OTHER): Payer: Medicare Other

## 2015-06-13 ENCOUNTER — Encounter: Payer: Self-pay | Admitting: Hematology & Oncology

## 2015-06-13 ENCOUNTER — Telehealth: Payer: Self-pay | Admitting: *Deleted

## 2015-06-13 VITALS — BP 136/72 | HR 79 | Temp 98.1°F | Resp 16 | Ht 65.0 in | Wt 166.0 lb

## 2015-06-13 DIAGNOSIS — Z853 Personal history of malignant neoplasm of breast: Secondary | ICD-10-CM

## 2015-06-13 DIAGNOSIS — C50012 Malignant neoplasm of nipple and areola, left female breast: Secondary | ICD-10-CM | POA: Diagnosis not present

## 2015-06-13 DIAGNOSIS — M818 Other osteoporosis without current pathological fracture: Secondary | ICD-10-CM | POA: Diagnosis not present

## 2015-06-13 DIAGNOSIS — C50011 Malignant neoplasm of nipple and areola, right female breast: Secondary | ICD-10-CM

## 2015-06-13 DIAGNOSIS — T386X5A Adverse effect of antigonadotrophins, antiestrogens, antiandrogens, not elsewhere classified, initial encounter: Secondary | ICD-10-CM | POA: Diagnosis not present

## 2015-06-13 DIAGNOSIS — Z86711 Personal history of pulmonary embolism: Secondary | ICD-10-CM

## 2015-06-13 LAB — CBC WITH DIFFERENTIAL (CANCER CENTER ONLY)
BASO#: 0 10*3/uL (ref 0.0–0.2)
BASO%: 0.7 % (ref 0.0–2.0)
EOS%: 4.2 % (ref 0.0–7.0)
Eosinophils Absolute: 0.2 10*3/uL (ref 0.0–0.5)
HCT: 41.6 % (ref 34.8–46.6)
HGB: 14.2 g/dL (ref 11.6–15.9)
LYMPH#: 1.3 10*3/uL (ref 0.9–3.3)
LYMPH%: 27.6 % (ref 14.0–48.0)
MCH: 32.1 pg (ref 26.0–34.0)
MCHC: 34.1 g/dL (ref 32.0–36.0)
MCV: 94 fL (ref 81–101)
MONO#: 0.3 10*3/uL (ref 0.1–0.9)
MONO%: 6.6 % (ref 0.0–13.0)
NEUT#: 2.8 10*3/uL (ref 1.5–6.5)
NEUT%: 60.9 % (ref 39.6–80.0)
PLATELETS: 221 10*3/uL (ref 145–400)
RBC: 4.42 10*6/uL (ref 3.70–5.32)
RDW: 12.2 % (ref 11.1–15.7)
WBC: 4.6 10*3/uL (ref 3.9–10.0)

## 2015-06-13 LAB — COMPREHENSIVE METABOLIC PANEL
ALT: 24 U/L (ref 0–55)
AST: 22 U/L (ref 5–34)
Albumin: 3.7 g/dL (ref 3.5–5.0)
Alkaline Phosphatase: 63 U/L (ref 40–150)
Anion Gap: 7 mEq/L (ref 3–11)
BUN: 14.7 mg/dL (ref 7.0–26.0)
CHLORIDE: 106 meq/L (ref 98–109)
CO2: 30 mEq/L — ABNORMAL HIGH (ref 22–29)
Calcium: 9.7 mg/dL (ref 8.4–10.4)
Creatinine: 0.8 mg/dL (ref 0.6–1.1)
EGFR: 77 mL/min/{1.73_m2} — AB (ref >90)
GLUCOSE: 145 mg/dL — AB (ref 70–140)
POTASSIUM: 3.9 meq/L (ref 3.5–5.1)
SODIUM: 143 meq/L (ref 136–145)
Total Bilirubin: 0.8 mg/dL (ref 0.20–1.20)
Total Protein: 6.8 g/dL (ref 6.4–8.3)

## 2015-06-13 NOTE — Telephone Encounter (Addendum)
Patient aware of results.   ----- Message from [name], MD sent at 06/13/2015  3:29 PM EDT ----- Call - labs look ok, but sugar is a little high!!!  Be careful with added sugar on your vacation!!!  pete

## 2015-06-13 NOTE — Progress Notes (Signed)
Hematology and Oncology Follow Up Visit  Margaret English [id number] [birth year] 70 y.o. 06/13/2015   Principle Diagnosis:  1. 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 2. History of pulmonary emboli.  Current Therapy:    Aspirin 162 mg by mouth daily     Interim History:  [name] is back for follow-up. She is doing quite well. Last saw her back in December. As always, she has been traveling. 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  She actually is going to [place] this week for a conference. From [place], she will be going to [place] to see a friend and then she and her friend will be going over to [place] and then [place], [place] It is so nice that she can do this.  She's had no problems with infections. She's had no fever. She's had no nausea or vomiting.  She's had no bleeding.  She's had no leg swelling.  Is not been a positive weight loss or weight gain.  She's had a good Memorial Day weekend. She was with her family.  She has had a bilateral mastectomy so she does not need mammograms.  Currently, her performance status is ECOG 0  Medications:  Current outpatient prescriptions:  .  acyclovir (ZOVIRAX) 200 MG capsule, as needed. , Disp: , Rfl:  .  acyclovir cream (ZOVIRAX) 5 %, Apply topically as needed., Disp: , Rfl:  .  alprazolam (XANAX) 2 MG tablet, Take 1 tablet (2 mg total) by mouth at bedtime as needed for sleep., Disp: 30 tablet, Rfl: 2 .  azithromycin (ZITHROMAX Z-PAK) 250 MG tablet, Take as directed, Disp: 6 each, Rfl: 2 .  Calcium Citrate-Vitamin D (CALCIUM CITRATE + D PO), Take 2 tablets by mouth 2 (two) times daily. , Disp: , Rfl:  .  Cetirizine HCl (ZYRTEC ALLERGY PO), Take by mouth every morning., Disp: , Rfl:  .  cholecalciferol (VITAMIN D) 1000 UNITS tablet, Take 2,000 Units by mouth daily. 2 or 3 tabs daily, Disp: , Rfl:  .   Multiple Vitamins-Minerals (SENIOR MULTIVITAMIN PLUS PO), Take by mouth every morning., Disp: , Rfl:   Allergies:  Allergies  Allergen Reactions  . Sulfa Antibiotics     Other reaction(s): Other Drainage and red eyes only allergic to sulfa eye drops  . Sulfonamide Derivatives     Eye drops    Past Medical History, Surgical history, Social history, and Family History were reviewed and updated.  Review of Systems: As above  Physical Exam:  height is 5\' 5"  (1.651 m) and weight is 166 lb (75.297 kg). Her oral temperature is 98.1 F (36.7 C). Her blood pressure is 136/72 and her pulse is 79. Her respiration is 16.   Well-developed and well-nourished white female in no obvious distress. Head and neck exam shows no ocular or oral lesions. There are no palpable cervical or supraclavicular lymph nodes. There is fullness in the right supraclavicular region. Lungs are clear. Cardiac exam regular rate and rhythm with no murmurs, rubs or bruits. Breast exam shows bilateral mastectomies. She has implants. She has no nodularity. There is no erythema. There is no bilateral axillary adenopathy. Abdomen is soft. Shows good bowel sounds. There is no fluid wave. There is no palpable liver or spleen tip. Back exam shows no tenderness over the spine, ribs or hips. Extremities  shows no clubbing, cyanosis or edema. No lymphedema is noted in her arms. She has good range of motion of her extremities. She has good strength. Skin exam shows no rashes, ecchymoses or petechia. Neurological exam is nonfocal.  Lab Results  Component Value Date   WBC 4.6 06/13/2015   HGB 14.2 06/13/2015   HCT 41.6 06/13/2015   MCV 94 06/13/2015   PLT 221 06/13/2015     Chemistry      Component Value Date/Time   NA 142 12/13/2014 1054   NA 144 05/13/2014 0949   NA 142 05/12/2013 0943   K 4.2 12/13/2014 1054   K 4.1 05/13/2014 0949   K 3.4 05/12/2013 0943   CL 104 05/13/2014 0949   CL 102 05/12/2013 0943   CO2 30* 12/13/2014  1054   CO2 31 05/13/2014 0949   CO2 33 05/12/2013 0943   BUN 13.7 12/13/2014 1054   BUN 17 05/13/2014 0949   BUN 21 05/12/2013 0943   CREATININE 0.8 12/13/2014 1054   CREATININE 0.73 05/13/2014 0949   CREATININE 0.9 05/12/2013 0943      Component Value Date/Time   CALCIUM 9.9 12/13/2014 1054   CALCIUM 9.3 05/13/2014 0949   CALCIUM 9.7 05/12/2013 0943   ALKPHOS 81 12/13/2014 1054   ALKPHOS 73 05/13/2014 0949   ALKPHOS 68 05/12/2013 0943   AST 26 12/13/2014 1054   AST 17 05/13/2014 0949   AST 18 05/12/2013 0943   ALT 24 12/13/2014 1054   ALT 16 05/13/2014 0949   ALT 26 05/12/2013 0943   BILITOT 0.66 12/13/2014 1054   BILITOT 0.7 05/13/2014 0949   BILITOT 0.80 05/12/2013 0943         Impression and Plan: Margaret English is 70 year old white female. She has a history of recurrent ductal carcinoma the left breast. She underwent systemic chemotherapy for this. She has been out of therapy now for 9 years. 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                                        Again, I do not see any evidence of obvious recurrence. However, I still think that a CT scan of the neck would be helpful. She is at risk for recurrence and want to make sure that we are not "cavalier" in our evaluation.  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                                    She has a fantastic quality of life right now. She is very busy going on cruises.She will be going over to [place] for a vacation. She'll be leaving out of [place]. She really is looking for to this.  I will plan to see her back in another 6 months.   [name], MD 6/5/201710:47 AM

## 2015-06-14 LAB — VITAMIN D 25 HYDROXY (VIT D DEFICIENCY, FRACTURES): VIT D 25 HYDROXY: 66.3 ng/mL (ref 30.0–100.0)

## 2015-09-29 ENCOUNTER — Other Ambulatory Visit: Payer: Self-pay | Admitting: *Deleted

## 2015-09-29 DIAGNOSIS — J329 Chronic sinusitis, unspecified: Secondary | ICD-10-CM

## 2015-09-29 DIAGNOSIS — C50919 Malignant neoplasm of unspecified site of unspecified female breast: Secondary | ICD-10-CM

## 2015-09-29 MED ORDER — AZITHROMYCIN 250 MG PO TABS: ORAL_TABLET | ORAL | 2 refills | Status: DC

## 2015-10-28 DIAGNOSIS — H04223 Epiphora due to insufficient drainage, bilateral lacrimal glands: Secondary | ICD-10-CM | POA: Diagnosis not present

## 2015-11-21 DIAGNOSIS — Z23 Encounter for immunization: Secondary | ICD-10-CM | POA: Diagnosis not present

## 2015-11-21 DIAGNOSIS — Z0000 Encounter for general adult medical examination without abnormal findings: Secondary | ICD-10-CM | POA: Diagnosis not present

## 2015-11-21 DIAGNOSIS — Z1389 Encounter for screening for other disorder: Secondary | ICD-10-CM | POA: Diagnosis not present

## 2015-11-21 DIAGNOSIS — E559 Vitamin D deficiency, unspecified: Secondary | ICD-10-CM | POA: Diagnosis not present

## 2015-11-21 DIAGNOSIS — A6000 Herpesviral infection of urogenital system, unspecified: Secondary | ICD-10-CM | POA: Diagnosis not present

## 2015-11-21 DIAGNOSIS — M85851 Other specified disorders of bone density and structure, right thigh: Secondary | ICD-10-CM | POA: Diagnosis not present

## 2015-11-21 DIAGNOSIS — Z131 Encounter for screening for diabetes mellitus: Secondary | ICD-10-CM | POA: Diagnosis not present

## 2015-11-21 DIAGNOSIS — E782 Mixed hyperlipidemia: Secondary | ICD-10-CM | POA: Diagnosis not present

## 2015-11-21 DIAGNOSIS — C50912 Malignant neoplasm of unspecified site of left female breast: Secondary | ICD-10-CM | POA: Diagnosis not present

## 2015-11-23 ENCOUNTER — Encounter: Payer: Self-pay | Admitting: Internal Medicine

## 2015-11-23 DIAGNOSIS — E559 Vitamin D deficiency, unspecified: Secondary | ICD-10-CM | POA: Diagnosis not present

## 2015-11-23 DIAGNOSIS — M818 Other osteoporosis without current pathological fracture: Secondary | ICD-10-CM | POA: Diagnosis not present

## 2015-11-23 DIAGNOSIS — Z131 Encounter for screening for diabetes mellitus: Secondary | ICD-10-CM | POA: Diagnosis not present

## 2015-11-23 DIAGNOSIS — Z0000 Encounter for general adult medical examination without abnormal findings: Secondary | ICD-10-CM | POA: Diagnosis not present

## 2015-11-23 DIAGNOSIS — Z01419 Encounter for gynecological examination (general) (routine) without abnormal findings: Secondary | ICD-10-CM | POA: Diagnosis not present

## 2015-11-23 DIAGNOSIS — E782 Mixed hyperlipidemia: Secondary | ICD-10-CM | POA: Diagnosis not present

## 2015-11-23 DIAGNOSIS — C50912 Malignant neoplasm of unspecified site of left female breast: Secondary | ICD-10-CM | POA: Diagnosis not present

## 2015-11-23 DIAGNOSIS — Z1211 Encounter for screening for malignant neoplasm of colon: Secondary | ICD-10-CM | POA: Diagnosis not present

## 2015-11-23 DIAGNOSIS — Z1389 Encounter for screening for other disorder: Secondary | ICD-10-CM | POA: Diagnosis not present

## 2015-11-23 DIAGNOSIS — A6000 Herpesviral infection of urogenital system, unspecified: Secondary | ICD-10-CM | POA: Diagnosis not present

## 2015-12-01 ENCOUNTER — Encounter (HOSPITAL_COMMUNITY): Payer: Self-pay | Admitting: Emergency Medicine

## 2015-12-01 ENCOUNTER — Emergency Department (HOSPITAL_COMMUNITY)
Admission: EM | Admit: 2015-12-01 | Discharge: 2015-12-01 | Disposition: A | Discharge status: Home / Self Care | Payer: Medicare Other | Attending: Emergency Medicine | Admitting: Emergency Medicine

## 2015-12-01 DIAGNOSIS — Z859 Personal history of malignant neoplasm, unspecified: Secondary | ICD-10-CM | POA: Insufficient documentation

## 2015-12-01 DIAGNOSIS — Z79899 Other long term (current) drug therapy: Secondary | ICD-10-CM | POA: Insufficient documentation

## 2015-12-01 DIAGNOSIS — R040 Epistaxis: Secondary | ICD-10-CM | POA: Diagnosis not present

## 2015-12-01 LAB — BASIC METABOLIC PANEL
Anion gap: 9 (ref 5–15)
BUN: 13 mg/dL (ref 6–20)
CHLORIDE: 105 mmol/L (ref 101–111)
CO2: 25 mmol/L (ref 22–32)
CREATININE: 0.83 mg/dL (ref 0.44–1.00)
Calcium: 9.7 mg/dL (ref 8.9–10.3)
GFR calc Af Amer: >60 mL/min (ref >60)
GFR calc non Af Amer: >60 mL/min (ref >60)
Glucose, Bld: 129 mg/dL — ABNORMAL HIGH (ref 65–99)
Potassium: 3.5 mmol/L (ref 3.5–5.1)
SODIUM: 139 mmol/L (ref 135–145)

## 2015-12-01 LAB — ABO/RH: ABO/RH(D): A POS

## 2015-12-01 LAB — CBC
HCT: 38.9 % (ref 36.0–46.0)
Hemoglobin: 13.2 g/dL (ref 12.0–15.0)
MCH: 31 pg (ref 26.0–34.0)
MCHC: 33.9 g/dL (ref 30.0–36.0)
MCV: 91.3 fL (ref 78.0–100.0)
PLATELETS: 235 10*3/uL (ref 150–400)
RBC: 4.26 MIL/uL (ref 3.87–5.11)
RDW: 12.4 % (ref 11.5–15.5)
WBC: 4.7 10*3/uL (ref 4.0–10.5)

## 2015-12-01 LAB — TYPE AND SCREEN
ABO/RH(D): A POS
Antibody Screen: NEGATIVE

## 2015-12-01 LAB — PROTIME-INR
INR: 0.92
Prothrombin Time: 12.4 seconds (ref 11.4–15.2)

## 2015-12-01 MED ORDER — OXYMETAZOLINE HCL 0.05 % NA SOLN
1.0000 | Freq: Once | NASAL | Status: AC
  Administered 2015-12-01: 1 via NASAL
  Filled 2015-12-01: qty 15

## 2015-12-01 MED ORDER — MUPIROCIN 2 % EX OINT
TOPICAL_OINTMENT | Freq: Two times a day (BID) | CUTANEOUS | Status: DC
  Administered 2015-12-01: 13:00:00 via TOPICAL
  Filled 2015-12-01: qty 22

## 2015-12-01 MED ORDER — SILVER NITRATE-POT NITRATE 75-25 % EX MISC
1.0000 | Freq: Once | CUTANEOUS | Status: AC
  Administered 2015-12-01: 1 via TOPICAL
  Filled 2015-12-01: qty 1

## 2015-12-01 MED ORDER — MUPIROCIN CALCIUM 2 % EX CREA: 1.0000 "application " | TOPICAL_CREAM | Freq: Three times a day (TID) | CUTANEOUS | 0 refills | Status: DC

## 2015-12-01 MED ORDER — SODIUM CHLORIDE 0.9 % IV BOLUS (SEPSIS)
1000.0000 mL | Freq: Once | INTRAVENOUS | Status: AC
  Administered 2015-12-01: 1000 mL via INTRAVENOUS

## 2015-12-01 MED ORDER — OXYMETAZOLINE HCL 0.05 % NA SOLN: 1.0000 | Freq: Two times a day (BID) | NASAL | 0 refills | Status: DC | PRN

## 2015-12-01 MED ORDER — SALINE SPRAY 0.65 % NA SOLN: 1.0000 | Freq: Four times a day (QID) | NASAL | 0 refills | Status: DC

## 2015-12-01 NOTE — ED Notes (Signed)
ED Provider at bedside. 

## 2015-12-01 NOTE — ED Triage Notes (Signed)
Pt coughed today and nosebleed began about 30 minutes PTA of [facility].

## 2015-12-01 NOTE — Discharge Instructions (Signed)
If your nose starts to bleed again, do the following:  1.) Blow your nose lightly to remove any clots 2.) Spray 2 sprays of Afrin/oxymetazoline to each nostril 3.) Apply direct pressure, as shown in the ED, for 10 minutes 4.) Repeat as needed  If symptoms do not resolve after 20 minutes, return to the ED.

## 2015-12-01 NOTE — ED Provider Notes (Signed)
[facility] DEPT Provider Note   CSN: [id number] Arrival date & time: 12/01/15  1110     History   Chief Complaint Chief Complaint  Patient presents with  . Epistaxis    HPI Margaret English is a 70 y.o. female.  HPI 70 year old female who presents with epistaxis. The patient states that over the last several days, she has had mild nasal congestion, which is not atypical for her. She has been using Flonase. Earlier today, she coughed loudly and began bleeding a significant amount of blood from her left nose. She states it initially started coming out of her nares but then ran down the back of her throat. She states that it has lasted approximately 30 minutes. She has tried applying pressure without success. She is not on any blood thinners but does take an aspirin daily. She began to feel mildly lightheaded just prior to arrival. Denies any associated pain. Denies any vision changes. Denies any chest pain, shortness of breath. She has no history of anemia.  Past Medical History:  Diagnosis Date  . Cancer ([place])     There are no active problems to display for this patient.   History reviewed. No pertinent surgical history.  OB History    No data available       Home Medications    Prior to Admission medications   Medication Sig Start Date End Date Taking? Authorizing Provider  acyclovir (ZOVIRAX) 200 MG capsule as needed.  11/13/12   Historical Provider, MD  acyclovir cream (ZOVIRAX) 5 % Apply topically as needed.    Historical Provider, MD  alprazolam Duanne Moron) 2 MG tablet Take 1 tablet (2 mg total) by mouth at bedtime as needed for sleep. 11/13/12   [name], MD  azithromycin (ZITHROMAX Z-PAK) 250 MG tablet Take as directed 09/29/15   [name], MD  Calcium Citrate-Vitamin D (CALCIUM CITRATE + D PO) Take 2 tablets by mouth 2 (two) times daily.     Historical Provider, MD  Cetirizine HCl (ZYRTEC ALLERGY PO) Take by mouth every morning.    Historical Provider, MD    cholecalciferol (VITAMIN D) 1000 UNITS tablet Take 2,000 Units by mouth daily. 2 or 3 tabs daily    Historical Provider, MD  Multiple Vitamins-Minerals (SENIOR MULTIVITAMIN PLUS PO) Take by mouth every morning.    Historical Provider, MD  mupirocin cream (BACTROBAN) 2 % Apply 1 application topically 3 (three) times daily. 12/01/15   Duffy Bruce, MD  oxymetazoline (AFRIN EXTRA MOISTURIZING) 0.05 % nasal spray Place 1 spray into both nostrils 2 (two) times daily as needed (Nosebleed). 12/01/15   Duffy Bruce, MD  sodium chloride (OCEAN) 0.65 % SOLN nasal spray Place 1 spray into both nostrils 4 (four) times daily. 12/01/15 12/06/15  Duffy Bruce, MD    Family History History reviewed. No pertinent family history.  Social History Social History  Substance Use Topics  . Smoking status: Never Smoker  . Smokeless tobacco: Never Used     Comment: NEVER USED TOBACCO  . Alcohol use Not on file     Allergies   Sulfa antibiotics and Sulfonamide derivatives   Review of Systems Review of Systems  Constitutional: Positive for fatigue. Negative for chills and fever.  HENT: Positive for nosebleeds. Negative for congestion, rhinorrhea and sore throat.   Eyes: Negative for visual disturbance.  Respiratory: Negative for cough, shortness of breath and wheezing.   Cardiovascular: Negative for chest pain and leg swelling.  Gastrointestinal: Negative for abdominal pain, diarrhea,  nausea and vomiting.  Genitourinary: Negative for dysuria, flank pain, vaginal bleeding and vaginal discharge.  Musculoskeletal: Negative for neck pain.  Skin: Negative for rash.  Allergic/Immunologic: Negative for immunocompromised state.  Neurological: Positive for light-headedness. Negative for syncope and headaches.  Hematological: Does not bruise/bleed easily.  All other systems reviewed and are negative.    Physical Exam Updated Vital Signs BP 116/56   Pulse 71   Resp 17   Ht 5\' 6"  (1.676 m)   Wt 163  lb (73.9 kg)   SpO2 95%   BMI 26.31 kg/m   Physical Exam  Constitutional: She is oriented to person, place, and time. She appears well-developed and well-nourished. No distress.  HENT:  Head: Normocephalic and atraumatic.  Mucosal pallor bilaterally. Left anterior Kiesselbach's with exposed, friable capillary bed with small, adhered clot. No posterior pharyngeal bleeding.  Eyes: Conjunctivae are normal.  Neck: Neck supple.  Cardiovascular: Normal rate, regular rhythm and normal heart sounds.  Exam reveals no friction rub.   No murmur heard. Pulmonary/Chest: Effort normal and breath sounds normal. No respiratory distress. She has no wheezes. She has no rales.  Abdominal: She exhibits no distension.  Musculoskeletal: She exhibits no edema.  Neurological: She is alert and oriented to person, place, and time. She exhibits normal muscle tone.  Skin: Skin is warm. Capillary refill takes less than 2 seconds.  Psychiatric: She has a normal mood and affect.  Nursing note and vitals reviewed.    ED Treatments / Results  Labs (all labs ordered are listed, but only abnormal results are displayed) Labs Reviewed  BASIC METABOLIC PANEL - Abnormal; Notable for the following:       Result Value   Glucose, Bld 129 (*)    All other components within normal limits  CBC  PROTIME-INR  TYPE AND SCREEN  ABO/RH    EKG  EKG Interpretation None       Radiology No results found.  Procedures .Epistaxis Management Date/Time: 12/01/2015 8:06 PM Performed by: Duffy Bruce Authorized by: Duffy Bruce   Consent:    Consent obtained:  Verbal   Consent given by:  Patient   Risks discussed:  Bleeding, infection, nasal injury and pain   Alternatives discussed:  Delayed treatment and alternative treatment Anesthesia (see MAR for exact dosages):    Anesthesia method:  Topical application   Topical anesthetic:  Lidocaine gel Procedure details:    Treatment site:  L anterior   Treatment  method:  Silver nitrate   Treatment complexity:  Limited   Treatment episode: initial   Post-procedure details:    Assessment:  Bleeding stopped   Patient tolerance of procedure:  Tolerated well, no immediate complications   (including critical care time)  Medications Ordered in ED Medications  oxymetazoline (AFRIN) 0.05 % nasal spray 1 spray (1 spray Each Nare Given 12/01/15 1139)  sodium chloride 0.9 % bolus 1,000 mL (0 mLs Intravenous Stopped 12/01/15 1218)  silver nitrate applicators applicator 1 Stick (1 Stick Topical Given by Other 12/01/15 1218)     Initial Impression / Assessment and Plan / ED Course  I have reviewed the triage vital signs and the nursing notes.  Pertinent labs & imaging results that were available during my care of the patient were reviewed by me and considered in my medical decision making (see chart for details).  Clinical Course     70 year old female who presents with left anterior epistaxis. On arrival, patient initially hypotensive, likely due to vagal reaction and this improved  with cessation of bleeding. CBC shows normal hemoglobin at 13.2, which is reassuring in setting of bleed for more than one hour. Her blood pressure is normal at this time. Epistaxis controlled with bilateral oxymetazoline, direct pressure. Following hemostasis, direct visualization showed mucosal inflammation at left anterior Kiesselbach's plexus with exposed capillary with small clot. The area was anesthetized and cauterized with silver nitrate. Patient monitored for 30 minutes after cauterization with no recurrence of bleed. Will discharge home with mupirocin ointment, saline nasal spray, advised humidifier at home, and outpatient follow-up.  Final Clinical Impressions(s) / ED Diagnoses   Final diagnoses:  Left-sided epistaxis    New Prescriptions Discharge Medication List as of 12/01/2015 12:54 PM    START taking these medications   Details  mupirocin cream (BACTROBAN)  2 % Apply 1 application topically 3 (three) times daily., Starting Thu 12/01/2015, Print    oxymetazoline (AFRIN EXTRA MOISTURIZING) 0.05 % nasal spray Place 1 spray into both nostrils 2 (two) times daily as needed (Nosebleed)., Starting Thu 12/01/2015, Print    sodium chloride (OCEAN) 0.65 % SOLN nasal spray Place 1 spray into both nostrils 4 (four) times daily., Starting Thu 12/01/2015, Until Tue 12/06/2015, Print         Duffy Bruce, MD 12/01/15 2007

## 2015-12-14 ENCOUNTER — Encounter: Payer: Self-pay | Admitting: Hematology & Oncology

## 2015-12-14 ENCOUNTER — Ambulatory Visit (HOSPITAL_BASED_OUTPATIENT_CLINIC_OR_DEPARTMENT_OTHER): Payer: Medicare Other | Admitting: Hematology & Oncology

## 2015-12-14 ENCOUNTER — Other Ambulatory Visit (HOSPITAL_BASED_OUTPATIENT_CLINIC_OR_DEPARTMENT_OTHER): Payer: Medicare Other

## 2015-12-14 VITALS — BP 143/69 | HR 74 | Temp 97.9°F | Resp 20 | Wt 164.0 lb

## 2015-12-14 DIAGNOSIS — M818 Other osteoporosis without current pathological fracture: Secondary | ICD-10-CM

## 2015-12-14 DIAGNOSIS — C50011 Malignant neoplasm of nipple and areola, right female breast: Secondary | ICD-10-CM | POA: Diagnosis not present

## 2015-12-14 DIAGNOSIS — T386X5A Adverse effect of antigonadotrophins, antiestrogens, antiandrogens, not elsewhere classified, initial encounter: Secondary | ICD-10-CM

## 2015-12-14 DIAGNOSIS — C50919 Malignant neoplasm of unspecified site of unspecified female breast: Secondary | ICD-10-CM

## 2015-12-14 DIAGNOSIS — Z86711 Personal history of pulmonary embolism: Secondary | ICD-10-CM | POA: Diagnosis not present

## 2015-12-14 DIAGNOSIS — Z853 Personal history of malignant neoplasm of breast: Secondary | ICD-10-CM

## 2015-12-14 LAB — CBC WITH DIFFERENTIAL (CANCER CENTER ONLY)
BASO#: 0 10*3/uL (ref 0.0–0.2)
BASO%: 0.8 % (ref 0.0–2.0)
EOS ABS: 0.2 10*3/uL (ref 0.0–0.5)
EOS%: 5.1 % (ref 0.0–7.0)
HCT: 39 % (ref 34.8–46.6)
HGB: 13.2 g/dL (ref 11.6–15.9)
LYMPH#: 1.5 10*3/uL (ref 0.9–3.3)
LYMPH%: 30.9 % (ref 14.0–48.0)
MCH: 32 pg (ref 26.0–34.0)
MCHC: 33.8 g/dL (ref 32.0–36.0)
MCV: 95 fL (ref 81–101)
MONO#: 0.5 10*3/uL (ref 0.1–0.9)
MONO%: 10 % (ref 0.0–13.0)
NEUT#: 2.5 10*3/uL (ref 1.5–6.5)
NEUT%: 53.2 % (ref 39.6–80.0)
PLATELETS: 259 10*3/uL (ref 145–400)
RBC: 4.12 10*6/uL (ref 3.70–5.32)
RDW: 12.6 % (ref 11.1–15.7)
WBC: 4.7 10*3/uL (ref 3.9–10.0)

## 2015-12-14 LAB — COMPREHENSIVE METABOLIC PANEL
ALT: 35 U/L (ref 0–55)
ANION GAP: 10 meq/L (ref 3–11)
AST: 32 U/L (ref 5–34)
Albumin: 3.7 g/dL (ref 3.5–5.0)
Alkaline Phosphatase: 75 U/L (ref 40–150)
BUN: 10.2 mg/dL (ref 7.0–26.0)
CHLORIDE: 106 meq/L (ref 98–109)
CO2: 27 meq/L (ref 22–29)
Calcium: 10.1 mg/dL (ref 8.4–10.4)
Creatinine: 0.8 mg/dL (ref 0.6–1.1)
EGFR: 80 mL/min/{1.73_m2} — AB (ref >90)
GLUCOSE: 115 mg/dL (ref 70–140)
POTASSIUM: 4.3 meq/L (ref 3.5–5.1)
SODIUM: 143 meq/L (ref 136–145)
Total Bilirubin: 0.82 mg/dL (ref 0.20–1.20)
Total Protein: 6.8 g/dL (ref 6.4–8.3)

## 2015-12-14 NOTE — Progress Notes (Signed)
Hematology and Oncology Follow Up Visit  Margaret English [id number] August 04, 1945 70 y.o. 12/14/2015   Principle Diagnosis:  1. 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 2. History of pulmonary emboli.  Current Therapy:    Aspirin 162 mg by mouth daily     Interim History:  Margaret English is back for follow-up. She is doing quite well. We last saw her back in June. Since that, she has been on a couple cruises. She actually went down to [place] and went on a cruise over to [place]. From there, she and her friend went to [place]. I think he also went to [place]. They also went to [place]. Her friend had a fall but thankfully did not break anything.   Margaret English unfortunately had a nosebleed on Thanksgiving day. She had a go to the emergency room to get this cauterized.   She's had no process with cough or shortness of breath. She's trying to lose a little weight. She is watching her diet closely.   She's had no rashes. There's been no leg swelling. She's had no cough. She's had no fever.   She has had a bilateral mastectomy so she does not need mammograms.  Currently, her performance status is ECOG 0  Medications:  Current Outpatient Prescriptions:  .  Calcium Citrate-Vitamin D (CALCIUM CITRATE + D PO), Take 2 tablets by mouth 2 (two) times daily. , Disp: , Rfl:  .  Cetirizine HCl (ZYRTEC ALLERGY PO), Take by mouth every morning., Disp: , Rfl:  .  Cholecalciferol (VITAMIN D) 2000 units CAPS, Take 2,000 Units by mouth 2 (two) times daily. 2 or 3 tabs daily, Disp: , Rfl:  .  Multiple Vitamins-Minerals (SENIOR MULTIVITAMIN PLUS PO), Take by mouth every morning., Disp: , Rfl:  .  mupirocin cream (BACTROBAN) 2 %, Apply 1 application topically 3 (three) times daily., Disp: 15 g, Rfl: 0 .  oxymetazoline (AFRIN EXTRA MOISTURIZING) 0.05 % nasal spray, Place 1 spray into both nostrils 2 (two) times daily as needed (Nosebleed)., Disp: 30 mL, Rfl: 0 .   acyclovir (ZOVIRAX) 200 MG capsule, as needed. , Disp: , Rfl:  .  acyclovir cream (ZOVIRAX) 5 %, Apply topically as needed., Disp: , Rfl:  .  alprazolam (XANAX) 2 MG tablet, Take 1 tablet (2 mg total) by mouth at bedtime as needed for sleep. (Patient not taking: Reported on 12/14/2015), Disp: 30 tablet, Rfl: 2 .  azithromycin (ZITHROMAX Z-PAK) 250 MG tablet, Take as directed (Patient not taking: Reported on 12/14/2015), Disp: 6 each, Rfl: 2 .  sodium chloride (OCEAN) 0.65 % SOLN nasal spray, Place 1 spray into both nostrils 4 (four) times daily., Disp: 15 mL, Rfl: 0  Allergies:  Allergies  Allergen Reactions  . Sulfa Antibiotics     Other reaction(s): Other Drainage and red eyes only allergic to sulfa eye drops  . Sulfonamide Derivatives     Eye drops    Past Medical History, Surgical history, Social history, and Family History were reviewed and updated.  Review of Systems: As above  Physical Exam:  weight is 164 lb (74.4 kg). Her oral temperature is 97.9 F (36.6 C). Her blood pressure is 143/69 (abnormal) and her pulse is 74. Her respiration is 20.   Well-developed and well-nourished white female in no obvious distress. Head and neck exam shows no ocular or oral lesions. There are no palpable cervical or supraclavicular lymph nodes. There is fullness in  the right supraclavicular region. Lungs are clear. Cardiac exam regular rate and rhythm with no murmurs, rubs or bruits. Breast exam shows bilateral mastectomies. She has implants. She has no nodularity. There is no erythema. There is no bilateral axillary adenopathy. Abdomen is soft. Shows good bowel sounds. There is no fluid wave. There is no palpable liver or spleen tip. Back exam shows no tenderness over the spine, ribs or hips. Extremities shows no clubbing, cyanosis or edema. No lymphedema is noted in her arms. She has good range of motion of her extremities. She has good strength. Skin exam shows no rashes, ecchymoses or petechia.  Neurological exam is nonfocal.  Lab Results  Component Value Date   WBC 4.7 12/14/2015   HGB 13.2 12/14/2015   HCT 39.0 12/14/2015   MCV 95 12/14/2015   PLT 259 12/14/2015     Chemistry      Component Value Date/Time   NA 139 12/01/2015 1140   NA 143 06/13/2015 0947   K 3.5 12/01/2015 1140   K 3.9 06/13/2015 0947   CL 105 12/01/2015 1140   CL 102 05/12/2013 0943   CO2 25 12/01/2015 1140   CO2 30 (H) 06/13/2015 0947   BUN 13 12/01/2015 1140   BUN 14.7 06/13/2015 0947   CREATININE 0.83 12/01/2015 1140   CREATININE 0.8 06/13/2015 0947      Component Value Date/Time   CALCIUM 9.7 12/01/2015 1140   CALCIUM 9.7 06/13/2015 0947   ALKPHOS 63 06/13/2015 0947   AST 22 06/13/2015 0947   ALT 24 06/13/2015 0947   BILITOT 0.80 06/13/2015 0947         Impression and Plan: Margaret English is 70 year old white female. She has a history of recurrent ductal carcinoma the left breast. She underwent systemic chemotherapy for this. She has been out of therapy now for Was 4 over 9 years. 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                                         I have said that she had this nosebleed on Thanksgiving. She certainly has gotten over this. It looks like there was a exposed capillary.  As always, I will enjoy her stories of her adventures with cruising.  I will plan to see her back in another 6 months.   Margaret Napoleon, MD 12/6/201710:24 Margaret English

## 2015-12-15 ENCOUNTER — Other Ambulatory Visit: Payer: Self-pay | Admitting: Physician Assistant

## 2015-12-15 ENCOUNTER — Telehealth: Payer: Self-pay | Admitting: *Deleted

## 2015-12-15 ENCOUNTER — Ambulatory Visit
Admission: RE | Admit: 2015-12-15 | Discharge: 2015-12-15 | Disposition: A | Discharge status: Home / Self Care | Payer: Medicare Other | Source: Ambulatory Visit | Attending: Physician Assistant | Admitting: Physician Assistant

## 2015-12-15 DIAGNOSIS — R109 Unspecified abdominal pain: Secondary | ICD-10-CM | POA: Diagnosis not present

## 2015-12-15 DIAGNOSIS — R319 Hematuria, unspecified: Secondary | ICD-10-CM | POA: Diagnosis not present

## 2015-12-15 DIAGNOSIS — N132 Hydronephrosis with renal and ureteral calculous obstruction: Secondary | ICD-10-CM | POA: Diagnosis not present

## 2015-12-15 DIAGNOSIS — R300 Dysuria: Secondary | ICD-10-CM | POA: Diagnosis not present

## 2015-12-15 DIAGNOSIS — M545 Low back pain: Secondary | ICD-10-CM | POA: Diagnosis not present

## 2015-12-15 DIAGNOSIS — N134 Hydroureter: Secondary | ICD-10-CM | POA: Diagnosis not present

## 2015-12-15 LAB — VITAMIN D 25 HYDROXY (VIT D DEFICIENCY, FRACTURES): VIT D 25 HYDROXY: 69.5 ng/mL (ref 30.0–100.0)

## 2015-12-15 NOTE — Telephone Encounter (Signed)
-----   Message from [name], MD sent at 12/15/2015  6:59 AM EST ----- Call - vit D level is excellent!!!  pete

## 2015-12-16 DIAGNOSIS — N201 Calculus of ureter: Secondary | ICD-10-CM | POA: Diagnosis not present

## 2015-12-19 ENCOUNTER — Other Ambulatory Visit: Payer: Self-pay | Admitting: Urology

## 2015-12-19 ENCOUNTER — Telehealth: Payer: Self-pay | Admitting: *Deleted

## 2015-12-19 NOTE — Telephone Encounter (Signed)
Patient will be having a lithotripsy scheduled for 12/28/20117. She is supposed to come off her Aspirin for 5 days prior to the procedure. She wants to make sure this is okay.  Spoke to Dr Marin Olp and he is okay with patient going off of Aspirin for 5 days prior to procedure. Patient will notify the urology office.

## 2015-12-28 ENCOUNTER — Encounter (HOSPITAL_COMMUNITY): Payer: Self-pay | Admitting: *Deleted

## 2016-01-05 ENCOUNTER — Ambulatory Visit (HOSPITAL_COMMUNITY): Payer: Medicare Other

## 2016-01-05 ENCOUNTER — Encounter (HOSPITAL_COMMUNITY): Payer: Self-pay | Admitting: *Deleted

## 2016-01-05 ENCOUNTER — Encounter (HOSPITAL_COMMUNITY)
Admission: RE | Disposition: A | Discharge status: Home / Self Care | Payer: Self-pay | Source: Ambulatory Visit | Attending: Urology

## 2016-01-05 ENCOUNTER — Ambulatory Visit (HOSPITAL_COMMUNITY)
Admission: RE | Admit: 2016-01-05 | Discharge: 2016-01-05 | Disposition: A | Discharge status: Home / Self Care | Payer: Medicare Other | Source: Ambulatory Visit | Attending: Urology | Admitting: Urology

## 2016-01-05 DIAGNOSIS — Z882 Allergy status to sulfonamides status: Secondary | ICD-10-CM | POA: Diagnosis not present

## 2016-01-05 DIAGNOSIS — N200 Calculus of kidney: Secondary | ICD-10-CM | POA: Insufficient documentation

## 2016-01-05 DIAGNOSIS — Z91040 Latex allergy status: Secondary | ICD-10-CM | POA: Diagnosis not present

## 2016-01-05 HISTORY — DX: Personal history of urinary calculi: Z87.442

## 2016-01-05 HISTORY — DX: Other specified disorders of bone density and structure, unspecified site: M85.80

## 2016-01-05 SURGERY — LITHOTRIPSY, ESWL
Anesthesia: LOCAL | Laterality: Left

## 2016-01-05 MED ORDER — CIPROFLOXACIN HCL 500 MG PO TABS
500.0000 mg | ORAL_TABLET | ORAL | Status: AC
  Administered 2016-01-05: 500 mg via ORAL
  Filled 2016-01-05: qty 1

## 2016-01-05 MED ORDER — DIPHENHYDRAMINE HCL 25 MG PO CAPS
25.0000 mg | ORAL_CAPSULE | ORAL | Status: AC
  Administered 2016-01-05: 25 mg via ORAL
  Filled 2016-01-05: qty 1

## 2016-01-05 MED ORDER — SODIUM CHLORIDE 0.9 % IV SOLN
INTRAVENOUS | Status: DC
  Administered 2016-01-05: 11:00:00 via INTRAVENOUS

## 2016-01-05 MED ORDER — DIAZEPAM 5 MG PO TABS
10.0000 mg | ORAL_TABLET | ORAL | Status: AC
  Administered 2016-01-05: 10 mg via ORAL
  Filled 2016-01-05: qty 2

## 2016-01-05 NOTE — Progress Notes (Signed)
Pt states she" thinks the flomax causes constipation" . She took 2 doses 3 weeks ago and had 4 days of constipation.I placed a call to Dr Glade Lloyd to report this and he wants patient to discontinue flomax. Patient and husband were informed of this and verbalized understanding

## 2016-01-05 NOTE — Discharge Instructions (Signed)
See [facility].     Moderate Conscious Sedation, Adult, Care After These instructions provide you with information about caring for yourself after your procedure. Your health care provider may also give you more specific instructions. Your treatment has been planned according to current medical practices, but problems sometimes occur. Call your health care provider if you have any problems or questions after your procedure. What can I expect after the procedure? After your procedure, it is common:  To feel sleepy for several hours.  To feel clumsy and have poor balance for several hours.  To have poor judgment for several hours.  To vomit if you eat too soon. Follow these instructions at home: For at least 24 hours after the procedure:   Do not:  Participate in activities where you could fall or become injured.  Drive.  Use heavy machinery.  Drink alcohol.  Take sleeping pills or medicines that cause drowsiness.  Make important decisions or sign legal documents.  Take care of children on your own.  Rest. Eating and drinking  Follow the diet recommended by your health care provider.  If you vomit:  Drink water, juice, or soup when you can drink without vomiting.  Make sure you have little or no nausea before eating solid foods. General instructions  Have a responsible adult stay with you until you are awake and alert.  Take over-the-counter and prescription medicines only as told by your health care provider.  If you smoke, do not smoke without supervision.  Keep all follow-up visits as told by your health care provider. This is important. Contact a health care provider if:  You keep feeling nauseous or you keep vomiting.  You feel light-headed.  You develop a rash.  You have a fever. Get help right away if:  You have trouble breathing. This information is not intended to replace advice given to you by your health care  provider. Make sure you discuss any questions you have with your health care provider. Document Released: 10/15/2012 Document Revised: 05/30/2015 Document Reviewed: 04/16/2015 Elsevier Interactive Patient Education  2017 [employer].

## 2016-01-19 ENCOUNTER — Ambulatory Visit (AMBULATORY_SURGERY_CENTER): Payer: Self-pay | Admitting: *Deleted

## 2016-01-19 VITALS — Ht 66.0 in | Wt 163.2 lb

## 2016-01-19 DIAGNOSIS — N201 Calculus of ureter: Secondary | ICD-10-CM | POA: Diagnosis not present

## 2016-01-19 DIAGNOSIS — Z1211 Encounter for screening for malignant neoplasm of colon: Secondary | ICD-10-CM

## 2016-01-19 MED ORDER — NA SULFATE-K SULFATE-MG SULF 17.5-3.13-1.6 GM/177ML PO SOLN: ORAL | 0 refills | Status: DC

## 2016-01-19 NOTE — Progress Notes (Signed)
Pt denies allergies to eggs or soy products. Denies difficulty with sedation or anesthesia. Denies any diet or weight loss medications. Denies use of supplemental oxygen.  Emmi instructions given for procedure.  

## 2016-01-20 DIAGNOSIS — N201 Calculus of ureter: Secondary | ICD-10-CM | POA: Diagnosis not present

## 2016-01-27 DIAGNOSIS — N201 Calculus of ureter: Secondary | ICD-10-CM | POA: Diagnosis not present

## 2016-01-30 DIAGNOSIS — Z23 Encounter for immunization: Secondary | ICD-10-CM | POA: Diagnosis not present

## 2016-02-09 ENCOUNTER — Encounter: Payer: Medicare Other | Admitting: Internal Medicine

## 2016-02-27 ENCOUNTER — Encounter: Payer: Medicare Other | Admitting: Internal Medicine

## 2016-03-16 ENCOUNTER — Encounter: Payer: Self-pay | Admitting: Internal Medicine

## 2016-03-16 ENCOUNTER — Ambulatory Visit (AMBULATORY_SURGERY_CENTER): Payer: Medicare Other | Admitting: Internal Medicine

## 2016-03-16 VITALS — BP 114/60 | HR 72 | Temp 97.5°F | Resp 13 | Ht 66.0 in | Wt 163.0 lb

## 2016-03-16 DIAGNOSIS — Z1211 Encounter for screening for malignant neoplasm of colon: Secondary | ICD-10-CM | POA: Diagnosis not present

## 2016-03-16 DIAGNOSIS — D122 Benign neoplasm of ascending colon: Secondary | ICD-10-CM | POA: Diagnosis not present

## 2016-03-16 DIAGNOSIS — N189 Chronic kidney disease, unspecified: Secondary | ICD-10-CM | POA: Diagnosis not present

## 2016-03-16 DIAGNOSIS — Z1212 Encounter for screening for malignant neoplasm of rectum: Secondary | ICD-10-CM

## 2016-03-16 HISTORY — PX: COLONOSCOPY: SHX174

## 2016-03-16 MED ORDER — SODIUM CHLORIDE 0.9 % IV SOLN: 500.0000 mL | INTRAVENOUS | Status: DC

## 2016-03-16 NOTE — Op Note (Signed)
[facility] Patient Name: Margaret English Procedure Date: 03/16/2016 9:12 AM MRN: [id number] Endoscopist: Docia Chuck. Henrene Pastor , MD Age: 71 Referring MD:  Date of Birth: 1945/09/10 Gender: Female Account #: [id number] Procedure:                Colonoscopythe with cold snare polypectomy X2 Indications:              Screening for colorectal malignant neoplasm. Index                            examination 2007 was negative Medicines:                Monitored Anesthesia Care Procedure:                Pre-Anesthesia Assessment:                           - Prior to the procedure, a History and Physical                            was performed, and patient medications and                            allergies were reviewed. The patient's tolerance of                            previous anesthesia was also reviewed. The risks                            and benefits of the procedure and the sedation                            options and risks were discussed with the patient.                            All questions were answered, and informed consent                            was obtained. Prior Anticoagulants: The patient has                            taken no previous anticoagulant or antiplatelet                            agents. ASA Grade Assessment: II - A patient with                            mild systemic disease. After reviewing the risks                            and benefits, the patient was deemed in                            satisfactory condition to undergo the procedure.  After obtaining informed consent, the colonoscope                            was passed under direct vision. Throughout the                            procedure, the patient's blood pressure, pulse, and                            oxygen saturations were monitored continuously. The                            Colonoscope was introduced through the anus and   advanced to the the cecum, identified by                            appendiceal orifice and ileocecal valve. The                            ileocecal valve, appendiceal orifice, and rectum                            were photographed. The quality of the bowel                            preparation was excellent. The colonoscopy was                            performed without difficulty. The patient tolerated                            the procedure well. The bowel preparation used was                            SUPREP. Scope In: 9:18:53 AM Scope Out: 9:34:18 AM Scope Withdrawal Time: 0 hours 11 minutes 42 seconds  Total Procedure Duration: 0 hours 15 minutes 25 seconds  Findings:                 Two polyps were found in the ascending colon. The                            polyps were 2 to 3 mm in size. These polyps were                            removed with a cold snare. Resection and retrieval                            were complete.                           A few diverticula were found in the sigmoid colon.                           Internal hemorrhoids were found  during retroflexion.                           The exam was otherwise without abnormality on                            direct and retroflexion views. Complications:            No immediate complications. Estimated blood loss:                            None. Estimated Blood Loss:     Estimated blood loss: none. Impression:               - Two 2 to 3 mm polyps in the ascending colon,                            removed with a cold snare. Resected and retrieved.                           - Diverticulosis in the sigmoid colon.                           - Internal hemorrhoids.                           - The examination was otherwise normal on direct                            and retroflexion views. Recommendation:           - Repeat colonoscopy in 5years for surveillance, if                            polyps adenomatous.  Otherwise no surveillance                            recommended.                           - Patient has a contact number available for                            emergencies. The signs and symptoms of potential                            delayed complications were discussed with the                            patient. Return to normal activities tomorrow.                            Written discharge instructions were provided to the                            patient.                           -  Resume previous diet.                           - Continue present medications.                           - Await pathology results. Docia Chuck. Henrene Pastor, MD 03/16/2016 9:40:01 AM This report has been signed electronically.

## 2016-03-16 NOTE — Progress Notes (Signed)
Called to room to assist during endoscopic procedure.  Patient ID and intended procedure confirmed with present staff. Received instructions for my participation in the procedure from the performing physician.  

## 2016-03-16 NOTE — Patient Instructions (Signed)
YOU HAD AN ENDOSCOPIC PROCEDURE TODAY AT [facility]:   Refer to the procedure report that was given to you for any specific questions about what was found during the examination.  If the procedure report does not answer your questions, please call your gastroenterologist to clarify.  If you requested that your care partner not be given the details of your procedure findings, then the procedure report has been included in a sealed envelope for you to review at your convenience later.  YOU SHOULD EXPECT: Some feelings of bloating in the abdomen. Passage of more gas than usual.  Walking can help get rid of the air that was put into your GI tract during the procedure and reduce the bloating. If you had a lower endoscopy (such as a colonoscopy or flexible sigmoidoscopy) you may notice spotting of blood in your stool or on the toilet paper. If you underwent a bowel prep for your procedure, you may not have a normal bowel movement for a few days.  Please Note:  You might notice some irritation and congestion in your nose or some drainage.  This is from the oxygen used during your procedure.  There is no need for concern and it should clear up in a day or so.  SYMPTOMS TO REPORT IMMEDIATELY:   Following lower endoscopy (colonoscopy or flexible sigmoidoscopy):  Excessive amounts of blood in the stool  Significant tenderness or worsening of abdominal pains  Swelling of the abdomen that is new, acute  Fever of 100F or higher   For urgent or emergent issues, a gastroenterologist can be reached at any hour by calling [phone].   DIET:  We do recommend a small meal at first, but then you may proceed to your regular diet.  Drink plenty of fluids but you should avoid alcoholic beverages for 24 hours.  ACTIVITY:  You should plan to take it easy for the rest of today and you should NOT DRIVE or use heavy machinery until tomorrow (because of the sedation medicines used during the test).     FOLLOW UP: Our staff will call the number listed on your records the next business day following your procedure to check on you and address any questions or concerns that you may have regarding the information given to you following your procedure. If we do not reach you, we will leave a message.  However, if you are feeling well and you are not experiencing any problems, there is no need to return our call.  We will assume that you have returned to your regular daily activities without incident.  If any biopsies were taken you will be contacted by phone or by letter within the next 1-3 weeks.  Please call us at [phone] if you have not heard about the biopsies in 3 weeks.   Await for biopsy results to determined next repeat Colonoscopy screening in probably 5 years Polyps (handout given) Hemorrhoids (handout given) Diverticulosis (handout given) High Fiber Diet (handout given)   SIGNATURES/CONFIDENTIALITY: You and/or your care partner have signed paperwork which will be entered into your electronic medical record.  These signatures attest to the fact that that the information above on your After Visit Summary has been reviewed and is understood.  Full responsibility of the confidentiality of this discharge information lies with you and/or your care-partner.

## 2016-03-16 NOTE — Progress Notes (Signed)
To PACU, vss patent aw report to rn 

## 2016-03-19 ENCOUNTER — Telehealth: Payer: Self-pay

## 2016-03-19 NOTE — Telephone Encounter (Signed)
  Follow up Call-  Call back number 03/16/2016  Post procedure Call Back phone  # [phone]  Permission to leave phone message Yes  Some recent data might be hidden     Patient questions:  Do you have a fever, pain , or abdominal swelling? No. Pain Score  0 *  Have you tolerated food without any problems? Yes.    Have you been able to return to your normal activities? Yes.    Do you have any questions about your discharge instructions: Diet   No. Medications  No. Follow up visit  No.  Do you have questions or concerns about your Care? No.  Actions: * If pain score is 4 or above: No action needed, pain <4.

## 2016-03-20 ENCOUNTER — Encounter: Payer: Self-pay | Admitting: Internal Medicine

## 2016-03-26 DIAGNOSIS — L918 Other hypertrophic disorders of the skin: Secondary | ICD-10-CM | POA: Diagnosis not present

## 2016-03-26 DIAGNOSIS — L814 Other melanin hyperpigmentation: Secondary | ICD-10-CM | POA: Diagnosis not present

## 2016-03-26 DIAGNOSIS — L570 Actinic keratosis: Secondary | ICD-10-CM | POA: Diagnosis not present

## 2016-03-26 DIAGNOSIS — Z86018 Personal history of other benign neoplasm: Secondary | ICD-10-CM | POA: Diagnosis not present

## 2016-03-26 DIAGNOSIS — D485 Neoplasm of uncertain behavior of skin: Secondary | ICD-10-CM | POA: Diagnosis not present

## 2016-03-26 DIAGNOSIS — D1801 Hemangioma of skin and subcutaneous tissue: Secondary | ICD-10-CM | POA: Diagnosis not present

## 2016-03-26 DIAGNOSIS — L821 Other seborrheic keratosis: Secondary | ICD-10-CM | POA: Diagnosis not present

## 2016-03-26 DIAGNOSIS — D225 Melanocytic nevi of trunk: Secondary | ICD-10-CM | POA: Diagnosis not present

## 2016-05-17 DIAGNOSIS — M79671 Pain in right foot: Secondary | ICD-10-CM | POA: Diagnosis not present

## 2016-05-17 DIAGNOSIS — M818 Other osteoporosis without current pathological fracture: Secondary | ICD-10-CM | POA: Diagnosis not present

## 2016-06-13 ENCOUNTER — Other Ambulatory Visit (HOSPITAL_BASED_OUTPATIENT_CLINIC_OR_DEPARTMENT_OTHER): Payer: Medicare Other

## 2016-06-13 ENCOUNTER — Ambulatory Visit (HOSPITAL_BASED_OUTPATIENT_CLINIC_OR_DEPARTMENT_OTHER): Payer: Medicare Other | Admitting: Hematology & Oncology

## 2016-06-13 VITALS — BP 136/71 | HR 68 | Temp 97.9°F | Resp 16 | Wt 160.0 lb

## 2016-06-13 DIAGNOSIS — Z853 Personal history of malignant neoplasm of breast: Secondary | ICD-10-CM | POA: Diagnosis not present

## 2016-06-13 DIAGNOSIS — Z86711 Personal history of pulmonary embolism: Secondary | ICD-10-CM | POA: Diagnosis not present

## 2016-06-13 DIAGNOSIS — C50011 Malignant neoplasm of nipple and areola, right female breast: Secondary | ICD-10-CM

## 2016-06-13 DIAGNOSIS — M818 Other osteoporosis without current pathological fracture: Secondary | ICD-10-CM | POA: Diagnosis not present

## 2016-06-13 DIAGNOSIS — C50919 Malignant neoplasm of unspecified site of unspecified female breast: Secondary | ICD-10-CM | POA: Diagnosis not present

## 2016-06-13 DIAGNOSIS — T386X5A Adverse effect of antigonadotrophins, antiestrogens, antiandrogens, not elsewhere classified, initial encounter: Secondary | ICD-10-CM | POA: Diagnosis not present

## 2016-06-13 LAB — CBC WITH DIFFERENTIAL (CANCER CENTER ONLY)
BASO#: 0 10*3/uL (ref 0.0–0.2)
BASO%: 0.7 % (ref 0.0–2.0)
EOS ABS: 0.2 10*3/uL (ref 0.0–0.5)
EOS%: 4.1 % (ref 0.0–7.0)
HEMATOCRIT: 42.4 % (ref 34.8–46.6)
HEMOGLOBIN: 14.4 g/dL (ref 11.6–15.9)
LYMPH#: 1.5 10*3/uL (ref 0.9–3.3)
LYMPH%: 33.2 % (ref 14.0–48.0)
MCH: 32.1 pg (ref 26.0–34.0)
MCHC: 34 g/dL (ref 32.0–36.0)
MCV: 95 fL (ref 81–101)
MONO#: 0.4 10*3/uL (ref 0.1–0.9)
MONO%: 8 % (ref 0.0–13.0)
NEUT%: 54 % (ref 39.6–80.0)
NEUTROS ABS: 2.5 10*3/uL (ref 1.5–6.5)
Platelets: 230 10*3/uL (ref 145–400)
RBC: 4.48 10*6/uL (ref 3.70–5.32)
RDW: 12.4 % (ref 11.1–15.7)
WBC: 4.6 10*3/uL (ref 3.9–10.0)

## 2016-06-13 LAB — CMP (CANCER CENTER ONLY)
ALBUMIN: 3.5 g/dL (ref 3.3–5.5)
ALT(SGPT): 41 U/L (ref 10–47)
AST: 32 U/L (ref 11–38)
Alkaline Phosphatase: 69 U/L (ref 26–84)
BUN, Bld: 10 mg/dL (ref 7–22)
CHLORIDE: 106 meq/L (ref 98–108)
CO2: 31 meq/L (ref 18–33)
Calcium: 9.5 mg/dL (ref 8.0–10.3)
Creat: 0.8 mg/dl (ref 0.6–1.2)
Glucose, Bld: 109 mg/dL (ref 73–118)
POTASSIUM: 4.3 meq/L (ref 3.3–4.7)
Sodium: 143 mEq/L (ref 128–145)
Total Bilirubin: 1 mg/dl (ref 0.20–1.60)
Total Protein: 6.6 g/dL (ref 6.4–8.1)

## 2016-06-13 LAB — LACTATE DEHYDROGENASE: LDH: 160 U/L (ref 125–245)

## 2016-06-13 NOTE — Progress Notes (Signed)
Hematology and Oncology Follow Up Visit  Margaret English [id number] June 23, 1945 71 y.o. 06/13/2016   Principle Diagnosis:  1. 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 2. History of pulmonary emboli.  Current Therapy:    Aspirin 162 mg by mouth daily     Interim History:  [name] is back for follow-up. She is doing quite well. We last saw her back in June. Since that, she has been on a couple cruises. She actually went down to [place] and went on a cruise over to [place]. From there, she and her friend went to [place]. I think he also went to [place]. They also went to [place]. Her friend had a fall but thankfully did not break anything.   Margaret English unfortunately had a nosebleed on Thanksgiving day. She had a go to the emergency room to get this cauterized.   She's had no process with cough or shortness of breath. She's trying to lose a little weight. She is watching her diet closely.   She's had no rashes. There's been no leg swelling. She's had no cough. She's had no fever.   She has had a bilateral mastectomy so she does not need mammograms.  Currently, her performance status is ECOG 0  Medications:  Current Outpatient Prescriptions:  .  acyclovir (ZOVIRAX) 200 MG capsule, as needed. , Disp: , Rfl:  .  acyclovir cream (ZOVIRAX) 5 %, Apply topically as needed., Disp: , Rfl:  .  alprazolam (XANAX) 2 MG tablet, Take 1 tablet (2 mg total) by mouth at bedtime as needed for sleep., Disp: 30 tablet, Rfl: 2 .  aspirin 81 MG EC tablet, Take 162 mg by mouth daily. Swallow whole., Disp: , Rfl:  .  Calcium Citrate-Vitamin D (CALCIUM CITRATE + D PO), Take 2 tablets by mouth 2 (two) times daily. , Disp: , Rfl:  .  Cetirizine HCl (ZYRTEC ALLERGY PO), Take by mouth every morning., Disp: , Rfl:  .  Cholecalciferol (VITAMIN D) 2000 units CAPS, Take 2,000 Units by mouth 2 (two) times daily. 2 or 3 tabs daily, Disp: , Rfl:  .  fluticasone  (FLONASE) 50 MCG/ACT nasal spray, Place into both nostrils as needed for allergies or rhinitis., Disp: , Rfl:  .  Multiple Vitamins-Minerals (SENIOR MULTIVITAMIN PLUS PO), Take by mouth every morning., Disp: , Rfl:  .  mupirocin cream (BACTROBAN) 2 %, Apply 1 application topically 3 (three) times daily. (Patient not taking: Reported on 01/19/2016), Disp: 15 g, Rfl: 0 .  oxyCODONE-acetaminophen (PERCOCET/ROXICET) 5-325 MG tablet, Take 1 tablet by mouth every 4 (four) hours as needed for severe pain., Disp: , Rfl:  .  oxymetazoline (AFRIN EXTRA MOISTURIZING) 0.05 % nasal spray, Place 1 spray into both nostrils 2 (two) times daily as needed (Nosebleed). (Patient not taking: Reported on 01/19/2016), Disp: 30 mL, Rfl: 0 .  pseudoephedrine (SUDAFED) 30 MG tablet, Take 30 mg by mouth as needed for congestion., Disp: , Rfl:  .  sodium chloride (OCEAN) 0.65 % SOLN nasal spray, Place 1 spray into both nostrils 4 (four) times daily., Disp: 15 mL, Rfl: 0  Current Facility-Administered Medications:  .  0.9 %  sodium chloride infusion, 500 mL, Intravenous, Continuous, Irene Shipper, MD  Allergies:  Allergies  Allergen Reactions  . Sulfa Antibiotics     Other reaction(s): Other Drainage and red eyes only allergic to sulfa eye drops  . Sulfonamide Derivatives     Eye  drops    Past Medical History, Surgical history, Social history, and Family History were reviewed and updated.  Review of Systems: As above  Physical Exam:  weight is 160 lb (72.6 kg). Her oral temperature is 97.9 F (36.6 C). Her blood pressure is 136/71 and her pulse is 68. Her respiration is 16 and oxygen saturation is 97%.   Well-developed and well-nourished white female in no obvious distress. Head and neck exam shows no ocular or oral lesions. There are no palpable cervical or supraclavicular lymph nodes. There is fullness in the right supraclavicular region. Lungs are clear. Cardiac exam regular rate and rhythm with no murmurs, rubs  or bruits. Breast exam shows bilateral mastectomies. She has implants. She has no nodularity. There is no erythema. There is no bilateral axillary adenopathy. Abdomen is soft. Shows good bowel sounds. There is no fluid wave. There is no palpable liver or spleen tip. Back exam shows no tenderness over the spine, ribs or hips. Extremities shows no clubbing, cyanosis or edema. No lymphedema is noted in her arms. She has good range of motion of her extremities. She has good strength. Skin exam shows no rashes, ecchymoses or petechia. Neurological exam is nonfocal.  Lab Results  Component Value Date   WBC 4.6 06/13/2016   HGB 14.4 06/13/2016   HCT 42.4 06/13/2016   MCV 95 06/13/2016   PLT 230 06/13/2016     Chemistry      Component Value Date/Time   NA 143 06/13/2016 0947   NA 143 12/14/2015 0932   K 4.3 06/13/2016 0947   K 4.3 12/14/2015 0932   CL 106 06/13/2016 0947   CO2 31 06/13/2016 0947   CO2 27 12/14/2015 0932   BUN 10 06/13/2016 0947   BUN 10.2 12/14/2015 0932   CREATININE 0.8 06/13/2016 0947   CREATININE 0.8 12/14/2015 0932      Component Value Date/Time   CALCIUM 9.5 06/13/2016 0947   CALCIUM 10.1 12/14/2015 0932   ALKPHOS 69 06/13/2016 0947   ALKPHOS 75 12/14/2015 0932   AST 32 06/13/2016 0947   AST 32 12/14/2015 0932   ALT 41 06/13/2016 0947   ALT 35 12/14/2015 0932   BILITOT 1.00 06/13/2016 0947   BILITOT 0.82 12/14/2015 0932         Impression and Plan: Margaret English is 71 year old white female. She has a history of recurrent ductal carcinoma the left breast. She underwent systemic chemotherapy for this. She has been out of therapy now for Was 4 over 9 years. 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                                         I have said that she had this nosebleed on Thanksgiving. She certainly has gotten over this. It looks like there was a exposed capillary.  As always, I will enjoy her stories of her adventures with cruising.  I will plan to see  her back in another 6 months.   [name], MD 6/6/20181:32 Orlene Erm

## 2016-06-14 ENCOUNTER — Telehealth: Payer: Self-pay | Admitting: *Deleted

## 2016-06-14 LAB — VITAMIN D 25 HYDROXY (VIT D DEFICIENCY, FRACTURES): VIT D 25 HYDROXY: 64.1 ng/mL (ref 30.0–100.0)

## 2016-06-14 NOTE — Telephone Encounter (Addendum)
Patient is aware of results  ----- Message from [name], MD sent at 06/14/2016  6:55 AM EDT ----- Call - Vit D level is great!!!  Margaret English

## 2016-07-31 DIAGNOSIS — N200 Calculus of kidney: Secondary | ICD-10-CM | POA: Diagnosis not present

## 2016-08-01 DIAGNOSIS — H25043 Posterior subcapsular polar age-related cataract, bilateral: Secondary | ICD-10-CM | POA: Diagnosis not present

## 2016-08-08 DIAGNOSIS — L570 Actinic keratosis: Secondary | ICD-10-CM | POA: Diagnosis not present

## 2016-08-20 DIAGNOSIS — H04222 Epiphora due to insufficient drainage, left lacrimal gland: Secondary | ICD-10-CM | POA: Diagnosis not present

## 2016-08-20 DIAGNOSIS — H11432 Conjunctival hyperemia, left eye: Secondary | ICD-10-CM | POA: Diagnosis not present

## 2016-08-20 DIAGNOSIS — H04223 Epiphora due to insufficient drainage, bilateral lacrimal glands: Secondary | ICD-10-CM | POA: Diagnosis not present

## 2016-08-20 DIAGNOSIS — H04221 Epiphora due to insufficient drainage, right lacrimal gland: Secondary | ICD-10-CM | POA: Diagnosis not present

## 2016-08-20 DIAGNOSIS — H04552 Acquired stenosis of left nasolacrimal duct: Secondary | ICD-10-CM | POA: Diagnosis not present

## 2016-08-20 DIAGNOSIS — H04551 Acquired stenosis of right nasolacrimal duct: Secondary | ICD-10-CM | POA: Diagnosis not present

## 2016-10-15 DIAGNOSIS — H04163 Lacrimal gland dislocation, bilateral lacrimal glands: Secondary | ICD-10-CM | POA: Diagnosis not present

## 2016-10-15 DIAGNOSIS — H11432 Conjunctival hyperemia, left eye: Secondary | ICD-10-CM | POA: Diagnosis not present

## 2016-10-15 DIAGNOSIS — H02413 Mechanical ptosis of bilateral eyelids: Secondary | ICD-10-CM | POA: Diagnosis not present

## 2016-10-15 DIAGNOSIS — H04223 Epiphora due to insufficient drainage, bilateral lacrimal glands: Secondary | ICD-10-CM | POA: Diagnosis not present

## 2016-10-15 DIAGNOSIS — H04551 Acquired stenosis of right nasolacrimal duct: Secondary | ICD-10-CM | POA: Diagnosis not present

## 2016-10-15 DIAGNOSIS — H04221 Epiphora due to insufficient drainage, right lacrimal gland: Secondary | ICD-10-CM | POA: Diagnosis not present

## 2016-10-15 DIAGNOSIS — H04552 Acquired stenosis of left nasolacrimal duct: Secondary | ICD-10-CM | POA: Diagnosis not present

## 2016-10-15 DIAGNOSIS — H04222 Epiphora due to insufficient drainage, left lacrimal gland: Secondary | ICD-10-CM | POA: Diagnosis not present

## 2016-10-15 DIAGNOSIS — H04412 Chronic dacryocystitis of left lacrimal passage: Secondary | ICD-10-CM | POA: Diagnosis not present

## 2016-11-05 DIAGNOSIS — L570 Actinic keratosis: Secondary | ICD-10-CM | POA: Diagnosis not present

## 2016-11-05 DIAGNOSIS — Z23 Encounter for immunization: Secondary | ICD-10-CM | POA: Diagnosis not present

## 2016-11-05 DIAGNOSIS — L821 Other seborrheic keratosis: Secondary | ICD-10-CM | POA: Diagnosis not present

## 2016-12-14 DIAGNOSIS — C50912 Malignant neoplasm of unspecified site of left female breast: Secondary | ICD-10-CM | POA: Diagnosis not present

## 2016-12-14 DIAGNOSIS — J309 Allergic rhinitis, unspecified: Secondary | ICD-10-CM | POA: Diagnosis not present

## 2016-12-14 DIAGNOSIS — A6000 Herpesviral infection of urogenital system, unspecified: Secondary | ICD-10-CM | POA: Diagnosis not present

## 2016-12-14 DIAGNOSIS — Z1159 Encounter for screening for other viral diseases: Secondary | ICD-10-CM | POA: Diagnosis not present

## 2016-12-14 DIAGNOSIS — E559 Vitamin D deficiency, unspecified: Secondary | ICD-10-CM | POA: Diagnosis not present

## 2016-12-14 DIAGNOSIS — Z1389 Encounter for screening for other disorder: Secondary | ICD-10-CM | POA: Diagnosis not present

## 2016-12-14 DIAGNOSIS — M818 Other osteoporosis without current pathological fracture: Secondary | ICD-10-CM | POA: Diagnosis not present

## 2016-12-14 DIAGNOSIS — Z131 Encounter for screening for diabetes mellitus: Secondary | ICD-10-CM | POA: Diagnosis not present

## 2016-12-14 DIAGNOSIS — Z0000 Encounter for general adult medical examination without abnormal findings: Secondary | ICD-10-CM | POA: Diagnosis not present

## 2016-12-14 DIAGNOSIS — E782 Mixed hyperlipidemia: Secondary | ICD-10-CM | POA: Diagnosis not present

## 2016-12-18 ENCOUNTER — Encounter: Payer: Self-pay | Admitting: Hematology & Oncology

## 2016-12-18 ENCOUNTER — Other Ambulatory Visit (HOSPITAL_BASED_OUTPATIENT_CLINIC_OR_DEPARTMENT_OTHER): Payer: Medicare Other

## 2016-12-18 ENCOUNTER — Other Ambulatory Visit: Payer: Self-pay

## 2016-12-18 ENCOUNTER — Ambulatory Visit (HOSPITAL_BASED_OUTPATIENT_CLINIC_OR_DEPARTMENT_OTHER): Payer: Medicare Other | Admitting: Hematology & Oncology

## 2016-12-18 VITALS — BP 151/64 | HR 75 | Temp 98.0°F | Wt 155.2 lb

## 2016-12-18 DIAGNOSIS — K5909 Other constipation: Secondary | ICD-10-CM

## 2016-12-18 DIAGNOSIS — Z853 Personal history of malignant neoplasm of breast: Secondary | ICD-10-CM | POA: Diagnosis not present

## 2016-12-18 DIAGNOSIS — E559 Vitamin D deficiency, unspecified: Secondary | ICD-10-CM

## 2016-12-18 DIAGNOSIS — C50011 Malignant neoplasm of nipple and areola, right female breast: Secondary | ICD-10-CM

## 2016-12-18 LAB — CMP (CANCER CENTER ONLY)
ALT: 32 U/L (ref 10–47)
AST: 28 U/L (ref 11–38)
Albumin: 3.8 g/dL (ref 3.3–5.5)
Alkaline Phosphatase: 73 U/L (ref 26–84)
BILIRUBIN TOTAL: 1 mg/dL (ref 0.20–1.60)
BUN, Bld: 11 mg/dL (ref 7–22)
CALCIUM: 10.3 mg/dL (ref 8.0–10.3)
CO2: 30 meq/L (ref 18–33)
CREATININE: 0.9 mg/dL (ref 0.6–1.2)
Chloride: 103 mEq/L (ref 98–108)
GLUCOSE: 101 mg/dL (ref 73–118)
Potassium: 4.2 mEq/L (ref 3.3–4.7)
Sodium: 147 mEq/L — ABNORMAL HIGH (ref 128–145)
Total Protein: 6.9 g/dL (ref 6.4–8.1)

## 2016-12-18 LAB — CBC WITH DIFFERENTIAL (CANCER CENTER ONLY)
BASO#: 0.1 10*3/uL (ref 0.0–0.2)
BASO%: 1.1 % (ref 0.0–2.0)
EOS%: 2.3 % (ref 0.0–7.0)
Eosinophils Absolute: 0.1 10*3/uL (ref 0.0–0.5)
HCT: 42.7 % (ref 34.8–46.6)
HGB: 14.5 g/dL (ref 11.6–15.9)
LYMPH#: 2 10*3/uL (ref 0.9–3.3)
LYMPH%: 37.2 % (ref 14.0–48.0)
MCH: 32.1 pg (ref 26.0–34.0)
MCHC: 34 g/dL (ref 32.0–36.0)
MCV: 95 fL (ref 81–101)
MONO#: 0.5 10*3/uL (ref 0.1–0.9)
MONO%: 9.1 % (ref 0.0–13.0)
NEUT%: 50.3 % (ref 39.6–80.0)
NEUTROS ABS: 2.7 10*3/uL (ref 1.5–6.5)
PLATELETS: 223 10*3/uL (ref 145–400)
RBC: 4.52 10*6/uL (ref 3.70–5.32)
RDW: 12.1 % (ref 11.1–15.7)
WBC: 5.3 10*3/uL (ref 3.9–10.0)
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
## 2017-02-25 DIAGNOSIS — M8588 Other specified disorders of bone density and structure, other site: Secondary | ICD-10-CM | POA: Diagnosis not present

## 2017-02-25 DIAGNOSIS — M810 Age-related osteoporosis without current pathological fracture: Secondary | ICD-10-CM | POA: Diagnosis not present

## 2017-05-22 DIAGNOSIS — Z87442 Personal history of urinary calculi: Secondary | ICD-10-CM | POA: Diagnosis not present

## 2017-06-12 DIAGNOSIS — N9089 Other specified noninflammatory disorders of vulva and perineum: Secondary | ICD-10-CM | POA: Diagnosis not present

## 2017-06-17 DIAGNOSIS — H26492 Other secondary cataract, left eye: Secondary | ICD-10-CM | POA: Diagnosis not present

## 2017-06-18 ENCOUNTER — Other Ambulatory Visit: Payer: Self-pay

## 2017-06-18 ENCOUNTER — Inpatient Hospital Stay: Payer: Medicare Other | Attending: Hematology & Oncology | Admitting: Hematology & Oncology

## 2017-06-18 ENCOUNTER — Encounter: Payer: Self-pay | Admitting: Hematology & Oncology

## 2017-06-18 ENCOUNTER — Inpatient Hospital Stay: Payer: Medicare Other

## 2017-06-18 VITALS — BP 134/70 | HR 79 | Temp 98.3°F | Resp 16 | Ht 66.0 in | Wt 163.0 lb

## 2017-06-18 DIAGNOSIS — E559 Vitamin D deficiency, unspecified: Secondary | ICD-10-CM | POA: Insufficient documentation

## 2017-06-18 DIAGNOSIS — C50011 Malignant neoplasm of nipple and areola, right female breast: Secondary | ICD-10-CM

## 2017-06-18 DIAGNOSIS — Z853 Personal history of malignant neoplasm of breast: Secondary | ICD-10-CM

## 2017-06-18 DIAGNOSIS — E782 Mixed hyperlipidemia: Secondary | ICD-10-CM | POA: Insufficient documentation

## 2017-06-18 DIAGNOSIS — M2669 Other specified disorders of temporomandibular joint: Secondary | ICD-10-CM | POA: Diagnosis not present

## 2017-06-18 DIAGNOSIS — A6000 Herpesviral infection of urogenital system, unspecified: Secondary | ICD-10-CM | POA: Insufficient documentation

## 2017-06-18 DIAGNOSIS — M810 Age-related osteoporosis without current pathological fracture: Secondary | ICD-10-CM | POA: Insufficient documentation

## 2017-06-18 DIAGNOSIS — K5730 Diverticulosis of large intestine without perforation or abscess without bleeding: Secondary | ICD-10-CM | POA: Insufficient documentation

## 2017-06-18 DIAGNOSIS — R05 Cough: Secondary | ICD-10-CM | POA: Diagnosis not present

## 2017-06-18 DIAGNOSIS — C50919 Malignant neoplasm of unspecified site of unspecified female breast: Secondary | ICD-10-CM | POA: Insufficient documentation

## 2017-06-18 LAB — CMP (CANCER CENTER ONLY)
ALBUMIN: 4 g/dL (ref 3.5–5.0)
ALT: 28 U/L (ref 0–55)
AST: 25 U/L (ref 5–34)
Alkaline Phosphatase: 69 U/L (ref 40–150)
Anion gap: 6 (ref 3–11)
BILIRUBIN TOTAL: 0.6 mg/dL (ref 0.2–1.2)
BUN: 17 mg/dL (ref 7–26)
CHLORIDE: 105 mmol/L (ref 98–109)
CO2: 30 mmol/L — ABNORMAL HIGH (ref 22–29)
Calcium: 10.2 mg/dL (ref 8.4–10.4)
Creatinine: 0.75 mg/dL (ref 0.60–1.10)
GFR, Est AFR Am: >60 mL/min (ref >60)
GFR, Estimated: >60 mL/min (ref >60)
GLUCOSE: 97 mg/dL (ref 70–140)
POTASSIUM: 4.4 mmol/L (ref 3.5–5.1)
SODIUM: 141 mmol/L (ref 136–145)
Total Protein: 6.7 g/dL (ref 6.4–8.3)

## 2017-06-18 LAB — CBC WITH DIFFERENTIAL (CANCER CENTER ONLY)
Basophils Absolute: 0 10*3/uL (ref 0.0–0.1)
Basophils Relative: 1 %
Eosinophils Absolute: 0.2 10*3/uL (ref 0.0–0.5)
Eosinophils Relative: 4 %
HEMATOCRIT: 42.7 % (ref 34.8–46.6)
Hemoglobin: 14.3 g/dL (ref 11.6–15.9)
Lymphocytes Relative: 34 %
Lymphs Abs: 1.6 10*3/uL (ref 0.9–3.3)
MCH: 32 pg (ref 26.0–34.0)
MCHC: 33.5 g/dL (ref 32.0–36.0)
MCV: 95.5 fL (ref 81.0–101.0)
MONO ABS: 0.4 10*3/uL (ref 0.1–0.9)
Monocytes Relative: 8 %
NEUTROS ABS: 2.5 10*3/uL (ref 1.5–6.5)
NEUTROS PCT: 53 %
PLATELETS: 227 10*3/uL (ref 145–400)
RBC: 4.47 MIL/uL (ref 3.70–5.32)
RDW: 12 % (ref 11.1–15.7)
WBC Count: 4.7 10*3/uL (ref 3.9–10.0)

## 2017-06-18 NOTE — Progress Notes (Signed)
Hematology and Oncology Follow Up Visit  Margaret English [id number] [birth year] 72 y.o. 06/18/2017   Principle Diagnosis:  1. 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 2. History of pulmonary emboli.  Current Therapy:    Aspirin 162 mg by mouth daily     Interim History:  [name] is back for follow-up.  She is doing quite well.  After her office visit today, she will be going to [place].  1 of her children are having their 20th wedding anniversary.  Apparently there will be a lot of surprises that will be presented.  She, as always, has been traveling.  She her husband were in [place] back in April.  She has had a little bit of a dry cough.  This been going on for about a month.  There is no fever associated with the dry cough.  She also is complaining of pain in the right TMJ.  I am not sure what might be going on there.  However, given that she has breast cancer, I think we really need to do a scan to find out what might be going on.  It might be plain deterioration of the TMJ.  However, it also might be metastatic disease.  Otherwise, she feels okay.  She is had no change in bowel or bladder habits.  There is been no change in medications.  She has had no leg swelling.  Overall, her performance status is ECOG 0  Medications:  Current Outpatient Medications:  .  acyclovir (ZOVIRAX) 200 MG capsule, as needed. , Disp: , Rfl:  .  acyclovir cream (ZOVIRAX) 5 %, Apply topically as needed., Disp: , Rfl:  .  alprazolam (XANAX) 2 MG tablet, Take 1 tablet (2 mg total) by mouth at bedtime as needed for sleep., Disp: 30 tablet, Rfl: 2 .  aspirin 81 MG EC tablet, Take 162 mg by mouth daily. Swallow whole., Disp: , Rfl:  .  Calcium Citrate-Vitamin D (CALCIUM CITRATE + D PO), Take 2 tablets by mouth 2 (two) times daily. , Disp: , Rfl:  .  Cetirizine HCl (ZYRTEC ALLERGY PO), Take by mouth every morning., Disp: , Rfl:  .  Cholecalciferol  (VITAMIN D) 2000 units CAPS, Take 2,000 Units by mouth 2 (two) times daily. 2 or 3 tabs daily, Disp: , Rfl:  .  fluticasone (FLONASE) 50 MCG/ACT nasal spray, Place into both nostrils as needed for allergies or rhinitis., Disp: , Rfl:  .  Multiple Vitamins-Minerals (SENIOR MULTIVITAMIN PLUS PO), Take by mouth every morning., Disp: , Rfl:  .  mupirocin cream (BACTROBAN) 2 %, Apply 1 application topically 3 (three) times daily., Disp: 15 g, Rfl: 0 .  oxyCODONE-acetaminophen (PERCOCET/ROXICET) 5-325 MG tablet, Take 1 tablet by mouth every 4 (four) hours as needed for severe pain., Disp: , Rfl:  .  oxymetazoline (AFRIN EXTRA MOISTURIZING) 0.05 % nasal spray, Place 1 spray into both nostrils 2 (two) times daily as needed (Nosebleed)., Disp: 30 mL, Rfl: 0 .  pseudoephedrine (SUDAFED) 30 MG tablet, Take 30 mg by mouth as needed for congestion., Disp: , Rfl:  .  sodium chloride (OCEAN) 0.65 % SOLN nasal spray, Place 1 spray into both nostrils 4 (four) times daily., Disp: 15 mL, Rfl: 0  Current Facility-Administered Medications:  .  0.9 %  sodium chloride infusion, 500 mL, Intravenous, Continuous, Irene Shipper, MD  Allergies:  Allergies  Allergen Reactions  . Sulfa Antibiotics  Other reaction(s): Other Drainage and red eyes only allergic to sulfa eye drops  . Sulfonamide Derivatives     Eye drops    Past Medical History, Surgical history, Social history, and Family History were reviewed and updated.  Review of Systems: Review of Systems  All other systems reviewed and are negative.   Physical Exam:  vitals were not taken for this visit.   Physical Exam  Constitutional: She is oriented to person, place, and time.  HENT:  Head: Normocephalic and atraumatic.  Mouth/Throat: Oropharynx is clear and moist.  Eyes: Pupils are equal, round, and reactive to light. EOM are normal.  Neck: Normal range of motion.  Cardiovascular: Normal rate, regular rhythm and normal heart sounds.    Pulmonary/Chest: Effort normal and breath sounds normal.  Abdominal: Soft. Bowel sounds are normal.  Musculoskeletal: Normal range of motion. She exhibits no edema, tenderness or deformity.  Chest wall exam shows bilateral implants.  She had mastectomies bilaterally.  No erythema or nodularity is noted on the anterior chest wall.  She has no axillary adenopathy bilaterally.  Lymphadenopathy:    She has no cervical adenopathy.  Neurological: She is alert and oriented to person, place, and time.  Skin: Skin is warm and dry. No rash noted. No erythema.  Psychiatric: She has a normal mood and affect. Her behavior is normal. Judgment and thought content normal.  Vitals reviewed.    Lab Results  Component Value Date   WBC 5.3 12/18/2016   HGB 14.5 12/18/2016   HCT 42.7 12/18/2016   MCV 95 12/18/2016   PLT 223 12/18/2016     Chemistry      Component Value Date/Time   NA 147 (H) 12/18/2016 1125   NA 143 12/14/2015 0932   K 4.2 12/18/2016 1125   K 4.3 12/14/2015 0932   CL 103 12/18/2016 1125   CO2 30 12/18/2016 1125   CO2 27 12/14/2015 0932   BUN 11 12/18/2016 1125   BUN 10.2 12/14/2015 0932   CREATININE 0.9 12/18/2016 1125   CREATININE 0.8 12/14/2015 0932      Component Value Date/Time   CALCIUM 10.3 12/18/2016 1125   CALCIUM 10.1 12/14/2015 0932   ALKPHOS 73 12/18/2016 1125   ALKPHOS 75 12/14/2015 0932   AST 28 12/18/2016 1125   AST 32 12/14/2015 0932   ALT 32 12/18/2016 1125   ALT 35 12/14/2015 0932   BILITOT 1.00 12/18/2016 1125   BILITOT 0.82 12/14/2015 0932         Impression and Plan: Margaret English is 72 year old white female. She has a history of recurrent ductal carcinoma the left breast. She underwent systemic chemotherapy for this. She has been out of therapy now for over 10 years. 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                                       I will go ahead and get her set up with our scans.  I think this will be helpful.  We will see about  getting these set up next week.  I think this will be quite helpful.  If all looks good, we will then get her back to see [place] in another 6 months.[name], MD 6/11/201912:09 PM

## 2017-06-19 LAB — VITAMIN D 25 HYDROXY (VIT D DEFICIENCY, FRACTURES): Vit D, 25-Hydroxy: 65.2 ng/mL (ref 30.0–100.0)

## 2017-06-21 DIAGNOSIS — H26493 Other secondary cataract, bilateral: Secondary | ICD-10-CM | POA: Diagnosis not present

## 2017-07-08 DIAGNOSIS — R05 Cough: Secondary | ICD-10-CM | POA: Diagnosis not present

## 2017-07-23 ENCOUNTER — Ambulatory Visit (HOSPITAL_BASED_OUTPATIENT_CLINIC_OR_DEPARTMENT_OTHER)
Admission: RE | Admit: 2017-07-23 | Discharge: 2017-07-23 | Disposition: A | Discharge status: Home / Self Care | Payer: Medicare Other | Source: Ambulatory Visit | Attending: Hematology & Oncology | Admitting: Hematology & Oncology

## 2017-07-23 ENCOUNTER — Encounter (HOSPITAL_BASED_OUTPATIENT_CLINIC_OR_DEPARTMENT_OTHER): Payer: Self-pay

## 2017-07-23 ENCOUNTER — Encounter: Payer: Self-pay | Admitting: Hematology & Oncology

## 2017-07-23 DIAGNOSIS — J439 Emphysema, unspecified: Secondary | ICD-10-CM | POA: Insufficient documentation

## 2017-07-23 DIAGNOSIS — M2669 Other specified disorders of temporomandibular joint: Secondary | ICD-10-CM | POA: Insufficient documentation

## 2017-07-23 DIAGNOSIS — C50011 Malignant neoplasm of nipple and areola, right female breast: Secondary | ICD-10-CM | POA: Insufficient documentation

## 2017-07-23 DIAGNOSIS — R918 Other nonspecific abnormal finding of lung field: Secondary | ICD-10-CM | POA: Insufficient documentation

## 2017-07-23 DIAGNOSIS — I700 Atherosclerosis of aorta: Secondary | ICD-10-CM | POA: Diagnosis not present

## 2017-07-23 MED ORDER — IOPAMIDOL (ISOVUE-300) INJECTION 61%
100.0000 mL | Freq: Once | INTRAVENOUS | Status: AC | PRN
  Administered 2017-07-23: 100 mL via INTRAVENOUS

## 2017-07-24 ENCOUNTER — Telehealth: Payer: Self-pay | Admitting: *Deleted

## 2017-07-24 NOTE — Telephone Encounter (Signed)
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  Your dentist should be able to help this.  NO cancer in the area!!!  Margaret English

## 2017-07-25 ENCOUNTER — Telehealth: Payer: Self-pay | Admitting: *Deleted

## 2017-07-25 NOTE — Telephone Encounter (Signed)
Message left from pt returning call for CT results from yesterday.  Call placed back to patient and pt notified per order of Dr. Marin Olp that CT of chest is negative for cancer, there is arthritis in TMJ that pt.'s dentist should be able to help with and that there is no cancer in the jaw area.  Patient appreciative of call and has no questions at this time.

## 2017-07-25 NOTE — Telephone Encounter (Signed)
-----   Message from [name], MD sent at 07/24/2017  5:53 AM EDT ----- Call - CT of the chest is (-) for any cancer.  pete

## 2017-07-27 ENCOUNTER — Other Ambulatory Visit: Payer: Self-pay | Admitting: Hematology & Oncology

## 2017-07-27 DIAGNOSIS — R911 Solitary pulmonary nodule: Secondary | ICD-10-CM

## 2017-11-11 DIAGNOSIS — L821 Other seborrheic keratosis: Secondary | ICD-10-CM | POA: Diagnosis not present

## 2017-11-11 DIAGNOSIS — Z23 Encounter for immunization: Secondary | ICD-10-CM | POA: Diagnosis not present

## 2017-11-11 DIAGNOSIS — L820 Inflamed seborrheic keratosis: Secondary | ICD-10-CM | POA: Diagnosis not present

## 2017-12-11 ENCOUNTER — Telehealth: Payer: Self-pay | Admitting: Hematology & Oncology

## 2017-12-11 NOTE — Telephone Encounter (Signed)
Returned call to pt to confirm next appts 12/18/17 at 1130 am

## 2017-12-18 ENCOUNTER — Ambulatory Visit: Payer: Medicare Other | Admitting: Hematology & Oncology

## 2017-12-18 ENCOUNTER — Other Ambulatory Visit: Payer: Medicare Other

## 2018-01-06 ENCOUNTER — Encounter: Payer: Self-pay | Admitting: Hematology & Oncology

## 2018-01-06 ENCOUNTER — Inpatient Hospital Stay: Payer: Medicare Other | Attending: Hematology & Oncology

## 2018-01-06 ENCOUNTER — Telehealth: Payer: Self-pay | Admitting: Hematology & Oncology

## 2018-01-06 ENCOUNTER — Inpatient Hospital Stay (HOSPITAL_BASED_OUTPATIENT_CLINIC_OR_DEPARTMENT_OTHER): Payer: Medicare Other | Admitting: Hematology & Oncology

## 2018-01-06 ENCOUNTER — Other Ambulatory Visit: Payer: Self-pay

## 2018-01-06 VITALS — BP 153/80 | HR 78 | Temp 98.1°F | Resp 18 | Wt 160.0 lb

## 2018-01-06 DIAGNOSIS — Z7982 Long term (current) use of aspirin: Secondary | ICD-10-CM | POA: Insufficient documentation

## 2018-01-06 DIAGNOSIS — E782 Mixed hyperlipidemia: Secondary | ICD-10-CM

## 2018-01-06 DIAGNOSIS — R05 Cough: Secondary | ICD-10-CM

## 2018-01-06 DIAGNOSIS — C50311 Malignant neoplasm of lower-inner quadrant of right female breast: Secondary | ICD-10-CM

## 2018-01-06 DIAGNOSIS — E559 Vitamin D deficiency, unspecified: Secondary | ICD-10-CM

## 2018-01-06 DIAGNOSIS — Z853 Personal history of malignant neoplasm of breast: Secondary | ICD-10-CM | POA: Insufficient documentation

## 2018-01-06 DIAGNOSIS — R079 Chest pain, unspecified: Secondary | ICD-10-CM | POA: Diagnosis not present

## 2018-01-06 DIAGNOSIS — Z171 Estrogen receptor negative status [ER-]: Principal | ICD-10-CM

## 2018-01-06 DIAGNOSIS — C50312 Malignant neoplasm of lower-inner quadrant of left female breast: Principal | ICD-10-CM

## 2018-01-06 DIAGNOSIS — C50011 Malignant neoplasm of nipple and areola, right female breast: Secondary | ICD-10-CM

## 2018-01-06 LAB — CMP (CANCER CENTER ONLY)
ALBUMIN: 4.1 g/dL (ref 3.5–5.0)
ALT: 20 U/L (ref 0–44)
AST: 19 U/L (ref 15–41)
Alkaline Phosphatase: 59 U/L (ref 38–126)
Anion gap: 6 (ref 5–15)
BUN: 14 mg/dL (ref 8–23)
CHLORIDE: 105 mmol/L (ref 98–111)
CO2: 31 mmol/L (ref 22–32)
Calcium: 9.6 mg/dL (ref 8.9–10.3)
Creatinine: 0.69 mg/dL (ref 0.44–1.00)
GFR, Est AFR Am: >60 mL/min (ref >60)
GFR, Estimated: >60 mL/min (ref >60)
Glucose, Bld: 95 mg/dL (ref 70–99)
Potassium: 4.2 mmol/L (ref 3.5–5.1)
Sodium: 142 mmol/L (ref 135–145)
Total Bilirubin: 0.6 mg/dL (ref 0.3–1.2)
Total Protein: 6.8 g/dL (ref 6.5–8.1)

## 2018-01-06 LAB — CBC WITH DIFFERENTIAL (CANCER CENTER ONLY)
ABS IMMATURE GRANULOCYTES: 0.01 10*3/uL (ref 0.00–0.07)
Basophils Absolute: 0.1 10*3/uL (ref 0.0–0.1)
Basophils Relative: 1 %
Eosinophils Absolute: 0.2 10*3/uL (ref 0.0–0.5)
Eosinophils Relative: 4 %
HCT: 43.3 % (ref 36.0–46.0)
HEMOGLOBIN: 14.2 g/dL (ref 12.0–15.0)
Immature Granulocytes: 0 %
LYMPHS ABS: 1.8 10*3/uL (ref 0.7–4.0)
Lymphocytes Relative: 33 %
MCH: 31.3 pg (ref 26.0–34.0)
MCHC: 32.8 g/dL (ref 30.0–36.0)
MCV: 95.4 fL (ref 80.0–100.0)
Monocytes Absolute: 0.5 10*3/uL (ref 0.1–1.0)
Monocytes Relative: 9 %
NEUTROS ABS: 2.9 10*3/uL (ref 1.7–7.7)
Neutrophils Relative %: 53 %
Platelet Count: 239 10*3/uL (ref 150–400)
RBC: 4.54 MIL/uL (ref 3.87–5.11)
RDW: 11.9 % (ref 11.5–15.5)
WBC Count: 5.4 10*3/uL (ref 4.0–10.5)
nRBC: 0 % (ref 0.0–0.2)

## 2018-01-06 LAB — LACTATE DEHYDROGENASE: LDH: 150 U/L (ref 98–192)

## 2018-01-06 MED ORDER — AZITHROMYCIN 250 MG PO TABS: ORAL_TABLET | ORAL | Status: DC

## 2018-01-06 NOTE — Progress Notes (Signed)
Hematology and Oncology Follow Up Visit  Margaret English [id number] [birth year] 72 y.o. 01/06/2018   Principle Diagnosis:  1. 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 2. History of pulmonary emboli.  Current Therapy:    Aspirin 162 mg by mouth daily     Interim History:  Ms.  Margaret English is back for follow-up.  She is doing quite well.  She had a great summer and fall.  She had a wonderful Thanksgiving and Christmas.  She is still doing cruises.  She is a Insurance claims handler.  She likes to go on new cruise ships.  Hopefully, 1 day she will be able to coordinate a trip for my wife and myself.  Healthwise, she has had no problems.  It is now been 15 years since her initial breast cancer.  Thankfully, has not come back yet and I do think that it will come back.  She is doing well with the aspirin.  She has CT scans done last time we saw her.  She is complaining of some chest pain and some cough.  CT of the chest did not show any cancer recurrence.  Looks like she may have had some bronchitis.  She had a CT scan of the face.  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  No focal bone destruction.  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  She has had no issues with bowels or bladder.  There is no arm swelling.  She has had no leg swelling.  Overall, her performance status is ECOG 0.  Medications:  Current Outpatient Medications:  .  acyclovir (ZOVIRAX) 200 MG capsule, as needed. , Disp: , Rfl:  .  acyclovir cream (ZOVIRAX) 5 %, Apply topically as needed., Disp: , Rfl:  .  alprazolam (XANAX) 2 MG tablet, Take 1 tablet (2 mg total) by mouth at bedtime as needed for sleep., Disp: 30 tablet, Rfl: 2 .  aspirin 81 MG EC tablet, Take 162 mg by mouth daily. Swallow whole., Disp: , Rfl:  .  azithromycin (ZITHROMAX Z-PAK) 250 MG tablet, Take as directed, Disp: 6 each, Rfl:  02 .  Calcium Citrate-Vitamin D (CALCIUM CITRATE + D PO), Take 2 tablets by mouth 2 (two) times daily. , Disp: , Rfl:  .  Cetirizine HCl (ZYRTEC ALLERGY PO), Take by mouth every morning., Disp: , Rfl:  .  Cholecalciferol (VITAMIN D) 2000 units CAPS, Take 2,000 Units by mouth 2 (two) times daily. 2 or 3 tabs daily, Disp: , Rfl:  .  fluticasone (FLONASE) 50 MCG/ACT nasal spray, Place into both nostrils as needed for allergies or rhinitis., Disp: , Rfl:  .  Multiple Vitamins-Minerals (SENIOR MULTIVITAMIN PLUS PO), Take by mouth every morning., Disp: , Rfl:  .  mupirocin cream (BACTROBAN) 2 %, Apply 1 application topically 3 (three) times daily., Disp: 15 g, Rfl: 0 .  oxymetazoline (AFRIN EXTRA MOISTURIZING) 0.05 % nasal spray, Place 1 spray into both nostrils 2 (two) times daily as needed (Nosebleed)., Disp: 30 mL, Rfl: 0 .  pseudoephedrine (SUDAFED) 30 MG tablet, Take 30 mg by mouth as needed for congestion., Disp: , Rfl:  .  sodium chloride (OCEAN) 0.65 % SOLN nasal spray, Place 1 spray into both nostrils 4 (four) times daily., Disp: 15 mL, Rfl: 0  Current Facility-Administered Medications:  .  0.9 %  sodium chloride infusion, 500 mL, Intravenous, Continuous, Scarlette Shorts  N, MD  Allergies:  Allergies  Allergen Reactions  . Sulfa Antibiotics     Other reaction(s): Other Drainage and red eyes only allergic to sulfa eye drops  . Sulfonamide Derivatives     Eye drops    Past Medical History, Surgical history, Social history, and Family History were reviewed and updated.  Review of Systems: Review of Systems  All other systems reviewed and are negative.   Physical Exam:  weight is 160 lb (72.6 kg). Her oral temperature is 98.1 F (36.7 C). Her blood pressure is 153/80 (abnormal) and her pulse is 78. Her respiration is 18 and oxygen saturation is 98%.   Physical Exam Vitals signs reviewed.  HENT:     Head: Normocephalic and atraumatic.  Eyes:     Pupils: Pupils are equal, round, and  reactive to light.  Neck:     Musculoskeletal: Normal range of motion.  Cardiovascular:     Rate and Rhythm: Normal rate and regular rhythm.     Heart sounds: Normal heart sounds.  Pulmonary:     Effort: Pulmonary effort is normal.     Breath sounds: Normal breath sounds.  Abdominal:     General: Bowel sounds are normal.     Palpations: Abdomen is soft.  Musculoskeletal: Normal range of motion.        General: No tenderness or deformity.     Comments: Chest wall exam shows bilateral implants.  She had mastectomies bilaterally.  No erythema or nodularity is noted on the anterior chest wall.  She has no axillary adenopathy bilaterally.  Lymphadenopathy:     Cervical: No cervical adenopathy.  Skin:    General: Skin is warm and dry.     Findings: No erythema or rash.  Neurological:     Mental Status: She is alert and oriented to person, place, and time.  Psychiatric:        Behavior: Behavior normal.        Thought Content: Thought content normal.        Judgment: Judgment normal.      Lab Results  Component Value Date   WBC 5.4 01/06/2018   HGB 14.2 01/06/2018   HCT 43.3 01/06/2018   MCV 95.4 01/06/2018   PLT 239 01/06/2018     Chemistry      Component Value Date/Time   NA 142 01/06/2018 1150   NA 147 (H) 12/18/2016 1125   NA 143 12/14/2015 0932   K 4.2 01/06/2018 1150   K 4.2 12/18/2016 1125   K 4.3 12/14/2015 0932   CL 105 01/06/2018 1150   CL 103 12/18/2016 1125   CO2 31 01/06/2018 1150   CO2 30 12/18/2016 1125   CO2 27 12/14/2015 0932   BUN 14 01/06/2018 1150   BUN 11 12/18/2016 1125   BUN 10.2 12/14/2015 0932   CREATININE 0.69 01/06/2018 1150   CREATININE 0.9 12/18/2016 1125   CREATININE 0.8 12/14/2015 0932      Component Value Date/Time   CALCIUM 9.6 01/06/2018 1150   CALCIUM 10.3 12/18/2016 1125   CALCIUM 10.1 12/14/2015 0932   ALKPHOS 59 01/06/2018 1150   ALKPHOS 73 12/18/2016 1125   ALKPHOS 75 12/14/2015 0932   AST 19 01/06/2018 1150   AST 32  12/14/2015 0932   ALT 20 01/06/2018 1150   ALT 32 12/18/2016 1125   ALT 35 12/14/2015 0932   BILITOT 0.6 01/06/2018 1150   BILITOT 0.82 12/14/2015 0932         Impression  and Plan: [name] is 72 year old white female. She has a history of recurrent ductal carcinoma the left breast. She underwent systemic chemotherapy for this. She has been out of therapy now for over 10 years. 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                                       Everything right now looks fantastic.  She has [place] no symptoms.  We will get her back in 6 more months.  I think this would be reasonable.  [name], MD 12/30/20191:25 PM

## 2018-01-06 NOTE — Telephone Encounter (Signed)
Appointments scheduled letter/calendar mailed per 12/30 los 

## 2018-01-07 LAB — CANCER ANTIGEN 27.29: CA 27.29: 19.1 U/mL (ref 0.0–38.6)

## 2018-01-09 ENCOUNTER — Other Ambulatory Visit: Payer: Self-pay | Admitting: Family

## 2018-01-20 ENCOUNTER — Encounter: Payer: Self-pay | Admitting: Physical Therapy

## 2018-01-20 ENCOUNTER — Other Ambulatory Visit: Payer: Self-pay

## 2018-01-20 ENCOUNTER — Ambulatory Visit: Payer: Medicare Other | Attending: Family Medicine | Admitting: Physical Therapy

## 2018-01-20 DIAGNOSIS — R2689 Other abnormalities of gait and mobility: Secondary | ICD-10-CM | POA: Insufficient documentation

## 2018-01-20 DIAGNOSIS — M25552 Pain in left hip: Secondary | ICD-10-CM

## 2018-01-21 NOTE — Therapy (Signed)
[place], [place], [address] Phone: [phone]   Fax:  [phone]  Physical Therapy Evaluation  Patient Details  Name: Margaret English MRN: [id number] Date of Birth: 03-07-1945 Referring Provider (PT): Theadore Nan MD    Encounter Date: 01/20/2018  PT End of Session - 01/20/18 1352    Visit Number  1    Number of Visits  12    Date for PT Re-Evaluation  03/03/18    Authorization Type  medicare primary     PT Start Time  1335    PT Stop Time  1415    PT Time Calculation (min)  40 min    Activity Tolerance  Patient tolerated treatment well    Behavior During Therapy  [facility] for tasks assessed/performed       Past Medical History:  Diagnosis Date  . Allergy   . Cancer ([place])    Breast   . Cataract   . Chronic kidney disease    kidney stones  . History of kidney stones   . 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 Osteopenia   . Osteoporosis     Past Surgical History:  Procedure Laterality Date  . ABDOMINAL HYSTERECTOMY    . EYE SURGERY Bilateral    2016- Cataracts  . MASTECTOMY Bilateral    2009    There were no vitals filed for this visit.   Subjective Assessment - 01/20/18 1342    Subjective  Patient has had a sic month history of shapr pain in her naterior left hip. She had no previous history of hip pain. She feesl like something is "catching" in the hip. She sits a lot for work.     How long can you stand comfortably?  depends on her positioning     Diagnostic tests  Nothing     Patient Stated Goals  Pain and catching in the hip at times     Currently in Pain?  Yes   No pain right this second sitting here    Pain Score  8     Pain Location  Hip    Pain Orientation  Left    Pain Descriptors / Indicators  Sharp    Pain Type  Chronic pain    Pain Onset  More than a month ago    Pain Frequency  Constant    Aggravating Factors   certain movements feel like  they catch     Pain Relieving Factors  comes and goes     Effect of Pain on Daily Activities  difficulty perfroming ADL's          [facility] PT Assessment - 01/21/18 0001      Assessment   Medical Diagnosis  Lefrt Hip Pain     Referring Provider (PT)  Theadore Nan MD     Onset Date/Surgical Date  --   6 months prior    Hand Dominance  Right    Next MD Visit  Nothing scheduled     Prior Therapy  None       Precautions   Precautions  None      Restrictions   Weight Bearing Restrictions  No      Balance Screen   Has the patient fallen in the past 6 months  Yes   fell going down steps    Has the patient had a decrease in activity level because of a fear of  falling?   No    Is the patient reluctant to leave their home because of a fear of falling?   No      Prior Function   Vocation Requirements  works as a Garment/textile technologist. Has to sit a lot/      Cognition   Overall Cognitive Status  Within Functional Limits for tasks assessed    Attention  Focused    Focused Attention  Appears intact    Memory  Appears intact    Awareness  Appears intact    Problem Solving  Appears intact      Observation/Other Assessments   Skin Integrity  23% limitation       Sensation   Light Touch  Appears Intact      Coordination   Gross Motor Movements are Fluid and Coordinated  Yes    Fine Motor Movements are Fluid and Coordinated  Yes      Functional Tests   Functional tests  Single leg stance      Single Leg Stance   Comments  slight decrease in left single leg stance time      Posture/Postural Control   Posture Comments  Normal posture       AROM   AROM Assessment Site  Lumbar    Lumbar Flexion  25% limited     Lumbar - Right Side Bend  no limit     Lumbar - Left Side Bend  limited 25% with pain     Lumbar - Right Rotation  no limit     Lumbar - Left Rotation  limited 25 %       PROM   Overall PROM Comments  reported tightness with end range flexion and IR       Strength    Overall Strength Comments  5/5 foot and ankle     Strength Assessment Site  Hip    Right/Left Hip  Right;Left    Right Hip Flexion  5/5    Right Hip ABduction  5/5    Right Hip ADduction  5/5    Left Hip Flexion  4+/5    Left Hip ABduction  5/5    Left Hip ADduction  5/5      Palpation   Palpation comment  mild tenderness to palpation in the anteriro hip       Ambulation/Gait   Gait Comments  slight lateral pertabations                 Objective measurements completed on examination: See above findings.      [facility] Adult PT Treatment/Exercise - 01/21/18 0001      Exercises   Exercises  Lumbar      Lumbar Exercises: Stretches   Active Hamstring Stretch Limitations  reviewed seated and straps 2x20 sec holds     Other Lumbar Stretch Exercise  2x20 sec hold     Other Lumbar Stretch Exercise  thomas stretch 3x20 sec hold              PT Education - 01/20/18 1352    Education Details  HEP, symptom mangement    Person(s) Educated  Patient    Methods  Tactile cues;Verbal cues;Demonstration;Explanation    Comprehension  Verbalized understanding;Returned demonstration;Verbal cues required;Tactile cues required;Need further instruction       PT Short Term Goals - 01/20/18 1447      PT SHORT TERM GOAL #1   Title  Patient will dmeonstrate equal motion of the left  and right hip    Time  3    Period  Weeks    Status  New    Target Date  02/10/18      PT SHORT TERM GOAL #2   Title  Patient will be independent with basic stretching and strengthening program     Time  3    Period  Weeks    Target Date  02/10/18      PT SHORT TERM GOAL #3   Title  Patient will demonstrate full bilateral trunk rotation without self report of tightness     Time  3    Period  Weeks    Status  New    Target Date  02/10/18        PT Long Term Goals - 01/20/18 1508      PT LONG TERM GOAL #1   Title  Patient will demonstrate a 24% limiation on FOTO     Time  6    Period   Weeks    Status  New    Target Date  03/03/18      PT LONG TERM GOAL #2   Title  Patient will sit for 1 hour at work with out self report of tightness in the hip     Time  6    Period  Weeks    Status  New    Target Date  03/03/18      PT LONG TERM GOAL #3   Title  Patient will report no snapping in her anterior hip in any position in order to perfrom ADL's     Time  6    Period  Weeks    Status  New    Target Date  03/03/18             Plan - 01/20/18 1526    Clinical Impression Statement  Patient is a 72 year old female with left anterior hip popping and pain. Her motion is WNL but she does present with imbalances between the left and right. Sings and symptoms are consistetn with anterior popping hip syndroem. She sits all day. She has mild tenderness to the anterior proximal hip. She would benefit from skilled therapy to improve hip stability and stretch her illiopsoas.     Clinical Presentation  Evolving    Clinical Decision Making  Moderate    PT Frequency  2x / week    PT Duration  6 weeks    PT Treatment/Interventions  ADLs/Self Care Home Management;Cryotherapy;Ultrasound;Iontophoresis 4mg /ml Dexamethasone;DME Instruction;Gait training;Stair training;Functional mobility training;Therapeutic exercise;Therapeutic activities;Neuromuscular re-education;Cognitive remediation;Patient/family education;Manual techniques;Passive range of motion;Taping    PT Next Visit Plan  anterior hip stretching; manual therapy to the anterior hip if needed; [place] release; signle leg stance; briging series; clam shell SL; LAD if needed;     PT Home Exercise Plan  thomas stretch; pirifromis stretch; hamstring stretch; bridge     Consulted and Agree with Plan of Care  Patient       Patient will benefit from skilled therapeutic intervention in order to improve the following deficits and impairments:  Pain, Decreased activity tolerance, Decreased range of motion, Increased fascial  restricitons  Visit Diagnosis: Pain in left hip  Other abnormalities of gait and mobility     Problem List Patient Active Problem List   Diagnosis Date Noted  . 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 Genital herpes simplex 06/18/2017  . Malignant neoplasm of female breast (  [place]) 06/18/2017  . Mixed hyperlipidemia 06/18/2017  . Senile osteoporosis 06/18/2017  . Vitamin D deficiency 06/18/2017    Carney Living PT DPT  01/21/2018, 12:45 PM  [facility] [address] [place], [place], [address] Phone: [phone]   Fax:  [phone]  Name: Margaret English MRN: [id number] Date of Birth: [birth year]

## 2018-01-28 ENCOUNTER — Encounter: Payer: Medicare Other | Admitting: Physical Therapy

## 2018-01-29 ENCOUNTER — Ambulatory Visit: Payer: Medicare Other | Admitting: Physical Therapy

## 2018-01-29 DIAGNOSIS — R2689 Other abnormalities of gait and mobility: Secondary | ICD-10-CM

## 2018-01-29 DIAGNOSIS — M25552 Pain in left hip: Secondary | ICD-10-CM | POA: Diagnosis not present

## 2018-01-30 NOTE — Therapy (Addendum)
[place], [place], [address] Phone: [phone]   Fax:  [phone]  Physical Therapy Treatment/Discharge   Patient Details  Name: Margaret English MRN: [id number] Date of Birth: [birth year] Referring Provider (PT): Theadore Nan MD    Encounter Date: 01/29/2018  PT End of Session - 01/30/18 1605    Visit Number  2    Number of Visits  12    Date for PT Re-Evaluation  03/03/18    Authorization Type  medicare primary     PT Start Time  1545    PT Stop Time  1624    PT Time Calculation (min)  39 min    Activity Tolerance  Patient tolerated treatment well    Behavior During Therapy  [facility] for tasks assessed/performed       Past Medical History:  Diagnosis Date  . Allergy   . Cancer ([place])    Breast   . Cataract   . Chronic kidney disease    kidney stones  . History of kidney stones   . 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 Osteopenia   . Osteoporosis     Past Surgical History:  Procedure Laterality Date  . ABDOMINAL HYSTERECTOMY    . EYE SURGERY Bilateral    2016- Cataracts  . MASTECTOMY Bilateral    2009    There were no vitals filed for this visit.  Subjective Assessment - 01/30/18 1602    Subjective  Patient has had no pain or catching since her last visit. She also reports she has not been very comliant. She is just doing a few stretches.     How long can you stand comfortably?  depends on her positioning     Patient Stated Goals  Pain and catching in the hip at times     Currently in Pain?  No/denies                       [facility] Adult PT Treatment/Exercise - 01/30/18 0001      Lumbar Exercises: Stretches   Active Hamstring Stretch Limitations  with strap 3x20 sec     Other Lumbar Stretch Exercise  pirifromis stretch 2x20 sec hold     Other Lumbar Stretch Exercise  thomas stretch 3x20 sec hold       Lumbar Exercises: Standing   Other  Standing Lumbar Exercises  standing 3 way hip 2x10 each leg with cuing and education       Lumbar Exercises: Supine   Clam  20 reps    Bridge  20 reps    Other Supine Lumbar Exercises  supine march with education on how to advance the exercise 2x10    Other Supine Lumbar Exercises  ball sqeeze 2x10              PT Education - 01/30/18 1603    Education Details  updated HEP     Person(s) Educated  Patient    Methods  Demonstration;Explanation;Tactile cues;Verbal cues;Handout    Comprehension  Verbalized understanding;Returned demonstration;Verbal cues required;Tactile cues required;Need further instruction       PT Short Term Goals - 01/30/18 1607      PT SHORT TERM GOAL #1   Title  Patient will dmeonstrate equal motion of the left and right hip    Time  3    Period  Weeks    Status  On-going  PT SHORT TERM GOAL #2   Title  Patient will be independent with basic stretching and strengthening program     Time  3    Period  Weeks    Status  On-going      PT SHORT TERM GOAL #3   Title  Patient will demonstrate full bilateral trunk rotation without self report of tightness     Time  3    Period  Weeks    Status  On-going        PT Long Term Goals - 01/20/18 1508      PT LONG TERM GOAL #1   Title  Patient will demonstrate a 24% limiation on FOTO     Time  6    Period  Weeks    Status  New    Target Date  03/03/18      PT LONG TERM GOAL #2   Title  Patient will sit for 1 hour at work with out self report of tightness in the hip     Time  6    Period  Weeks    Status  New    Target Date  03/03/18      PT LONG TERM GOAL #3   Title  Patient will report no snapping in her anterior hip in any position in order to perfrom ADL's     Time  6    Period  Weeks    Status  New    Target Date  03/03/18            Plan - 01/30/18 1606    Clinical Impression Statement  Patient given updated HEP. She know has standing and sittin g exercises to perfrom. She  was advised if she continues to have no pain she can cancel her next appointments., Ifshe has pain she can come back. We will review exercises and perfrom manual therapy on the patient.     Clinical Presentation  Evolving    Clinical Decision Making  Moderate    PT Frequency  2x / week    PT Duration  6 weeks    PT Treatment/Interventions  ADLs/Self Care Home Management;Cryotherapy;Ultrasound;Iontophoresis 61m/ml Dexamethasone;DME Instruction;Gait training;Stair training;Functional mobility training;Therapeutic exercise;Therapeutic activities;Neuromuscular re-education;Cognitive remediation;Patient/family education;Manual techniques;Passive range of motion;Taping    PT Next Visit Plan  if patient cones back it is likely because she is in pain. Perform manual therapy if that is the case; reveiw exercises and advance as tolerated.     PT Home Exercise Plan  thomas stretch; pirifromis stretch; hamstring stretch; bridge     Consulted and Agree with Plan of Care  Patient       Patient will benefit from skilled therapeutic intervention in order to improve the following deficits and impairments:  Pain, Decreased activity tolerance, Decreased range of motion, Increased fascial restricitons  Visit Diagnosis: Pain in left hip  Other abnormalities of gait and mobility    PHYSICAL THERAPY DISCHARGE SUMMARY  Visits from Start of Care:  2  Current functional level related to goals / functional outcomes: Significant improvement in pain    Remaining deficits: Nothing    Education / Equipment: HEP   Plan: Patient agrees to discharge.  Patient goals were met. Patient is being discharged due to being pleased with the current functional level.  ?????      Problem List Patient Active Problem List   Diagnosis Date Noted  . 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  Genital herpes simplex 06/18/2017  . Malignant neoplasm of female breast ([place]) 06/18/2017  . Mixed  hyperlipidemia 06/18/2017  . Senile osteoporosis 06/18/2017  . Vitamin D deficiency 06/18/2017    Carney Living PT DPT  01/30/2018, 4:08 PM  [facility] [address] [place], [place], [address] Phone: [phone]   Fax:  [phone]  Name: Margaret English MRN: [id number] Date of Birth: July 19, 1945

## 2018-02-04 ENCOUNTER — Ambulatory Visit: Payer: Medicare Other | Admitting: Physical Therapy

## 2018-02-10 ENCOUNTER — Encounter: Payer: Medicare Other | Admitting: Physical Therapy

## 2018-07-08 ENCOUNTER — Inpatient Hospital Stay (HOSPITAL_BASED_OUTPATIENT_CLINIC_OR_DEPARTMENT_OTHER): Payer: Medicare Other | Admitting: Hematology & Oncology

## 2018-07-08 ENCOUNTER — Other Ambulatory Visit: Payer: Self-pay

## 2018-07-08 ENCOUNTER — Inpatient Hospital Stay: Payer: Medicare Other | Attending: Hematology & Oncology

## 2018-07-08 VITALS — BP 128/66 | HR 70 | Temp 97.8°F | Resp 18 | Wt 147.5 lb

## 2018-07-08 DIAGNOSIS — C50011 Malignant neoplasm of nipple and areola, right female breast: Secondary | ICD-10-CM

## 2018-07-08 DIAGNOSIS — Z79899 Other long term (current) drug therapy: Secondary | ICD-10-CM | POA: Insufficient documentation

## 2018-07-08 DIAGNOSIS — E782 Mixed hyperlipidemia: Secondary | ICD-10-CM

## 2018-07-08 DIAGNOSIS — C50312 Malignant neoplasm of lower-inner quadrant of left female breast: Secondary | ICD-10-CM

## 2018-07-08 DIAGNOSIS — E559 Vitamin D deficiency, unspecified: Secondary | ICD-10-CM

## 2018-07-08 DIAGNOSIS — Z853 Personal history of malignant neoplasm of breast: Secondary | ICD-10-CM | POA: Insufficient documentation

## 2018-07-08 DIAGNOSIS — C50311 Malignant neoplasm of lower-inner quadrant of right female breast: Secondary | ICD-10-CM

## 2018-07-08 LAB — CMP (CANCER CENTER ONLY)
ALT: 37 U/L (ref 0–44)
AST: 26 U/L (ref 15–41)
Albumin: 4.1 g/dL (ref 3.5–5.0)
Alkaline Phosphatase: 65 U/L (ref 38–126)
Anion gap: 6 (ref 5–15)
BUN: 14 mg/dL (ref 8–23)
CO2: 33 mmol/L — ABNORMAL HIGH (ref 22–32)
Calcium: 10.1 mg/dL (ref 8.9–10.3)
Chloride: 104 mmol/L (ref 98–111)
Creatinine: 0.66 mg/dL (ref 0.44–1.00)
GFR, Est AFR Am: >60 mL/min (ref >60)
GFR, Estimated: >60 mL/min (ref >60)
Glucose, Bld: 100 mg/dL — ABNORMAL HIGH (ref 70–99)
Potassium: 4.3 mmol/L (ref 3.5–5.1)
Sodium: 143 mmol/L (ref 135–145)
Total Bilirubin: 0.7 mg/dL (ref 0.3–1.2)
Total Protein: 6.4 g/dL — ABNORMAL LOW (ref 6.5–8.1)

## 2018-07-08 LAB — LIPID PANEL
Cholesterol: 244 mg/dL — ABNORMAL HIGH (ref 0–200)
HDL: 69 mg/dL (ref >40)
LDL Cholesterol: 147 mg/dL — ABNORMAL HIGH (ref 0–99)
Total CHOL/HDL Ratio: 3.5 RATIO
Triglycerides: 138 mg/dL (ref <150)
VLDL: 28 mg/dL (ref 0–40)

## 2018-07-08 LAB — CBC WITH DIFFERENTIAL (CANCER CENTER ONLY)
Abs Immature Granulocytes: 0.01 10*3/uL (ref 0.00–0.07)
Basophils Absolute: 0.1 10*3/uL (ref 0.0–0.1)
Basophils Relative: 1 %
Eosinophils Absolute: 0.3 10*3/uL (ref 0.0–0.5)
Eosinophils Relative: 5 %
HCT: 42.6 % (ref 36.0–46.0)
Hemoglobin: 14 g/dL (ref 12.0–15.0)
Immature Granulocytes: 0 %
Lymphocytes Relative: 31 %
Lymphs Abs: 1.6 10*3/uL (ref 0.7–4.0)
MCH: 31.7 pg (ref 26.0–34.0)
MCHC: 32.9 g/dL (ref 30.0–36.0)
MCV: 96.4 fL (ref 80.0–100.0)
Monocytes Absolute: 0.4 10*3/uL (ref 0.1–1.0)
Monocytes Relative: 8 %
Neutro Abs: 2.8 10*3/uL (ref 1.7–7.7)
Neutrophils Relative %: 55 %
Platelet Count: 220 10*3/uL (ref 150–400)
RBC: 4.42 MIL/uL (ref 3.87–5.11)
RDW: 12.3 % (ref 11.5–15.5)
WBC Count: 5 10*3/uL (ref 4.0–10.5)
nRBC: 0 % (ref 0.0–0.2)

## 2018-07-08 NOTE — Progress Notes (Signed)
Hematology and Oncology Follow Up Visit  Margaret English [id number] [birth year] 73 y.o. 07/08/2018   Principle Diagnosis:  1. 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 2. History of pulmonary emboli.  Current Therapy:    Aspirin 162 mg by mouth daily     Interim History:  Ms.  Margaret English is back for follow-up.  Unfortunately, she really has suffered from the coronavirus.  She has a job of booking cruises.  Because of the coronavirus, cruises have dried up and she is not able to really work and make money.  Her husband apparently had a malignant parotid tumor on the right side of his face.  I think this was back in February or so.  He underwent surgery at [facility].  He then underwent radiation therapy.  He is doing well.  She just got back from vacation.  She and her family were down to [facility].  They had a wonderful time.  There were practicing social distancing.  Otherwise, she feels well.  She is lost weight.  This is a conscientious weight loss.  She is happy about losing weight.  She is interested to see what her lipid studies are.  She is had no cough.  She has had no change in bowel or bladder habits.  She has had no rashes.  Overall, her performance status is ECOG 0.  Medications:  Current Outpatient Medications:  .  acyclovir (ZOVIRAX) 200 MG capsule, as needed. , Disp: , Rfl:  .  acyclovir cream (ZOVIRAX) 5 %, Apply topically as needed., Disp: , Rfl:  .  alprazolam (XANAX) 2 MG tablet, Take 1 tablet (2 mg total) by mouth at bedtime as needed for sleep., Disp: 30 tablet, Rfl: 2 .  aspirin 81 MG EC tablet, Take 162 mg by mouth daily. Swallow whole., Disp: , Rfl:  .  azithromycin (ZITHROMAX Z-PAK) 250 MG tablet, Take as directed, Disp: 6 each, Rfl: 02 .  Calcium Citrate-Vitamin D (CALCIUM CITRATE + D PO), Take 2 tablets by mouth 2 (two) times daily. , Disp: , Rfl:  .  Cetirizine HCl (ZYRTEC ALLERGY  PO), Take by mouth every morning., Disp: , Rfl:  .  Cholecalciferol (VITAMIN D) 2000 units CAPS, Take 2,000 Units by mouth 2 (two) times daily. 2 or 3 tabs daily, Disp: , Rfl:  .  fluticasone (FLONASE) 50 MCG/ACT nasal spray, Place into both nostrils as needed for allergies or rhinitis., Disp: , Rfl:  .  Multiple Vitamins-Minerals (SENIOR MULTIVITAMIN PLUS PO), Take by mouth every morning., Disp: , Rfl:  .  mupirocin cream (BACTROBAN) 2 %, Apply 1 application topically 3 (three) times daily., Disp: 15 g, Rfl: 0 .  oxymetazoline (AFRIN EXTRA MOISTURIZING) 0.05 % nasal spray, Place 1 spray into both nostrils 2 (two) times daily as needed (Nosebleed)., Disp: 30 mL, Rfl: 0 .  pseudoephedrine (SUDAFED) 30 MG tablet, Take 30 mg by mouth as needed for congestion., Disp: , Rfl:  .  sodium chloride (OCEAN) 0.65 % SOLN nasal spray, Place 1 spray into both nostrils 4 (four) times daily., Disp: 15 mL, Rfl: 0  Current Facility-Administered Medications:  .  0.9 %  sodium chloride infusion, 500 mL, Intravenous, Continuous, Irene Shipper, MD  Allergies:  Allergies  Allergen Reactions  . Sulfa Antibiotics     Other reaction(s): Other Drainage and red eyes only allergic to sulfa eye drops  . Sulfonamide Derivatives  Eye drops    Past Medical History, Surgical history, Social history, and Family History were reviewed and updated.  Review of Systems: Review of Systems  All other systems reviewed and are negative.   Physical Exam:  weight is 147 lb 8 oz (66.9 kg). Her oral temperature is 97.8 F (36.6 C). Her blood pressure is 128/66 and her pulse is 70. Her respiration is 18 and oxygen saturation is 100%.   Physical Exam Vitals signs reviewed.  HENT:     Head: Normocephalic and atraumatic.  Eyes:     Pupils: Pupils are equal, round, and reactive to light.  Neck:     Musculoskeletal: Normal range of motion.  Cardiovascular:     Rate and Rhythm: Normal rate and regular rhythm.     Heart  sounds: Normal heart sounds.  Pulmonary:     Effort: Pulmonary effort is normal.     Breath sounds: Normal breath sounds.  Abdominal:     General: Bowel sounds are normal.     Palpations: Abdomen is soft.  Musculoskeletal: Normal range of motion.        General: No tenderness or deformity.     Comments: Chest wall exam shows bilateral implants.  She had mastectomies bilaterally.  No erythema or nodularity is noted on the anterior chest wall.  She has no axillary adenopathy bilaterally.  Lymphadenopathy:     Cervical: No cervical adenopathy.  Skin:    General: Skin is warm and dry.     Findings: No erythema or rash.  Neurological:     Mental Status: She is alert and oriented to person, place, and time.  Psychiatric:        Behavior: Behavior normal.        Thought Content: Thought content normal.        Judgment: Judgment normal.      Lab Results  Component Value Date   WBC 5.0 07/08/2018   HGB 14.0 07/08/2018   HCT 42.6 07/08/2018   MCV 96.4 07/08/2018   PLT 220 07/08/2018     Chemistry      Component Value Date/Time   NA 142 01/06/2018 1150   NA 147 (H) 12/18/2016 1125   NA 143 12/14/2015 0932   K 4.2 01/06/2018 1150   K 4.2 12/18/2016 1125   K 4.3 12/14/2015 0932   CL 105 01/06/2018 1150   CL 103 12/18/2016 1125   CO2 31 01/06/2018 1150   CO2 30 12/18/2016 1125   CO2 27 12/14/2015 0932   BUN 14 01/06/2018 1150   BUN 11 12/18/2016 1125   BUN 10.2 12/14/2015 0932   CREATININE 0.69 01/06/2018 1150   CREATININE 0.9 12/18/2016 1125   CREATININE 0.8 12/14/2015 0932      Component Value Date/Time   CALCIUM 9.6 01/06/2018 1150   CALCIUM 10.3 12/18/2016 1125   CALCIUM 10.1 12/14/2015 0932   ALKPHOS 59 01/06/2018 1150   ALKPHOS 73 12/18/2016 1125   ALKPHOS 75 12/14/2015 0932   AST 19 01/06/2018 1150   AST 32 12/14/2015 0932   ALT 20 01/06/2018 1150   ALT 32 12/18/2016 1125   ALT 35 12/14/2015 0932   BILITOT 0.6 01/06/2018 1150   BILITOT 0.82 12/14/2015 0932          Impression and Plan: Margaret English is 73 year old white female. She has a history of recurrent ductal carcinoma the left breast. She underwent systemic chemotherapy for this. She has been out of therapy now for over 10 years. 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                                       Everything right now looks fantastic.  She has absolutely no symptoms.  Hopefully, she will be able to get back to work again with respect to booking cruises.  I know that she typically is quite busy this time of year.  We will get her back in 6 more months.  I think this would be reasonable.  [name], MD 6/30/20201:01 PM

## 2018-07-09 ENCOUNTER — Telehealth: Payer: Self-pay | Admitting: *Deleted

## 2018-07-09 LAB — VITAMIN D 25 HYDROXY (VIT D DEFICIENCY, FRACTURES): Vit D, 25-Hydroxy: 63.4 ng/mL (ref 30.0–100.0)

## 2018-07-09 NOTE — Telephone Encounter (Addendum)
Left message on personal voice mail  ----- Message from [name], MD sent at 07/09/2018  7:16 AM EDT ----- Call - the Vit D level is great!!  The cholesterol is still quite high!!!  Please send this to her PCP.  Thanks!!! Laurey Arrow

## 2018-08-07 DIAGNOSIS — L821 Other seborrheic keratosis: Secondary | ICD-10-CM | POA: Diagnosis not present

## 2018-08-07 DIAGNOSIS — L570 Actinic keratosis: Secondary | ICD-10-CM | POA: Diagnosis not present

## 2018-08-07 DIAGNOSIS — D225 Melanocytic nevi of trunk: Secondary | ICD-10-CM | POA: Diagnosis not present

## 2018-08-07 DIAGNOSIS — Z86018 Personal history of other benign neoplasm: Secondary | ICD-10-CM | POA: Diagnosis not present

## 2018-08-07 DIAGNOSIS — L814 Other melanin hyperpigmentation: Secondary | ICD-10-CM | POA: Diagnosis not present

## 2018-08-07 DIAGNOSIS — L309 Dermatitis, unspecified: Secondary | ICD-10-CM | POA: Diagnosis not present

## 2018-08-07 DIAGNOSIS — D485 Neoplasm of uncertain behavior of skin: Secondary | ICD-10-CM | POA: Diagnosis not present

## 2019-01-07 ENCOUNTER — Encounter: Payer: Self-pay | Admitting: Hematology & Oncology

## 2019-01-07 ENCOUNTER — Inpatient Hospital Stay: Payer: Medicare Other

## 2019-01-07 ENCOUNTER — Other Ambulatory Visit: Payer: Self-pay

## 2019-01-07 ENCOUNTER — Inpatient Hospital Stay: Payer: Medicare Other | Attending: Hematology & Oncology | Admitting: Hematology & Oncology

## 2019-01-07 VITALS — BP 131/66 | HR 80 | Temp 97.3°F | Resp 17 | Wt 147.0 lb

## 2019-01-07 DIAGNOSIS — M25561 Pain in right knee: Secondary | ICD-10-CM | POA: Insufficient documentation

## 2019-01-07 DIAGNOSIS — E782 Mixed hyperlipidemia: Secondary | ICD-10-CM

## 2019-01-07 DIAGNOSIS — Z853 Personal history of malignant neoplasm of breast: Secondary | ICD-10-CM | POA: Diagnosis not present

## 2019-01-07 DIAGNOSIS — Z79899 Other long term (current) drug therapy: Secondary | ICD-10-CM | POA: Insufficient documentation

## 2019-01-07 DIAGNOSIS — Z9013 Acquired absence of bilateral breasts and nipples: Secondary | ICD-10-CM | POA: Insufficient documentation

## 2019-01-07 DIAGNOSIS — C50011 Malignant neoplasm of nipple and areola, right female breast: Secondary | ICD-10-CM

## 2019-01-07 LAB — CMP (CANCER CENTER ONLY)
ALT: 21 U/L (ref 0–44)
AST: 20 U/L (ref 15–41)
Albumin: 4.2 g/dL (ref 3.5–5.0)
Alkaline Phosphatase: 76 U/L (ref 38–126)
Anion gap: 6 (ref 5–15)
BUN: 18 mg/dL (ref 8–23)
CO2: 32 mmol/L (ref 22–32)
Calcium: 10.1 mg/dL (ref 8.9–10.3)
Chloride: 105 mmol/L (ref 98–111)
Creatinine: 0.72 mg/dL (ref 0.44–1.00)
GFR, Est AFR Am: >60 mL/min (ref >60)
GFR, Estimated: >60 mL/min (ref >60)
Glucose, Bld: 102 mg/dL — ABNORMAL HIGH (ref 70–99)
Potassium: 4.8 mmol/L (ref 3.5–5.1)
Sodium: 143 mmol/L (ref 135–145)
Total Bilirubin: 0.7 mg/dL (ref 0.3–1.2)
Total Protein: 6.8 g/dL (ref 6.5–8.1)

## 2019-01-07 LAB — CBC WITH DIFFERENTIAL (CANCER CENTER ONLY)
Abs Immature Granulocytes: 0.01 10*3/uL (ref 0.00–0.07)
Basophils Absolute: 0.1 10*3/uL (ref 0.0–0.1)
Basophils Relative: 1 %
Eosinophils Absolute: 0.9 10*3/uL — ABNORMAL HIGH (ref 0.0–0.5)
Eosinophils Relative: 13 %
HCT: 44.2 % (ref 36.0–46.0)
Hemoglobin: 14.5 g/dL (ref 12.0–15.0)
Immature Granulocytes: 0 %
Lymphocytes Relative: 30 %
Lymphs Abs: 2 10*3/uL (ref 0.7–4.0)
MCH: 30.9 pg (ref 26.0–34.0)
MCHC: 32.8 g/dL (ref 30.0–36.0)
MCV: 94.2 fL (ref 80.0–100.0)
Monocytes Absolute: 0.4 10*3/uL (ref 0.1–1.0)
Monocytes Relative: 7 %
Neutro Abs: 3.3 10*3/uL (ref 1.7–7.7)
Neutrophils Relative %: 49 %
Platelet Count: 240 10*3/uL (ref 150–400)
RBC: 4.69 MIL/uL (ref 3.87–5.11)
RDW: 11.9 % (ref 11.5–15.5)
WBC Count: 6.7 10*3/uL (ref 4.0–10.5)
nRBC: 0 % (ref 0.0–0.2)

## 2019-01-07 LAB — LIPID PANEL
Cholesterol: 249 mg/dL — ABNORMAL HIGH (ref 0–200)
HDL: 67 mg/dL (ref >40)
LDL Cholesterol: 155 mg/dL — ABNORMAL HIGH (ref 0–99)
Total CHOL/HDL Ratio: 3.7 RATIO
Triglycerides: 136 mg/dL (ref <150)
VLDL: 27 mg/dL (ref 0–40)

## 2019-01-07 NOTE — Progress Notes (Signed)
Hematology and Oncology Follow Up Visit  Margaret English [id number] 1945/12/25 73 y.o. 01/07/2019   Principle Diagnosis:  1. 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 2. History of pulmonary emboli.  Current Therapy:    Aspirin 162 mg by mouth daily     Interim History:  [name] is back for follow-up.  Overall, Margaret English seems to be doing fairly well.  Margaret English is thankfully, busy at work.  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  Margaret English and her husband will be going down to [place] in January.  Her concern that Margaret English is having pain in her right knee.  It sounds like this probably is going to be some degenerative changes.  I will see about making referral to Dr. Rhona Raider of [facility] and see if he can help her out.  Margaret English has had no problems with fever.  There is no cough or shortness of breath.  Margaret English has had no change in bowel or bladder habits.  Margaret English has had no headache.      Margaret English has had no rashes.  Overall, her performance status is ECOG 0.  Medications:  Current Outpatient Medications:  Marland Kitchen  UNABLE TO FIND, Take by mouth daily. Membrasin ([place]), Disp: , Rfl:  .  acyclovir (ZOVIRAX) 200 MG capsule, as needed. , Disp: , Rfl:  .  acyclovir cream (ZOVIRAX) 5 %, Apply topically as needed., Disp: , Rfl:  .  alprazolam (XANAX) 2 MG tablet, Take 1 tablet (2 mg total) by mouth at bedtime as needed for sleep., Disp: 30 tablet, Rfl: 2 .  aspirin 81 MG EC tablet, Take 162 mg by mouth daily. Swallow whole., Disp: , Rfl:  .  azithromycin (ZITHROMAX Z-PAK) 250 MG tablet, Take as directed, Disp: 6 each, Rfl: 02 .  Calcium Citrate-Vitamin D (CALCIUM CITRATE + D PO), Take 2 tablets by mouth 2 (two) times daily. , Disp: , Rfl:  .  Cetirizine HCl (ZYRTEC ALLERGY PO), Take by mouth every morning., Disp: , Rfl:  .  Cholecalciferol (VITAMIN D) 2000 units CAPS, Take 2,000 Units by mouth 2 (two) times daily. 2 or 3 tabs  daily, Disp: , Rfl:  .  fluticasone (FLONASE) 50 MCG/ACT nasal spray, Place into both nostrils as needed for allergies or rhinitis., Disp: , Rfl:  .  Multiple Vitamins-Minerals (SENIOR MULTIVITAMIN PLUS PO), Take by mouth every morning., Disp: , Rfl:  .  mupirocin cream (BACTROBAN) 2 %, Apply 1 application topically 3 (three) times daily., Disp: 15 g, Rfl: 0 .  oxymetazoline (AFRIN EXTRA MOISTURIZING) 0.05 % nasal spray, Place 1 spray into both nostrils 2 (two) times daily as needed (Nosebleed)., Disp: 30 mL, Rfl: 0 .  pseudoephedrine (SUDAFED) 30 MG tablet, Take 30 mg by mouth as needed for congestion., Disp: , Rfl:  .  sodium chloride (OCEAN) 0.65 % SOLN nasal spray, Place 1 spray into both nostrils 4 (four) times daily., Disp: 15 mL, Rfl: 0  Current Facility-Administered Medications:  .  0.9 %  sodium chloride infusion, 500 mL, Intravenous, Continuous, Irene Shipper, MD  Allergies:  Allergies  Allergen Reactions  . Sulfa Antibiotics     Other reaction(s): Other Drainage and red eyes only allergic to sulfa eye drops  . Sulfonamide Derivatives     Eye drops    Past Medical History, Surgical history, Social history, and Family History were reviewed and updated.  Review of  Systems: Review of Systems  All other systems reviewed and are negative.   Physical Exam:  weight is 147 lb (66.7 kg). Her temporal temperature is 97.3 F (36.3 C) (abnormal). Her blood pressure is 131/66 and her pulse is 80. Her respiration is 17 and oxygen saturation is 98%.   Physical Exam Vitals reviewed.  HENT:     Head: Normocephalic and atraumatic.  Eyes:     Pupils: Pupils are equal, round, and reactive to light.  Cardiovascular:     Rate and Rhythm: Normal rate and regular rhythm.     Heart sounds: Normal heart sounds.  Pulmonary:     Effort: Pulmonary effort is normal.     Breath sounds: Normal breath sounds.  Abdominal:     General: Bowel sounds are normal.     Palpations: Abdomen is soft.    Musculoskeletal:        General: No tenderness or deformity. Normal range of motion.     Cervical back: Normal range of motion.     Comments: Chest wall exam shows bilateral implants.  Margaret English had mastectomies bilaterally.  No erythema or nodularity is noted on the anterior chest wall.  Margaret English has no axillary adenopathy bilaterally.  Lymphadenopathy:     Cervical: No cervical adenopathy.  Skin:    General: Skin is warm and dry.     Findings: No erythema or rash.  Neurological:     Mental Status: Margaret English is alert and oriented to person, place, and time.  Psychiatric:        Behavior: Behavior normal.        Thought Content: Thought content normal.        Judgment: Judgment normal.      Lab Results  Component Value Date   WBC 6.7 01/07/2019   HGB 14.5 01/07/2019   HCT 44.2 01/07/2019   MCV 94.2 01/07/2019   PLT 240 01/07/2019     Chemistry      Component Value Date/Time   NA 143 01/07/2019 1133   NA 147 (H) 12/18/2016 1125   NA 143 12/14/2015 0932   K 4.8 01/07/2019 1133   K 4.2 12/18/2016 1125   K 4.3 12/14/2015 0932   CL 105 01/07/2019 1133   CL 103 12/18/2016 1125   CO2 32 01/07/2019 1133   CO2 30 12/18/2016 1125   CO2 27 12/14/2015 0932   BUN 18 01/07/2019 1133   BUN 11 12/18/2016 1125   BUN 10.2 12/14/2015 0932   CREATININE 0.72 01/07/2019 1133   CREATININE 0.9 12/18/2016 1125   CREATININE 0.8 12/14/2015 0932      Component Value Date/Time   CALCIUM 10.1 01/07/2019 1133   CALCIUM 10.3 12/18/2016 1125   CALCIUM 10.1 12/14/2015 0932   ALKPHOS 76 01/07/2019 1133   ALKPHOS 73 12/18/2016 1125   ALKPHOS 75 12/14/2015 0932   AST 20 01/07/2019 1133   AST 32 12/14/2015 0932   ALT 21 01/07/2019 1133   ALT 32 12/18/2016 1125   ALT 35 12/14/2015 0932   BILITOT 0.7 01/07/2019 1133   BILITOT 0.82 12/14/2015 0932         Impression and Plan: Margaret English is 73 year old white female. Margaret English has a history of recurrent ductal carcinoma the left breast. Margaret English underwent systemic  chemotherapy for this. Margaret English has been out of therapy now for over 10 years. 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  Everything right now looks fantastic.  Margaret English has absolutely no symptoms.  We will try to help her with her right knee.  If I need her for any of my travel plans, I will certainly call her.  We will get her back in 6 more months.  I think this would be reasonable.  [name], MD 12/30/20202:14 PM

## 2019-01-15 ENCOUNTER — Other Ambulatory Visit: Payer: Self-pay | Admitting: Family Medicine

## 2019-01-15 DIAGNOSIS — M810 Age-related osteoporosis without current pathological fracture: Secondary | ICD-10-CM

## 2019-02-17 ENCOUNTER — Ambulatory Visit: Payer: Medicare Other | Attending: Internal Medicine

## 2019-02-17 DIAGNOSIS — Z23 Encounter for immunization: Secondary | ICD-10-CM | POA: Insufficient documentation

## 2019-02-17 NOTE — Progress Notes (Signed)
   Covid-19 Vaccination Clinic  Name:  Margaret English    MRN: [id number] DOB: 06-06-1945  02/17/2019  [name] was observed post Covid-19 immunization for 15 minutes without incidence. She was provided with Vaccine Information Sheet and instruction to access the V-Safe system.   [name] was instructed to call 911 with any severe reactions post vaccine: Marland Kitchen Difficulty breathing  . Swelling of your face and throat  . A fast heartbeat  . A bad rash all over your body  . Dizziness and weakness    Immunizations Administered    Name Date Dose VIS Date Route   Pfizer COVID-19 Vaccine 02/17/2019  2:11 PM 0.3 mL 12/19/2018 Intramuscular   Manufacturer: [place]   Lot: [id number]   [place]: [id number]

## 2019-02-19 ENCOUNTER — Ambulatory Visit: Payer: Medicare Other

## 2019-03-16 ENCOUNTER — Ambulatory Visit: Payer: Medicare Other | Attending: Internal Medicine

## 2019-03-16 DIAGNOSIS — Z23 Encounter for immunization: Secondary | ICD-10-CM | POA: Insufficient documentation

## 2019-03-16 NOTE — Progress Notes (Signed)
   Covid-19 Vaccination Clinic  Name:  KRISTIONA SAVELL    MRN: [id number] DOB: 06/19/1945  03/16/2019  Ms. Dragoo was observed post Covid-19 immunization for 15 minutes without incident. She was provided with Vaccine Information Sheet and instruction to access the V-Safe system.   Ms. Bladow was instructed to call 911 with any severe reactions post vaccine: Marland Kitchen Difficulty breathing  . Swelling of face and throat  . A fast heartbeat  . A bad rash all over body  . Dizziness and weakness   Immunizations Administered    Name Date Dose VIS Date Route   Pfizer COVID-19 Vaccine 03/16/2019 10:13 AM 0.3 mL 12/19/2018 Intramuscular   Manufacturer: [place]   Lot: [id number]   [place]: [id number]

## 2019-04-02 ENCOUNTER — Other Ambulatory Visit: Payer: Self-pay

## 2019-04-02 ENCOUNTER — Ambulatory Visit
Admission: RE | Admit: 2019-04-02 | Discharge: 2019-04-02 | Disposition: A | Discharge status: Home / Self Care | Payer: Medicare Other | Source: Ambulatory Visit | Attending: Family Medicine | Admitting: Family Medicine

## 2019-04-02 DIAGNOSIS — M810 Age-related osteoporosis without current pathological fracture: Secondary | ICD-10-CM

## 2019-07-09 ENCOUNTER — Inpatient Hospital Stay: Payer: Medicare Other | Attending: Hematology & Oncology

## 2019-07-09 ENCOUNTER — Inpatient Hospital Stay (HOSPITAL_BASED_OUTPATIENT_CLINIC_OR_DEPARTMENT_OTHER): Payer: Medicare Other | Admitting: Hematology & Oncology

## 2019-07-09 ENCOUNTER — Other Ambulatory Visit: Payer: Self-pay

## 2019-07-09 ENCOUNTER — Encounter: Payer: Self-pay | Admitting: Hematology & Oncology

## 2019-07-09 VITALS — BP 125/65 | HR 86 | Temp 97.2°F | Resp 18 | Ht 66.0 in | Wt 154.5 lb

## 2019-07-09 DIAGNOSIS — M810 Age-related osteoporosis without current pathological fracture: Secondary | ICD-10-CM

## 2019-07-09 DIAGNOSIS — Z853 Personal history of malignant neoplasm of breast: Secondary | ICD-10-CM | POA: Diagnosis present

## 2019-07-09 DIAGNOSIS — C50011 Malignant neoplasm of nipple and areola, right female breast: Secondary | ICD-10-CM

## 2019-07-09 DIAGNOSIS — Z9221 Personal history of antineoplastic chemotherapy: Secondary | ICD-10-CM | POA: Insufficient documentation

## 2019-07-09 LAB — CBC WITH DIFFERENTIAL (CANCER CENTER ONLY)
Abs Immature Granulocytes: 0.01 10*3/uL (ref 0.00–0.07)
Basophils Absolute: 0.1 10*3/uL (ref 0.0–0.1)
Basophils Relative: 1 %
Eosinophils Absolute: 0.3 10*3/uL (ref 0.0–0.5)
Eosinophils Relative: 5 %
HCT: 42.5 % (ref 36.0–46.0)
Hemoglobin: 14 g/dL (ref 12.0–15.0)
Immature Granulocytes: 0 %
Lymphocytes Relative: 38 %
Lymphs Abs: 2 10*3/uL (ref 0.7–4.0)
MCH: 31.3 pg (ref 26.0–34.0)
MCHC: 32.9 g/dL (ref 30.0–36.0)
MCV: 95.1 fL (ref 80.0–100.0)
Monocytes Absolute: 0.5 10*3/uL (ref 0.1–1.0)
Monocytes Relative: 10 %
Neutro Abs: 2.4 10*3/uL (ref 1.7–7.7)
Neutrophils Relative %: 46 %
Platelet Count: 222 10*3/uL (ref 150–400)
RBC: 4.47 MIL/uL (ref 3.87–5.11)
RDW: 11.9 % (ref 11.5–15.5)
WBC Count: 5.3 10*3/uL (ref 4.0–10.5)
nRBC: 0 % (ref 0.0–0.2)

## 2019-07-09 LAB — CMP (CANCER CENTER ONLY)
ALT: 17 U/L (ref 0–44)
AST: 19 U/L (ref 15–41)
Albumin: 4.5 g/dL (ref 3.5–5.0)
Alkaline Phosphatase: 67 U/L (ref 38–126)
Anion gap: 5 (ref 5–15)
BUN: 20 mg/dL (ref 8–23)
CO2: 33 mmol/L — ABNORMAL HIGH (ref 22–32)
Calcium: 10.3 mg/dL (ref 8.9–10.3)
Chloride: 104 mmol/L (ref 98–111)
Creatinine: 0.72 mg/dL (ref 0.44–1.00)
GFR, Est AFR Am: >60 mL/min (ref >60)
GFR, Estimated: >60 mL/min (ref >60)
Glucose, Bld: 89 mg/dL (ref 70–99)
Potassium: 4.7 mmol/L (ref 3.5–5.1)
Sodium: 142 mmol/L (ref 135–145)
Total Bilirubin: 0.6 mg/dL (ref 0.3–1.2)
Total Protein: 6.8 g/dL (ref 6.5–8.1)

## 2019-07-09 NOTE — Progress Notes (Signed)
Hematology and Oncology Follow Up Visit  Margaret English [id number] 1945-01-29 74 y.o. 07/09/2019   Principle Diagnosis:  1. 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 2. History of pulmonary emboli.  Current Therapy:    Aspirin 162 mg by mouth daily     Interim History:  Ms.  Margaret English is back for follow-up.  Overall, she seems to be doing fairly well.  As always, she is quite active.  She and her husband will be going on a cruise.  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  She did see Dr. Rhona Raider  of orthopedic surgery only last saw her.  Her right knee is doing much better right now.  She did not need any interventions.  She has had no problems with the coronavirus.  She had her vaccine back in March.  She has had no change in bowel or bladder habits.  She has had no rashes.  There has been no leg swelling.  She has had no fatigue or weakness.  There is been no bleeding.  Overall, her performance status is ECOG 0   Medications:  Current Outpatient Medications:  .  acyclovir (ZOVIRAX) 200 MG capsule, as needed. , Disp: , Rfl:  .  acyclovir cream (ZOVIRAX) 5 %, Apply topically as needed., Disp: , Rfl:  .  alprazolam (XANAX) 2 MG tablet, Take 1 tablet (2 mg total) by mouth at bedtime as needed for sleep., Disp: 30 tablet, Rfl: 2 .  aspirin 81 MG EC tablet, Take 162 mg by mouth daily. Swallow whole., Disp: , Rfl:  .  azithromycin (ZITHROMAX Z-PAK) 250 MG tablet, Take as directed, Disp: 6 each, Rfl: 02 .  Calcium Citrate-Vitamin D (CALCIUM CITRATE + D PO), Take 2 tablets by mouth 2 (two) times daily. , Disp: , Rfl:  .  Cetirizine HCl (ZYRTEC ALLERGY PO), Take by mouth every morning., Disp: , Rfl:  .  Cholecalciferol (VITAMIN D) 2000 units CAPS, Take 2,000 Units by mouth 2 (two) times daily. 2 or 3 tabs daily, Disp: , Rfl:  .  fluticasone  (FLONASE) 50 MCG/ACT nasal spray, Place into both nostrils as needed for allergies or rhinitis., Disp: , Rfl:  .  Multiple Vitamins-Minerals (SENIOR MULTIVITAMIN PLUS PO), Take by mouth every morning., Disp: , Rfl:  .  mupirocin cream (BACTROBAN) 2 %, Apply 1 application topically 3 (three) times daily., Disp: 15 g, Rfl: 0 .  oxymetazoline (AFRIN EXTRA MOISTURIZING) 0.05 % nasal spray, Place 1 spray into both nostrils 2 (two) times daily as needed (Nosebleed)., Disp: 30 mL, Rfl: 0 .  pseudoephedrine (SUDAFED) 30 MG tablet, Take 30 mg by mouth as needed for congestion., Disp: , Rfl:  .  sodium chloride (OCEAN) 0.65 % SOLN nasal spray, Place 1 spray into both nostrils 4 (four) times daily., Disp: 15 mL, Rfl: 0 .  UNABLE TO FIND, Take by mouth daily. Membrasin ([place]), Disp: , Rfl:   Current Facility-Administered Medications:  .  0.9 %  sodium chloride infusion, 500 mL, Intravenous, Continuous, Irene Shipper, MD  Allergies:  Allergies  Allergen Reactions  . Sulfa Antibiotics     Other reaction(s): Other Drainage and red eyes only allergic to sulfa eye drops  . Sulfonamide Derivatives     Eye drops    Past Medical History, Surgical history, Social history,  and Family History were reviewed and updated.  Review of Systems: Review of Systems  All other systems reviewed and are negative.   Physical Exam:  vitals were not taken for this visit.   Physical Exam Vitals reviewed.  HENT:     Head: Normocephalic and atraumatic.  Eyes:     Pupils: Pupils are equal, round, and reactive to light.  Cardiovascular:     Rate and Rhythm: Normal rate and regular rhythm.     Heart sounds: Normal heart sounds.  Pulmonary:     Effort: Pulmonary effort is normal.     Breath sounds: Normal breath sounds.  Abdominal:     General: Bowel sounds are normal.     Palpations: Abdomen is soft.  Musculoskeletal:        General: No tenderness or deformity. Normal range of motion.     Cervical back:  Normal range of motion.     Comments: Chest wall exam shows bilateral implants.  She had mastectomies bilaterally.  No erythema or nodularity is noted on the anterior chest wall.  She has no axillary adenopathy bilaterally.  Lymphadenopathy:     Cervical: No cervical adenopathy.  Skin:    General: Skin is warm and dry.     Findings: No erythema or rash.  Neurological:     Mental Status: She is alert and oriented to person, place, and time.  Psychiatric:        Behavior: Behavior normal.        Thought Content: Thought content normal.        Judgment: Judgment normal.      Lab Results  Component Value Date   WBC 5.3 07/09/2019   HGB 14.0 07/09/2019   HCT 42.5 07/09/2019   MCV 95.1 07/09/2019   PLT 222 07/09/2019     Chemistry      Component Value Date/Time   NA 142 07/09/2019 1144   NA 147 (H) 12/18/2016 1125   NA 143 12/14/2015 0932   K 4.7 07/09/2019 1144   K 4.2 12/18/2016 1125   K 4.3 12/14/2015 0932   CL 104 07/09/2019 1144   CL 103 12/18/2016 1125   CO2 33 (H) 07/09/2019 1144   CO2 30 12/18/2016 1125   CO2 27 12/14/2015 0932   BUN 20 07/09/2019 1144   BUN 11 12/18/2016 1125   BUN 10.2 12/14/2015 0932   CREATININE 0.72 07/09/2019 1144   CREATININE 0.9 12/18/2016 1125   CREATININE 0.8 12/14/2015 0932      Component Value Date/Time   CALCIUM 10.3 07/09/2019 1144   CALCIUM 10.3 12/18/2016 1125   CALCIUM 10.1 12/14/2015 0932   ALKPHOS 67 07/09/2019 1144   ALKPHOS 73 12/18/2016 1125   ALKPHOS 75 12/14/2015 0932   AST 19 07/09/2019 1144   AST 32 12/14/2015 0932   ALT 17 07/09/2019 1144   ALT 32 12/18/2016 1125   ALT 35 12/14/2015 0932   BILITOT 0.6 07/09/2019 1144   BILITOT 0.82 12/14/2015 0932         Impression and Plan: Margaret English is 74 year old white female. She has a history of recurrent ductal carcinoma the left breast. She underwent systemic chemotherapy for this. She has been out of therapy now for over 10 years. Of note, her tumor was triple  negative.  Everything right now looks fantastic.  She has absolutely no symptoms.  We will get her back in 6 more months.  I think this would be reasonable.  [name], MD 7/1/20211:37 PM

## 2019-07-14 LAB — CANCER ANTIGEN 27.29: CA 27.29: 26.7 U/mL (ref 0.0–38.6)

## 2019-11-12 ENCOUNTER — Ambulatory Visit: Payer: Medicare Other | Attending: Internal Medicine

## 2019-11-12 DIAGNOSIS — Z23 Encounter for immunization: Secondary | ICD-10-CM

## 2019-11-12 NOTE — Progress Notes (Signed)
   Covid-19 Vaccination Clinic  Name:  Margaret English    MRN: [id number] DOB: [birth year]  11/12/2019  Margaret English was observed post Covid-19 immunization for 15 minutes without incident. She was provided with Vaccine Information Sheet and instruction to access the V-Safe system.   Margaret English was instructed to call 911 with any severe reactions post vaccine: Marland Kitchen Difficulty breathing  . Swelling of face and throat  . A fast heartbeat  . A bad rash all over body  . Dizziness and weakness

## 2020-01-11 DIAGNOSIS — U071 COVID-19: Secondary | ICD-10-CM | POA: Diagnosis not present

## 2020-01-14 ENCOUNTER — Inpatient Hospital Stay: Payer: Medicare Other

## 2020-01-14 ENCOUNTER — Inpatient Hospital Stay: Payer: Medicare Other | Attending: Hematology & Oncology | Admitting: Hematology & Oncology

## 2020-01-20 ENCOUNTER — Telehealth: Payer: Self-pay

## 2020-01-20 NOTE — Telephone Encounter (Signed)
Pt called in to r/s her no show appt    aom

## 2020-02-12 DIAGNOSIS — Z20822 Contact with and (suspected) exposure to covid-19: Secondary | ICD-10-CM | POA: Diagnosis not present

## 2020-02-12 DIAGNOSIS — Z03818 Encounter for observation for suspected exposure to other biological agents ruled out: Secondary | ICD-10-CM | POA: Diagnosis not present

## 2020-03-08 ENCOUNTER — Other Ambulatory Visit: Payer: Self-pay

## 2020-03-08 ENCOUNTER — Inpatient Hospital Stay: Payer: Medicare Other | Attending: Hematology & Oncology

## 2020-03-08 ENCOUNTER — Inpatient Hospital Stay: Payer: Medicare Other | Admitting: Hematology & Oncology

## 2020-03-08 ENCOUNTER — Encounter: Payer: Self-pay | Admitting: Hematology & Oncology

## 2020-03-08 VITALS — BP 140/73 | HR 85 | Temp 98.3°F | Resp 18 | Wt 153.0 lb

## 2020-03-08 DIAGNOSIS — Z86711 Personal history of pulmonary embolism: Secondary | ICD-10-CM | POA: Insufficient documentation

## 2020-03-08 DIAGNOSIS — Z9221 Personal history of antineoplastic chemotherapy: Secondary | ICD-10-CM | POA: Diagnosis not present

## 2020-03-08 DIAGNOSIS — C50011 Malignant neoplasm of nipple and areola, right female breast: Secondary | ICD-10-CM | POA: Diagnosis not present

## 2020-03-08 DIAGNOSIS — Z853 Personal history of malignant neoplasm of breast: Secondary | ICD-10-CM | POA: Insufficient documentation

## 2020-03-08 DIAGNOSIS — M810 Age-related osteoporosis without current pathological fracture: Secondary | ICD-10-CM

## 2020-03-08 LAB — CBC WITH DIFFERENTIAL (CANCER CENTER ONLY)
Abs Immature Granulocytes: 0.01 10*3/uL (ref 0.00–0.07)
Basophils Absolute: 0.1 10*3/uL (ref 0.0–0.1)
Basophils Relative: 1 %
Eosinophils Absolute: 0.2 10*3/uL (ref 0.0–0.5)
Eosinophils Relative: 4 %
HCT: 40.5 % (ref 36.0–46.0)
Hemoglobin: 13.5 g/dL (ref 12.0–15.0)
Immature Granulocytes: 0 %
Lymphocytes Relative: 30 %
Lymphs Abs: 1.4 10*3/uL (ref 0.7–4.0)
MCH: 31.8 pg (ref 26.0–34.0)
MCHC: 33.3 g/dL (ref 30.0–36.0)
MCV: 95.3 fL (ref 80.0–100.0)
Monocytes Absolute: 0.5 10*3/uL (ref 0.1–1.0)
Monocytes Relative: 10 %
Neutro Abs: 2.6 10*3/uL (ref 1.7–7.7)
Neutrophils Relative %: 55 %
Platelet Count: 219 10*3/uL (ref 150–400)
RBC: 4.25 MIL/uL (ref 3.87–5.11)
RDW: 12.4 % (ref 11.5–15.5)
WBC Count: 4.7 10*3/uL (ref 4.0–10.5)
nRBC: 0 % (ref 0.0–0.2)

## 2020-03-08 LAB — CMP (CANCER CENTER ONLY)
ALT: 22 U/L (ref 0–44)
AST: 19 U/L (ref 15–41)
Albumin: 4.2 g/dL (ref 3.5–5.0)
Alkaline Phosphatase: 64 U/L (ref 38–126)
Anion gap: 4 — ABNORMAL LOW (ref 5–15)
BUN: 15 mg/dL (ref 8–23)
CO2: 33 mmol/L — ABNORMAL HIGH (ref 22–32)
Calcium: 10.1 mg/dL (ref 8.9–10.3)
Chloride: 106 mmol/L (ref 98–111)
Creatinine: 0.69 mg/dL (ref 0.44–1.00)
GFR, Estimated: >60 mL/min (ref >60)
Glucose, Bld: 89 mg/dL (ref 70–99)
Potassium: 4.4 mmol/L (ref 3.5–5.1)
Sodium: 143 mmol/L (ref 135–145)
Total Bilirubin: 0.5 mg/dL (ref 0.3–1.2)
Total Protein: 6.5 g/dL (ref 6.5–8.1)

## 2020-03-08 LAB — VITAMIN D 25 HYDROXY (VIT D DEFICIENCY, FRACTURES): Vit D, 25-Hydroxy: 64.77 ng/mL (ref 30–100)

## 2020-03-08 NOTE — Progress Notes (Signed)
Hematology and Oncology Follow Up Visit  Margaret English [id number] [birth year] 75 y.o. 03/08/2020   Principle Diagnosis:  1. 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 2. History of pulmonary emboli.  Current Therapy:    Aspirin 162 mg by mouth daily     Interim History:  [name] is back for follow-up.  Currently, she is doing quite nicely.  She just got back from a cruise.  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  She enjoys this.  The big cruise that she will be going on will be in September when she goes over to the [place].  The big news also is that a grandson is actually a model for [employer].  She show me pictures of some of his photo shoots.  This is really exciting.  She has done well with her health.  She has not had the coronavirus.  She has been very active.  She turns 75 on Monday.  I am sure that this will be a big birthday for her.  She has had no bleeding.  There is been no change in bowel or bladder habits.  She has had no leg swelling.  She has had no nausea or vomiting.  Thankfully, her right knee seems to be doing a lot better.  Currently, I would say performance status is ECOG 0.    Medications:  Current Outpatient Medications:  .  alprazolam (XANAX) 2 MG tablet, Take 1 tablet (2 mg total) by mouth at bedtime as needed for sleep., Disp: 30 tablet, Rfl: 2 .  aspirin 81 MG EC tablet, Take 162 mg by mouth daily. Swallow whole., Disp: , Rfl:  .  calcium carbonate (OSCAL) 1500 (600 Ca) MG TABS tablet, 1 tablet with meals, Disp: , Rfl:  .  Calcium Citrate-Vitamin D (CALCIUM CITRATE + D PO), Take 2 tablets by mouth 2 (two) times daily. , Disp: , Rfl:  .  Cetirizine HCl (ZYRTEC ALLERGY PO), Take by mouth every morning., Disp: , Rfl:  .  Cholecalciferol (VITAMIN D) 2000 units CAPS, Take 2,000 Units by mouth 2 (two) times daily. 2 or 3 tabs daily, Disp: , Rfl:  .  ELDERBERRY  PO, , Disp: , Rfl:  .  fluticasone (FLONASE) 50 MCG/ACT nasal spray, Place into both nostrils as needed for allergies or rhinitis., Disp: , Rfl:  .  Multiple Vitamins-Minerals (SENIOR MULTIVITAMIN PLUS PO), Take by mouth every morning., Disp: , Rfl:  .  oxymetazoline (AFRIN EXTRA MOISTURIZING) 0.05 % nasal spray, Place 1 spray into both nostrils 2 (two) times daily as needed (Nosebleed)., Disp: 30 mL, Rfl: 0 .  UNABLE TO FIND, Take by mouth daily. Membrasin ([place]), Disp: , Rfl:  .  acyclovir (ZOVIRAX) 200 MG capsule, as needed.  (Patient not taking: Reported on 03/08/2020), Disp: , Rfl:  .  acyclovir cream (ZOVIRAX) 5 %, Apply topically as needed. (Patient not taking: Reported on 03/08/2020), Disp: , Rfl:  .  azithromycin (ZITHROMAX Z-PAK) 250 MG tablet, Take as directed (Patient not taking: Reported on 03/08/2020), Disp: 6 each, Rfl: 02 .  Cholecalciferol 50 MCG (2000 UT) CAPS, 2 capsule, Disp: , Rfl:  .  mupirocin cream (BACTROBAN) 2 %, Apply 1 application topically 3 (three) times daily. (Patient not taking: Reported on 03/08/2020), Disp: 15 g, Rfl: 0 .  pseudoephedrine (SUDAFED) 30 MG tablet, Take 30 mg by mouth  as needed for congestion. (Patient not taking: Reported on 03/08/2020), Disp: , Rfl:  .  sodium chloride (OCEAN) 0.65 % SOLN nasal spray, Place 1 spray into both nostrils 4 (four) times daily., Disp: 15 mL, Rfl: 0  Current Facility-Administered Medications:  .  0.9 %  sodium chloride infusion, 500 mL, Intravenous, Continuous, Irene Shipper, MD  Allergies:  Allergies  Allergen Reactions  . Sulfa Antibiotics Other (See Comments)    Drainage and red eyes only allergic to sulfa eye drops  . Sulfonamide Derivatives Other (See Comments)    Eye drops only  . [facility] Astringent Eye Drops [Tetrahydrozoline-Zn Sulfate] Other (See Comments)    Past Medical History, Surgical history, Social history, and Family History were reviewed and updated.  Review of Systems: Review of Systems  All other  systems reviewed and are negative.   Physical Exam:  weight is 153 lb (69.4 kg). Her oral temperature is 98.3 F (36.8 C). Her blood pressure is 140/73 and her pulse is 85. Her respiration is 18 and oxygen saturation is 97%.   Physical Exam Vitals reviewed.  HENT:     Head: Normocephalic and atraumatic.  Eyes:     Pupils: Pupils are equal, round, and reactive to light.  Cardiovascular:     Rate and Rhythm: Normal rate and regular rhythm.     Heart sounds: Normal heart sounds.  Pulmonary:     Effort: Pulmonary effort is normal.     Breath sounds: Normal breath sounds.  Abdominal:     General: Bowel sounds are normal.     Palpations: Abdomen is soft.  Musculoskeletal:        General: No tenderness or deformity. Normal range of motion.     Cervical back: Normal range of motion.     Comments: Chest wall exam shows bilateral implants.  She had mastectomies bilaterally.  No erythema or nodularity is noted on the anterior chest wall.  She has no axillary adenopathy bilaterally.  Lymphadenopathy:     Cervical: No cervical adenopathy.  Skin:    General: Skin is warm and dry.     Findings: No erythema or rash.  Neurological:     Mental Status: She is alert and oriented to person, place, and time.  Psychiatric:        Behavior: Behavior normal.        Thought Content: Thought content normal.        Judgment: Judgment normal.      Lab Results  Component Value Date   WBC 4.7 03/08/2020   HGB 13.5 03/08/2020   HCT 40.5 03/08/2020   MCV 95.3 03/08/2020   PLT 219 03/08/2020     Chemistry      Component Value Date/Time   NA 143 03/08/2020 0936   NA 147 (H) 12/18/2016 1125   NA 143 12/14/2015 0932   K 4.4 03/08/2020 0936   K 4.2 12/18/2016 1125   K 4.3 12/14/2015 0932   CL 106 03/08/2020 0936   CL 103 12/18/2016 1125   CO2 33 (H) 03/08/2020 0936   CO2 30 12/18/2016 1125   CO2 27 12/14/2015 0932   BUN 15 03/08/2020 0936   BUN 11 12/18/2016 1125   BUN 10.2 12/14/2015 0932    CREATININE 0.69 03/08/2020 0936   CREATININE 0.9 12/18/2016 1125   CREATININE 0.8 12/14/2015 0932      Component Value Date/Time   CALCIUM 10.1 03/08/2020 0936   CALCIUM 10.3 12/18/2016 1125   CALCIUM 10.1 12/14/2015 0932  ALKPHOS 64 03/08/2020 0936   ALKPHOS 73 12/18/2016 1125   ALKPHOS 75 12/14/2015 0932   AST 19 03/08/2020 0936   AST 32 12/14/2015 0932   ALT 22 03/08/2020 0936   ALT 32 12/18/2016 1125   ALT 35 12/14/2015 0932   BILITOT 0.5 03/08/2020 0936   BILITOT 0.82 12/14/2015 0932      Impression and Plan: Ms. Spicer is 75 year old white female. She has a history of recurrent ductal carcinoma the left breast. She underwent systemic chemotherapy for this. She has been out of therapy now for over 11 years. 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  As such, I have to believe that she is to be cured.  I am just happy that her quality of life is doing so well right now.  She really is doing nicely.  She is very active.  We will plan for another follow-up in 6 months.  Hopefully will get her back after she gets back from her [place] cruise.                                      [name], MD 3/1/202210:53 AM

## 2020-06-03 ENCOUNTER — Other Ambulatory Visit (HOSPITAL_COMMUNITY): Payer: Self-pay | Admitting: Home Modifications

## 2020-06-03 DIAGNOSIS — L729 Follicular cyst of the skin and subcutaneous tissue, unspecified: Secondary | ICD-10-CM

## 2020-06-03 DIAGNOSIS — G4700 Insomnia, unspecified: Secondary | ICD-10-CM | POA: Diagnosis not present

## 2020-06-15 ENCOUNTER — Ambulatory Visit (HOSPITAL_BASED_OUTPATIENT_CLINIC_OR_DEPARTMENT_OTHER)
Admission: RE | Admit: 2020-06-15 | Discharge: 2020-06-15 | Disposition: A | Discharge status: Home / Self Care | Payer: Medicare Other | Source: Ambulatory Visit | Attending: Home Modifications | Admitting: Home Modifications

## 2020-06-15 ENCOUNTER — Other Ambulatory Visit: Payer: Self-pay

## 2020-06-15 DIAGNOSIS — L729 Follicular cyst of the skin and subcutaneous tissue, unspecified: Secondary | ICD-10-CM | POA: Insufficient documentation

## 2020-06-15 DIAGNOSIS — R2231 Localized swelling, mass and lump, right upper limb: Secondary | ICD-10-CM | POA: Diagnosis not present

## 2020-06-16 ENCOUNTER — Telehealth: Payer: Self-pay | Admitting: *Deleted

## 2020-06-16 MED ORDER — AZITHROMYCIN 250 MG PO TABS: ORAL_TABLET | ORAL | 0 refills | Status: DC

## 2020-06-16 NOTE — Telephone Encounter (Signed)
Returned call to pt regarding recent scan. Pt requesting MD advice on Scan and zpak for upcoming trip to the [place].  Upon call back I was unable to reach pt. Lmovm stating pt to contact ordering physicians office for results. Zpak sent to [facility].

## 2020-06-17 ENCOUNTER — Ambulatory Visit (HOSPITAL_BASED_OUTPATIENT_CLINIC_OR_DEPARTMENT_OTHER): Payer: Medicare Other

## 2020-06-22 DIAGNOSIS — M7989 Other specified soft tissue disorders: Secondary | ICD-10-CM | POA: Diagnosis not present

## 2020-07-15 DIAGNOSIS — M62830 Muscle spasm of back: Secondary | ICD-10-CM | POA: Diagnosis not present

## 2020-07-26 DIAGNOSIS — S22000A Wedge compression fracture of unspecified thoracic vertebra, initial encounter for closed fracture: Secondary | ICD-10-CM | POA: Diagnosis not present

## 2020-07-26 DIAGNOSIS — M5459 Other low back pain: Secondary | ICD-10-CM | POA: Diagnosis not present

## 2020-07-27 ENCOUNTER — Other Ambulatory Visit: Payer: Self-pay | Admitting: Orthopedic Surgery

## 2020-07-27 DIAGNOSIS — M545 Low back pain, unspecified: Secondary | ICD-10-CM

## 2020-07-29 DIAGNOSIS — M545 Low back pain, unspecified: Secondary | ICD-10-CM | POA: Diagnosis not present

## 2020-08-02 DIAGNOSIS — M545 Low back pain, unspecified: Secondary | ICD-10-CM | POA: Diagnosis not present

## 2020-08-26 DIAGNOSIS — M25562 Pain in left knee: Secondary | ICD-10-CM | POA: Diagnosis not present

## 2020-08-29 DIAGNOSIS — L821 Other seborrheic keratosis: Secondary | ICD-10-CM | POA: Diagnosis not present

## 2020-08-29 DIAGNOSIS — L578 Other skin changes due to chronic exposure to nonionizing radiation: Secondary | ICD-10-CM | POA: Diagnosis not present

## 2020-08-29 DIAGNOSIS — L820 Inflamed seborrheic keratosis: Secondary | ICD-10-CM | POA: Diagnosis not present

## 2020-08-29 DIAGNOSIS — Z86018 Personal history of other benign neoplasm: Secondary | ICD-10-CM | POA: Diagnosis not present

## 2020-08-29 DIAGNOSIS — D225 Melanocytic nevi of trunk: Secondary | ICD-10-CM | POA: Diagnosis not present

## 2020-08-29 DIAGNOSIS — D485 Neoplasm of uncertain behavior of skin: Secondary | ICD-10-CM | POA: Diagnosis not present

## 2020-08-29 DIAGNOSIS — L814 Other melanin hyperpigmentation: Secondary | ICD-10-CM | POA: Diagnosis not present

## 2020-08-29 DIAGNOSIS — L570 Actinic keratosis: Secondary | ICD-10-CM | POA: Diagnosis not present

## 2020-09-06 DIAGNOSIS — M25551 Pain in right hip: Secondary | ICD-10-CM | POA: Diagnosis not present

## 2020-09-06 DIAGNOSIS — M545 Low back pain, unspecified: Secondary | ICD-10-CM | POA: Diagnosis not present

## 2020-09-08 DIAGNOSIS — M545 Low back pain, unspecified: Secondary | ICD-10-CM | POA: Diagnosis not present

## 2020-09-08 DIAGNOSIS — M25551 Pain in right hip: Secondary | ICD-10-CM | POA: Diagnosis not present

## 2020-09-13 DIAGNOSIS — M545 Low back pain, unspecified: Secondary | ICD-10-CM | POA: Diagnosis not present

## 2020-10-12 ENCOUNTER — Inpatient Hospital Stay: Payer: Medicare Other | Admitting: Hematology & Oncology

## 2020-10-12 ENCOUNTER — Inpatient Hospital Stay: Payer: Medicare Other | Attending: Hematology & Oncology

## 2020-10-12 ENCOUNTER — Encounter: Payer: Self-pay | Admitting: Hematology & Oncology

## 2020-10-12 ENCOUNTER — Other Ambulatory Visit: Payer: Self-pay

## 2020-10-12 VITALS — BP 136/71 | HR 71 | Temp 98.5°F | Resp 16 | Wt 154.0 lb

## 2020-10-12 DIAGNOSIS — Z853 Personal history of malignant neoplasm of breast: Secondary | ICD-10-CM | POA: Insufficient documentation

## 2020-10-12 DIAGNOSIS — Z9221 Personal history of antineoplastic chemotherapy: Secondary | ICD-10-CM | POA: Diagnosis not present

## 2020-10-12 DIAGNOSIS — C50011 Malignant neoplasm of nipple and areola, right female breast: Secondary | ICD-10-CM

## 2020-10-12 LAB — CBC WITH DIFFERENTIAL (CANCER CENTER ONLY)
Abs Immature Granulocytes: 0.01 10*3/uL (ref 0.00–0.07)
Basophils Absolute: 0.1 10*3/uL (ref 0.0–0.1)
Basophils Relative: 1 %
Eosinophils Absolute: 0.3 10*3/uL (ref 0.0–0.5)
Eosinophils Relative: 7 %
HCT: 43.9 % (ref 36.0–46.0)
Hemoglobin: 14.7 g/dL (ref 12.0–15.0)
Immature Granulocytes: 0 %
Lymphocytes Relative: 32 %
Lymphs Abs: 1.5 10*3/uL (ref 0.7–4.0)
MCH: 31.5 pg (ref 26.0–34.0)
MCHC: 33.5 g/dL (ref 30.0–36.0)
MCV: 94 fL (ref 80.0–100.0)
Monocytes Absolute: 0.4 10*3/uL (ref 0.1–1.0)
Monocytes Relative: 9 %
Neutro Abs: 2.4 10*3/uL (ref 1.7–7.7)
Neutrophils Relative %: 51 %
Platelet Count: 270 10*3/uL (ref 150–400)
RBC: 4.67 MIL/uL (ref 3.87–5.11)
RDW: 12.3 % (ref 11.5–15.5)
WBC Count: 4.6 10*3/uL (ref 4.0–10.5)
nRBC: 0 % (ref 0.0–0.2)

## 2020-10-12 LAB — CMP (CANCER CENTER ONLY)
ALT: 23 U/L (ref 0–44)
AST: 19 U/L (ref 15–41)
Albumin: 4.3 g/dL (ref 3.5–5.0)
Alkaline Phosphatase: 78 U/L (ref 38–126)
Anion gap: 7 (ref 5–15)
BUN: 17 mg/dL (ref 8–23)
CO2: 31 mmol/L (ref 22–32)
Calcium: 9.6 mg/dL (ref 8.9–10.3)
Chloride: 105 mmol/L (ref 98–111)
Creatinine: 0.67 mg/dL (ref 0.44–1.00)
GFR, Estimated: >60 mL/min (ref >60)
Glucose, Bld: 95 mg/dL (ref 70–99)
Potassium: 4.4 mmol/L (ref 3.5–5.1)
Sodium: 143 mmol/L (ref 135–145)
Total Bilirubin: 0.7 mg/dL (ref 0.3–1.2)
Total Protein: 6.9 g/dL (ref 6.5–8.1)

## 2020-10-12 NOTE — Progress Notes (Signed)
Hematology and Oncology Follow Up Visit  Margaret English [id number] [birth year] 75 y.o. 10/12/2020   Principle Diagnosis:  1. 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 2. History of pulmonary emboli.  Current Therapy:   Aspirin 162 mg by mouth daily     Interim History:  [name] is back for follow-up.  As expected, she just got back from a cruise.  She was over in [place].  She went to [place] first.  Then she went on a cruise to the [place].  She had a wonderful time.  She did develop a compression fracture of her lower back.  I suspect this probably was at T12 or L1.  She is getting better.  She has not had any intervention for this right now.  She is taking vitamin D.  She is doing walking.  She also walked a lot on her vacation.  She is to go down to the [place] I think in December.  Otherwise, she is doing quite nicely.  She always travels.  I think last time that we saw her, she may have been on her way to [place].  I think this where she may develop the compression fracture.  I think she was carrying some luggage around.  She has had no problems with bleeding.  There is no change in bowel or bladder habits.  She has had no fever.  Thankfully, there is been no problems with COVID.  She has had no headache.  She has had no leg swelling.  Overall, her performance status is ECOG 0.    Medications:  Current Outpatient Medications:    acyclovir (ZOVIRAX) 200 MG capsule, as needed.  (Patient not taking: Reported on 03/08/2020), Disp: , Rfl:    acyclovir cream (ZOVIRAX) 5 %, Apply topically as needed. (Patient not taking: Reported on 03/08/2020), Disp: , Rfl:    alprazolam (XANAX) 2 MG tablet, Take 1 tablet (2 mg total) by mouth at bedtime as needed for sleep., Disp: 30 tablet, Rfl: 2   aspirin 81 MG EC tablet, Take 162 mg by mouth daily. Swallow whole., Disp: , Rfl:    calcitonin, salmon, (MIACALCIN/FORTICAL) 200 UNIT/ACT nasal  spray, Place 1 spray into alternate nostrils daily., Disp: , Rfl:    calcium carbonate (OSCAL) 1500 (600 Ca) MG TABS tablet, 1 tablet with meals, Disp: , Rfl:    Calcium Citrate-Vitamin D (CALCIUM CITRATE + D PO), Take 2 tablets by mouth 2 (two) times daily. , Disp: , Rfl:    Cetirizine HCl (ZYRTEC ALLERGY PO), Take by mouth every morning., Disp: , Rfl:    Cholecalciferol (VITAMIN D) 2000 units CAPS, Take 2,000 Units by mouth 2 (two) times daily. 2 or 3 tabs daily, Disp: , Rfl:    Cholecalciferol 50 MCG (2000 UT) CAPS, 2 capsule, Disp: , Rfl:    ELDERBERRY PO, , Disp: , Rfl:    fluticasone (FLONASE) 50 MCG/ACT nasal spray, Place into both nostrils as needed for allergies or rhinitis., Disp: , Rfl:    Multiple Vitamins-Minerals (SENIOR MULTIVITAMIN PLUS PO), Take by mouth every morning., Disp: , Rfl:    mupirocin cream (BACTROBAN) 2 %, Apply 1 application topically 3 (three) times daily. (Patient not taking: Reported on 03/08/2020), Disp: 15 g, Rfl: 0   oxymetazoline (AFRIN EXTRA MOISTURIZING) 0.05 % nasal spray, Place 1 spray into both nostrils 2 (two) times daily as needed (Nosebleed)., Disp: 30 mL, Rfl: 0   pseudoephedrine (  SUDAFED) 30 MG tablet, Take 30 mg by mouth as needed for congestion. (Patient not taking: Reported on 03/08/2020), Disp: , Rfl:    sodium chloride (OCEAN) 0.65 % SOLN nasal spray, Place 1 spray into both nostrils 4 (four) times daily., Disp: 15 mL, Rfl: 0   UNABLE TO FIND, Take by mouth daily. Membrasin ([place]), Disp: , Rfl:   Current Facility-Administered Medications:    0.9 %  sodium chloride infusion, 500 mL, Intravenous, Continuous, Irene Shipper, MD  Allergies:  Allergies  Allergen Reactions   Sulfa Antibiotics Other (See Comments)    Drainage and red eyes only allergic to sulfa eye drops   Sulfonamide Derivatives Other (See Comments)    Eye drops only   [facility] Astringent Eye Drops [Tetrahydrozoline-Zn Sulfate] Other (See Comments)    Past Medical History,  Surgical history, Social history, and Family History were reviewed and updated.  Review of Systems: Review of Systems  Musculoskeletal:  Positive for back pain.  All other systems reviewed and are negative.  Physical Exam:  weight is 154 lb (69.9 kg). Her oral temperature is 98.5 F (36.9 C). Her blood pressure is 136/71 and her pulse is 71. Her respiration is 16 and oxygen saturation is 96%.   Physical Exam Vitals reviewed.  HENT:     Head: Normocephalic and atraumatic.  Eyes:     Pupils: Pupils are equal, round, and reactive to light.  Cardiovascular:     Rate and Rhythm: Normal rate and regular rhythm.     Heart sounds: Normal heart sounds.  Pulmonary:     Effort: Pulmonary effort is normal.     Breath sounds: Normal breath sounds.  Abdominal:     General: Bowel sounds are normal.     Palpations: Abdomen is soft.  Musculoskeletal:        General: No tenderness or deformity. Normal range of motion.     Cervical back: Normal range of motion.     Comments: Chest wall exam shows bilateral implants.  She had mastectomies bilaterally.  No erythema or nodularity is noted on the anterior chest wall.  She has no axillary adenopathy bilaterally.  Lymphadenopathy:     Cervical: No cervical adenopathy.  Skin:    General: Skin is warm and dry.     Findings: No erythema or rash.  Neurological:     Mental Status: She is alert and oriented to person, place, and time.  Psychiatric:        Behavior: Behavior normal.        Thought Content: Thought content normal.        Judgment: Judgment normal.     Lab Results  Component Value Date   WBC 4.6 10/12/2020   HGB 14.7 10/12/2020   HCT 43.9 10/12/2020   MCV 94.0 10/12/2020   PLT 270 10/12/2020     Chemistry      Component Value Date/Time   NA 143 10/12/2020 0935   NA 147 (H) 12/18/2016 1125   NA 143 12/14/2015 0932   K 4.4 10/12/2020 0935   K 4.2 12/18/2016 1125   K 4.3 12/14/2015 0932   CL 105 10/12/2020 0935   CL 103  12/18/2016 1125   CO2 31 10/12/2020 0935   CO2 30 12/18/2016 1125   CO2 27 12/14/2015 0932   BUN 17 10/12/2020 0935   BUN 11 12/18/2016 1125   BUN 10.2 12/14/2015 0932   CREATININE 0.67 10/12/2020 0935   CREATININE 0.9 12/18/2016 1125   CREATININE  0.8 12/14/2015 0932      Component Value Date/Time   CALCIUM 9.6 10/12/2020 0935   CALCIUM 10.3 12/18/2016 1125   CALCIUM 10.1 12/14/2015 0932   ALKPHOS 78 10/12/2020 0935   ALKPHOS 73 12/18/2016 1125   ALKPHOS 75 12/14/2015 0932   AST 19 10/12/2020 0935   AST 32 12/14/2015 0932   ALT 23 10/12/2020 0935   ALT 32 12/18/2016 1125   ALT 35 12/14/2015 0932   BILITOT 0.7 10/12/2020 0935   BILITOT 0.82 12/14/2015 0932      Impression and Plan: Margaret English is 75 year old white female. She has a history of recurrent ductal carcinoma the left breast. She underwent systemic chemotherapy for this. She has been out of therapy now for over 12 years. 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  As such, I have to believe that she is to be cured.  Again, she is incredibly active.  She travels quite a bit.  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  I am glad that this compression fracture is improving.  Sound like she is doing well on her own.  She is going go back and talk to the doctor about whether or not she needs any type of bone hardener.  We will still plan for 20-month follow-up.  I still do not see any reason that we have to do any scans on her.                                     [name], MD 10/5/202210:41 AM

## 2020-10-13 LAB — CANCER ANTIGEN 27.29: CA 27.29: 16.3 U/mL (ref 0.0–38.6)

## 2020-11-10 DIAGNOSIS — L139 Bullous disorder, unspecified: Secondary | ICD-10-CM | POA: Diagnosis not present

## 2020-11-14 DIAGNOSIS — H2513 Age-related nuclear cataract, bilateral: Secondary | ICD-10-CM | POA: Diagnosis not present

## 2020-11-28 DIAGNOSIS — L139 Bullous disorder, unspecified: Secondary | ICD-10-CM | POA: Diagnosis not present

## 2020-11-28 DIAGNOSIS — L570 Actinic keratosis: Secondary | ICD-10-CM | POA: Diagnosis not present

## 2020-12-13 DIAGNOSIS — M545 Low back pain, unspecified: Secondary | ICD-10-CM | POA: Diagnosis not present

## 2020-12-26 DIAGNOSIS — Z0000 Encounter for general adult medical examination without abnormal findings: Secondary | ICD-10-CM | POA: Diagnosis not present

## 2020-12-26 DIAGNOSIS — K219 Gastro-esophageal reflux disease without esophagitis: Secondary | ICD-10-CM | POA: Diagnosis not present

## 2020-12-26 DIAGNOSIS — E673 Hypervitaminosis D: Secondary | ICD-10-CM | POA: Diagnosis not present

## 2020-12-26 DIAGNOSIS — S22080S Wedge compression fracture of T11-T12 vertebra, sequela: Secondary | ICD-10-CM | POA: Diagnosis not present

## 2020-12-26 DIAGNOSIS — Z23 Encounter for immunization: Secondary | ICD-10-CM | POA: Diagnosis not present

## 2020-12-26 DIAGNOSIS — G4700 Insomnia, unspecified: Secondary | ICD-10-CM | POA: Diagnosis not present

## 2020-12-26 DIAGNOSIS — M810 Age-related osteoporosis without current pathological fracture: Secondary | ICD-10-CM | POA: Diagnosis not present

## 2020-12-26 DIAGNOSIS — Z131 Encounter for screening for diabetes mellitus: Secondary | ICD-10-CM | POA: Diagnosis not present

## 2020-12-26 DIAGNOSIS — E782 Mixed hyperlipidemia: Secondary | ICD-10-CM | POA: Diagnosis not present

## 2021-01-25 ENCOUNTER — Other Ambulatory Visit: Payer: Self-pay | Admitting: Family Medicine

## 2021-01-25 DIAGNOSIS — M810 Age-related osteoporosis without current pathological fracture: Secondary | ICD-10-CM

## 2021-03-13 ENCOUNTER — Encounter: Payer: Self-pay | Admitting: Internal Medicine

## 2021-04-12 ENCOUNTER — Other Ambulatory Visit: Payer: Medicare Other

## 2021-04-12 ENCOUNTER — Ambulatory Visit: Payer: Medicare Other | Admitting: Hematology & Oncology

## 2021-04-13 ENCOUNTER — Ambulatory Visit (AMBULATORY_SURGERY_CENTER): Payer: Medicare Other | Admitting: *Deleted

## 2021-04-13 ENCOUNTER — Other Ambulatory Visit: Payer: Self-pay

## 2021-04-13 ENCOUNTER — Inpatient Hospital Stay: Payer: Medicare Other | Attending: Hematology & Oncology

## 2021-04-13 ENCOUNTER — Encounter: Payer: Self-pay | Admitting: Hematology & Oncology

## 2021-04-13 ENCOUNTER — Inpatient Hospital Stay: Payer: Medicare Other | Admitting: Hematology & Oncology

## 2021-04-13 ENCOUNTER — Other Ambulatory Visit: Payer: Self-pay | Admitting: Internal Medicine

## 2021-04-13 VITALS — Ht 66.0 in | Wt 156.0 lb

## 2021-04-13 VITALS — BP 129/73 | HR 80 | Temp 98.4°F | Resp 18 | Ht 66.14 in | Wt 156.0 lb

## 2021-04-13 DIAGNOSIS — Z860100 Personal history of colon polyps, unspecified: Secondary | ICD-10-CM

## 2021-04-13 DIAGNOSIS — C50011 Malignant neoplasm of nipple and areola, right female breast: Secondary | ICD-10-CM

## 2021-04-13 DIAGNOSIS — Z9221 Personal history of antineoplastic chemotherapy: Secondary | ICD-10-CM | POA: Diagnosis not present

## 2021-04-13 DIAGNOSIS — Z853 Personal history of malignant neoplasm of breast: Secondary | ICD-10-CM | POA: Insufficient documentation

## 2021-04-13 LAB — CMP (CANCER CENTER ONLY)
ALT: 14 U/L (ref 0–44)
AST: 17 U/L (ref 15–41)
Albumin: 4 g/dL (ref 3.5–5.0)
Alkaline Phosphatase: 65 U/L (ref 38–126)
Anion gap: 7 (ref 5–15)
BUN: 14 mg/dL (ref 8–23)
CO2: 30 mmol/L (ref 22–32)
Calcium: 9.6 mg/dL (ref 8.9–10.3)
Chloride: 106 mmol/L (ref 98–111)
Creatinine: 0.74 mg/dL (ref 0.44–1.00)
GFR, Estimated: >60 mL/min (ref >60)
Glucose, Bld: 94 mg/dL (ref 70–99)
Potassium: 4 mmol/L (ref 3.5–5.1)
Sodium: 143 mmol/L (ref 135–145)
Total Bilirubin: 0.7 mg/dL (ref 0.3–1.2)
Total Protein: 6.4 g/dL — ABNORMAL LOW (ref 6.5–8.1)

## 2021-04-13 LAB — CBC WITH DIFFERENTIAL (CANCER CENTER ONLY)
Abs Immature Granulocytes: 0 10*3/uL (ref 0.00–0.07)
Basophils Absolute: 0.1 10*3/uL (ref 0.0–0.1)
Basophils Relative: 2 %
Eosinophils Absolute: 0.2 10*3/uL (ref 0.0–0.5)
Eosinophils Relative: 6 %
HCT: 43.4 % (ref 36.0–46.0)
Hemoglobin: 14.5 g/dL (ref 12.0–15.0)
Immature Granulocytes: 0 %
Lymphocytes Relative: 38 %
Lymphs Abs: 1.5 10*3/uL (ref 0.7–4.0)
MCH: 31.7 pg (ref 26.0–34.0)
MCHC: 33.4 g/dL (ref 30.0–36.0)
MCV: 95 fL (ref 80.0–100.0)
Monocytes Absolute: 0.3 10*3/uL (ref 0.1–1.0)
Monocytes Relative: 7 %
Neutro Abs: 1.8 10*3/uL (ref 1.7–7.7)
Neutrophils Relative %: 47 %
Platelet Count: 218 10*3/uL (ref 150–400)
RBC: 4.57 MIL/uL (ref 3.87–5.11)
RDW: 11.9 % (ref 11.5–15.5)
WBC Count: 3.8 10*3/uL — ABNORMAL LOW (ref 4.0–10.5)
nRBC: 0 % (ref 0.0–0.2)

## 2021-04-13 LAB — LACTATE DEHYDROGENASE: LDH: 116 U/L (ref 98–192)

## 2021-04-13 MED ORDER — PLENVU 140 G PO SOLR: 1.0000 | Freq: Once | ORAL | 0 refills | Status: AC

## 2021-04-13 NOTE — Progress Notes (Signed)
?Hematology and Oncology Follow Up Visit ? ?Margaret English ?[id number] ?06-24-1945 76 y.o. ?04/13/2021 ? ? ?Principle Diagnosis:  ?1. History of recurrent infiltrating ductal carcinoma of the left ?    breast -- clinical remission. ?2. History of pulmonary emboli. ? ?Current Therapy:   ?Aspirin 162 mg by mouth daily ?    ?Interim History:  [name] is back for follow-up.  She is doing quite well.  She really has no specific complaints.  She does have a compression fracture in her back at T12.  She had a massage yesterday.  Unfortunately, this hurt her the next day.  Is been quite painful in that area.  I am sure she probably has some muscle spasms. ? ?She, as always, goes on a trip to [place] for about a month over the winter.  She had a wonderful time down there. ? 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  She has been incredibly busy she says. ? ?She has had no issues with fever.  There is been no problems with COVID.  She has had no issues with bowels or bladder.  There is been no leg swelling.  She has had no headache. ? ?Overall, her performance status is ECOG 1.   ? ?Medications:  ?Current Outpatient Medications:  ?  acyclovir cream (ZOVIRAX) 5 %, Apply topically as needed., Disp: , Rfl:  ?  alprazolam (XANAX) 2 MG tablet, Take 1 tablet (2 mg total) by mouth at bedtime as needed for sleep., Disp: 30 tablet, Rfl: 2 ?  aspirin 81 MG EC tablet, Take 162 mg by mouth daily. Swallow whole., Disp: , Rfl:  ?  calcitonin, salmon, (MIACALCIN/FORTICAL) 200 UNIT/ACT nasal spray, Place 1 spray into alternate nostrils daily., Disp: , Rfl:  ?  calcium carbonate (OSCAL) 1500 (600 Ca) MG TABS tablet, 1 tablet with meals, Disp: , Rfl:  ?  Calcium Citrate-Vitamin D (CALCIUM CITRATE + D PO), Take 2 tablets by mouth 2 (two) times daily. , Disp: , Rfl:  ?  celecoxib (CELEBREX) 200 MG capsule, Take 200 mg by mouth 2 (two) times daily., Disp: , Rfl:  ?  Cetirizine HCl (ZYRTEC ALLERGY PO), Take by mouth every morning., Disp: ,  Rfl:  ?  Cholecalciferol (VITAMIN D) 2000 units CAPS, Take 2,000 Units by mouth 2 (two) times daily. 2 or 3 tabs daily, Disp: , Rfl:  ?  Cholecalciferol 50 MCG (2000 UT) CAPS, 2 capsule, Disp: , Rfl:  ?  ELDERBERRY PO, , Disp: , Rfl:  ?  fluticasone (FLONASE) 50 MCG/ACT nasal spray, Place into both nostrils as needed for allergies or rhinitis., Disp: , Rfl:  ?  Multiple Vitamins-Minerals (SENIOR MULTIVITAMIN PLUS PO), Take by mouth every morning., Disp: , Rfl:  ?  UNABLE TO FIND, Take by mouth daily. Membrasin ([place]), Disp: , Rfl:  ?  oxymetazoline (AFRIN EXTRA MOISTURIZING) 0.05 % nasal spray, Place 1 spray into both nostrils 2 (two) times daily as needed (Nosebleed). (Patient not taking: Reported on 04/13/2021), Disp: 30 mL, Rfl: 0 ?  pseudoephedrine (SUDAFED) 30 MG tablet, Take 30 mg by mouth as needed for congestion. (Patient not taking: Reported on 03/08/2020), Disp: , Rfl:  ?  sodium chloride (OCEAN) 0.65 % SOLN nasal spray, Place 1 spray into both nostrils 4 (four) times daily., Disp: 15 mL, Rfl: 0 ? ?Current Facility-Administered Medications:  ?  0.9 %  sodium chloride infusion, 500 mL, Intravenous, Continuous, Margaret Shipper, MD ? ?Allergies:  ?Allergies  ?Allergen  Reactions  ? Sulfa Antibiotics Other (See Comments)  ?  Drainage and red eyes only allergic to sulfa eye drops  ? Sulfonamide Derivatives Other (See Comments)  ?  Eye drops only  ? [facility] Astringent Eye Drops [Tetrahydrozoline-Zn Sulfate] Other (See Comments)  ? ? ?Past Medical History, Surgical history, Social history, and Family History were reviewed and updated. ? ?Review of Systems: ?Review of Systems  ?Musculoskeletal:  Positive for back pain.  ?All other systems reviewed and are negative. ? ?Physical Exam: ? height is 5' 6.14" (1.68 m) and weight is 156 lb 0.6 oz (70.8 kg). Her oral temperature is 98.4 ?F (36.9 ?C). Her blood pressure is 129/73 and her pulse is 80. Her respiration is 18 and oxygen saturation is 98%.  ? ?Physical  Exam ?Vitals reviewed.  ?HENT:  ?   Head: Normocephalic and atraumatic.  ?Eyes:  ?   Pupils: Pupils are equal, round, and reactive to light.  ?Cardiovascular:  ?   Rate and Rhythm: Normal rate and regular rhythm.  ?   Heart sounds: Normal heart sounds.  ?Pulmonary:  ?   Effort: Pulmonary effort is normal.  ?   Breath sounds: Normal breath sounds.  ?Abdominal:  ?   General: Bowel sounds are normal.  ?   Palpations: Abdomen is soft.  ?Musculoskeletal:     ?   General: No tenderness or deformity. Normal range of motion.  ?   Cervical back: Normal range of motion.  ?   Comments: Chest wall exam shows bilateral implants.  She had mastectomies bilaterally.  No erythema or nodularity is noted on the anterior chest wall.  She has no axillary adenopathy bilaterally.  ?Lymphadenopathy:  ?   Cervical: No cervical adenopathy.  ?Skin: ?   General: Skin is warm and dry.  ?   Findings: No erythema or rash.  ?Neurological:  ?   Mental Status: She is alert and oriented to person, place, and time.  ?Psychiatric:     ?   Behavior: Behavior normal.     ?   Thought Content: Thought content normal.     ?   Judgment: Judgment normal.  ? ? ? ?Lab Results  ?Component Value Date  ? WBC 3.8 (L) 04/13/2021  ? HGB 14.5 04/13/2021  ? HCT 43.4 04/13/2021  ? MCV 95.0 04/13/2021  ? PLT 218 04/13/2021  ? ?  Chemistry   ?   ?Component Value Date/Time  ? NA 143 04/13/2021 0937  ? NA 147 (H) 12/18/2016 1125  ? NA 143 12/14/2015 0932  ? K 4.0 04/13/2021 0937  ? K 4.2 12/18/2016 1125  ? K 4.3 12/14/2015 0932  ? CL 106 04/13/2021 0937  ? CL 103 12/18/2016 1125  ? CO2 30 04/13/2021 0937  ? CO2 30 12/18/2016 1125  ? CO2 27 12/14/2015 0932  ? BUN 14 04/13/2021 0937  ? BUN 11 12/18/2016 1125  ? BUN 10.2 12/14/2015 0932  ? CREATININE 0.74 04/13/2021 0937  ? CREATININE 0.9 12/18/2016 1125  ? CREATININE 0.8 12/14/2015 0932  ?    ?Component Value Date/Time  ? CALCIUM 9.6 04/13/2021 0937  ? CALCIUM 10.3 12/18/2016 1125  ? CALCIUM 10.1 12/14/2015 0932  ? ALKPHOS 65  04/13/2021 0937  ? ALKPHOS 73 12/18/2016 1125  ? ALKPHOS 75 12/14/2015 0932  ? AST 17 04/13/2021 0937  ? AST 32 12/14/2015 0932  ? ALT 14 04/13/2021 0937  ? ALT 32 12/18/2016 1125  ? ALT 35 12/14/2015 0932  ? BILITOT 0.7  04/13/2021 2836  ? BILITOT 0.82 12/14/2015 0932  ?  ? ? ?Impression and Plan: ?Ms. Roehr is 76 year old white female. She has a history of recurrent ductal carcinoma the left breast. She underwent systemic chemotherapy for this. She has been out of therapy now for close to 13 years. 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  As such, I have to believe that she is to be cured. ? ?Again, she is incredibly active.  She travels quite a bit.  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  She goes on cruises.  I am sure to be going on another cruise this spring or summer. ? ?Hopefully, the compression fracture will not be a problem for her. ? ?We will still plan for 66-monthfollow-up.  I still do not see any reason that we have to do any scans on her.                                   ? ? ?PVolanda Napoleon MD ?4/6/202310:31 AM ? ?

## 2021-04-13 NOTE — Progress Notes (Signed)
Patient's pre-visit was done today over the phone with the patient. Name,DOB and address verified. Patient denies any allergies to Eggs and Soy. Patient denies any problems with anesthesia/sedation. Patient is not taking any diet pills or blood thinners. No home Oxygen. Insurance confirmed with patient. ? ?Prep instructions sent to pt's MyChart-pt is aware. Patient understands to call us back with any questions or concerns. Patient is aware of our care-partner policy.  ? ?EMMI education assigned to the patient for the procedure, sent to MyChart.  ? ?The patient is COVID-19 vaccinated.   ?

## 2021-04-25 ENCOUNTER — Encounter: Payer: Self-pay | Admitting: Internal Medicine

## 2021-05-03 ENCOUNTER — Telehealth: Payer: Self-pay | Admitting: Internal Medicine

## 2021-05-03 NOTE — Telephone Encounter (Signed)
Agree.  Faythe Ghee to proceed ?

## 2021-05-03 NOTE — Telephone Encounter (Signed)
Patient has a colonoscopy scheduled for tomorrow.  She ate a salad for lunch yesterday and wanted to know if it was OK to proceed.  Please call and advise.  Thank you. ?

## 2021-05-03 NOTE — Telephone Encounter (Signed)
Returned pt's call. She states the salad was somewhat small, not as large as a chef's salad, and she did not finish all of it. Encouraged pt to drink as much fluids today as possible, even before starting her prep this evening to help flush her bowels. Instructed pt to call back if her stools were not clear by early in the morning. She verbalized understanding.

## 2021-05-04 ENCOUNTER — Encounter: Payer: Self-pay | Admitting: Internal Medicine

## 2021-05-04 ENCOUNTER — Telehealth: Payer: Self-pay | Admitting: Internal Medicine

## 2021-05-04 ENCOUNTER — Ambulatory Visit (AMBULATORY_SURGERY_CENTER): Payer: Medicare Other | Admitting: Internal Medicine

## 2021-05-04 VITALS — BP 135/83 | HR 72 | Temp 97.3°F | Resp 13 | Ht 66.0 in | Wt 156.0 lb

## 2021-05-04 DIAGNOSIS — Z860100 Personal history of colon polyps, unspecified: Secondary | ICD-10-CM

## 2021-05-04 MED ORDER — SODIUM CHLORIDE 0.9 % IV SOLN: 500.0000 mL | INTRAVENOUS | Status: DC

## 2021-05-04 NOTE — Op Note (Signed)
[place] ?Patient Name: Margaret English ?Procedure Date: 05/04/2021 11:26 AM ?MRN: [id number] ?Endoscopist: Docia Chuck. Henrene Pastor , MD ?Age: 76 ?Referring MD:  ?Date of Birth: 10-May-1945 ?Gender: Female ?Account #: [id number] ?Procedure:                Colonoscopy ?Indications:              High risk colon cancer surveillance: Personal  ?                          history of non-advanced adenomas, High risk colon  ?                          cancer surveillance: Personal history of sessile  ?                          serrated colon polyp (less than 10 mm in size) with  ?                          no dysplasia. Previous examinations 2007, 2018 ?Medicines:                Monitored Anesthesia Care ?Procedure:                Pre-Anesthesia Assessment: ?                          - Prior to the procedure, a History and Physical  ?                          was performed, and patient medications and  ?                          allergies were reviewed. The patient's tolerance of  ?                          previous anesthesia was also reviewed. The risks  ?                          and benefits of the procedure and the sedation  ?                          options and risks were discussed with the patient.  ?                          All questions were answered, and informed consent  ?                          was obtained. Prior Anticoagulants: The patient has  ?                          taken no previous anticoagulant or antiplatelet  ?                          agents. ASA Grade Assessment: II - A patient with  ?  mild systemic disease. After reviewing the risks  ?                          and benefits, the patient was deemed in  ?                          satisfactory condition to undergo the procedure. ?                          After obtaining informed consent, the colonoscope  ?                          was passed under direct vision. Throughout the  ?                          procedure, the patient's  blood pressure, pulse, and  ?                          oxygen saturations were monitored continuously. The  ?                          Olympus CF-[id number] ([id number]) Colonoscope was  ?                          introduced through the anus and advanced to the the  ?                          cecum, identified by appendiceal orifice and  ?                          ileocecal valve. The ileocecal valve, appendiceal  ?                          orifice, and rectum were photographed. The quality  ?                          of the bowel preparation was good. The colonoscopy  ?                          was performed without difficulty. The patient  ?                          tolerated the procedure well. The bowel preparation  ?                          used was Prepopik via split dose instruction. ?Scope In: 11:36:22 AM ?Scope Out: 11:55:07 AM ?Scope Withdrawal Time: 0 hours 12 minutes 58 seconds  ?Total Procedure Duration: 0 hours 18 minutes 45 seconds  ?Findings:                 Multiple small and large-mouthed diverticula were  ?                          found in the sigmoid colon and ascending colon. ?  Internal hemorrhoids were found during retroflexion. ?                          The exam was otherwise without abnormality on  ?                          direct and retroflexion views. ?Complications:            No immediate complications. Estimated blood loss:  ?                          None. ?Estimated Blood Loss:     Estimated blood loss: none. ?Impression:               - Diverticulosis in the sigmoid colon and in the  ?                          ascending colon. ?                          - The examination was otherwise normal on direct  ?                          and retroflexion views. ?                          - No specimens collected. ?Recommendation:           - Repeat colonoscopy is not recommended for  ?                          surveillance. ?                          - Patient has a  contact number available for  ?                          emergencies. The signs and symptoms of potential  ?                          delayed complications were discussed with the  ?                          patient. Return to normal activities tomorrow.  ?                          Written discharge instructions were provided to the  ?                          patient. ?                          - Resume previous diet. ?                          - Continue present medications. ?Docia Chuck. Henrene Pastor, MD ?05/04/2021 12:01:43 PM ?This report has been signed electronically. ?

## 2021-05-04 NOTE — Patient Instructions (Signed)

## 2021-05-04 NOTE — Progress Notes (Signed)
A/ox3, pleased with MAC, report to RN 

## 2021-05-04 NOTE — Telephone Encounter (Signed)
Called and spoke to pt.  Pt stated she took the second part of her prep and was only able to keep it down for about 30-40 mins.  Pt reported that she had a bowel movement about 10 minutes ago and her stool was clear liquid, green, with no solid pieces.  Spoke to Agricultural consultant.  Recommended that pt continue to drink clear fluids up until cut off time.  Patient voiced understanding. ? ? ?

## 2021-05-04 NOTE — Progress Notes (Signed)
Pt's states no medical or surgical changes since previsit or office visit. 

## 2021-05-04 NOTE — Telephone Encounter (Signed)
Inbound call from patient stating that she is scheduled to have a procedure at 11:30 with Dr. Henrene Pastor. Stated that since she has taken the second round of prep she has been vomiting. Patient is requesting a call back to discuss. Please advise.   ?

## 2021-05-04 NOTE — Progress Notes (Signed)
HISTORY OF PRESENT ILLNESS: ? ?AALYSSA English is a 76 y.o. 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  Previous colonoscopy 2007 and 2018.  She presents today for surveillance.  No active complaints.  Tolerated prep ? ?REVIEW OF SYSTEMS: ? ?All non-GI ROS negative. ?Past Medical History:  ?Diagnosis Date  ? Allergy   ? Cancer [facility]) 2009  ? Breast   ? Cataract   ? Chronic kidney disease   ? kidney stones  ? History of kidney stones   ? Neuromuscular disorder ([place])   ? 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 Osteopenia   ? Osteoporosis   ? ? ?Past Surgical History:  ?Procedure Laterality Date  ? ABDOMINAL HYSTERECTOMY    ? COLONOSCOPY  03/16/2016  ? Dr.Cydnie Deason  ? EYE SURGERY Bilateral   ? 2016- Cataracts  ? MASTECTOMY Bilateral   ? 2009  ? POLYPECTOMY    ? ? ?Social History ?Tatijana N Barritt  reports that she has never smoked. She has never used smokeless tobacco. She reports current alcohol use. She reports that she does not use drugs. ? ?family history is not on file. ? ?Allergies  ?Allergen Reactions  ? Sulfa Antibiotics Other (See Comments)  ?  Drainage and red eyes only allergic to sulfa eye drops  ? Sulfonamide Derivatives Other (See Comments)  ?  Eye drops only  ? [facility] Astringent Eye Drops [Tetrahydrozoline-Zn Sulfate] Other (See Comments)  ? ? ?  ? ?PHYSICAL EXAMINATION: ? ?Vital signs: BP 125/77   Pulse 91   Temp (!) 97.3 ?F (36.3 ?C) (Temporal)   Ht '5\' 6"'$  (1.676 m)   Wt 156 lb (70.8 kg)   SpO2 95%   BMI 25.18 kg/m?  ?General: Well-developed, well-nourished, no acute distress ?HEENT: Sclerae are anicteric, conjunctiva pink. Oral mucosa intact ?Lungs: Clear ?Heart: Regular ?Abdomen: soft, nontender, nondistended, no obvious ascites, no peritoneal signs, normal bowel sounds. No organomegaly. ?Extremities: No edema ?Psychiatric: alert and oriented x3. Cooperative  ? ? ? ?ASSESSMENT: ? ?History of adenomatous and sessile serrated polyps. ? ? ?PLAN: ? ? ?Surveillance colonoscopy ? ? ? ?   ?

## 2021-05-08 ENCOUNTER — Telehealth: Payer: Self-pay

## 2021-05-08 DIAGNOSIS — C44722 Squamous cell carcinoma of skin of right lower limb, including hip: Secondary | ICD-10-CM | POA: Diagnosis not present

## 2021-05-08 DIAGNOSIS — L821 Other seborrheic keratosis: Secondary | ICD-10-CM | POA: Diagnosis not present

## 2021-05-08 DIAGNOSIS — D485 Neoplasm of uncertain behavior of skin: Secondary | ICD-10-CM | POA: Diagnosis not present

## 2021-05-08 NOTE — Telephone Encounter (Signed)
?  Follow up Call- ? ? ?  05/04/2021  ? 10:48 AM  ?Call back number  ?Post procedure Call Back phone  # [phone]  ?Permission to leave phone message Yes  ?  ? ?Patient questions: ? ?Do you have a fever, pain , or abdominal swelling? No. ?Pain Score  0 * ? ?Have you tolerated food without any problems? Yes.   ? ?Have you been able to return to your normal activities? Yes.   ? ?Do you have any questions about your discharge instructions: ?Diet   No. ?Medications  No. ?Follow up visit  No. ? ?Do you have questions or concerns about your Care? No. ? ?Actions: ?* If pain score is 4 or above: ?No action needed, pain <4. ? ? ?

## 2021-05-23 DIAGNOSIS — M25552 Pain in left hip: Secondary | ICD-10-CM | POA: Diagnosis not present

## 2021-05-31 DIAGNOSIS — I872 Venous insufficiency (chronic) (peripheral): Secondary | ICD-10-CM | POA: Diagnosis not present

## 2021-05-31 DIAGNOSIS — C44722 Squamous cell carcinoma of skin of right lower limb, including hip: Secondary | ICD-10-CM | POA: Diagnosis not present

## 2021-06-26 ENCOUNTER — Ambulatory Visit
Admission: RE | Admit: 2021-06-26 | Discharge: 2021-06-26 | Disposition: A | Discharge status: Home / Self Care | Payer: Medicare Other | Source: Ambulatory Visit | Attending: Family Medicine | Admitting: Family Medicine

## 2021-06-26 DIAGNOSIS — M810 Age-related osteoporosis without current pathological fracture: Secondary | ICD-10-CM

## 2021-06-26 DIAGNOSIS — M8589 Other specified disorders of bone density and structure, multiple sites: Secondary | ICD-10-CM | POA: Diagnosis not present

## 2021-06-26 DIAGNOSIS — Z780 Asymptomatic menopausal state: Secondary | ICD-10-CM | POA: Diagnosis not present

## 2021-07-05 DIAGNOSIS — D171 Benign lipomatous neoplasm of skin and subcutaneous tissue of trunk: Secondary | ICD-10-CM | POA: Diagnosis not present

## 2021-08-10 DIAGNOSIS — L821 Other seborrheic keratosis: Secondary | ICD-10-CM | POA: Diagnosis not present

## 2021-08-10 DIAGNOSIS — Z08 Encounter for follow-up examination after completed treatment for malignant neoplasm: Secondary | ICD-10-CM | POA: Diagnosis not present

## 2021-08-10 DIAGNOSIS — D1801 Hemangioma of skin and subcutaneous tissue: Secondary | ICD-10-CM | POA: Diagnosis not present

## 2021-08-10 DIAGNOSIS — Z85828 Personal history of other malignant neoplasm of skin: Secondary | ICD-10-CM | POA: Diagnosis not present

## 2021-08-10 DIAGNOSIS — L814 Other melanin hyperpigmentation: Secondary | ICD-10-CM | POA: Diagnosis not present

## 2021-08-11 DIAGNOSIS — Z08 Encounter for follow-up examination after completed treatment for malignant neoplasm: Secondary | ICD-10-CM | POA: Diagnosis not present

## 2021-08-11 DIAGNOSIS — L923 Foreign body granuloma of the skin and subcutaneous tissue: Secondary | ICD-10-CM | POA: Diagnosis not present

## 2021-08-11 DIAGNOSIS — Z85828 Personal history of other malignant neoplasm of skin: Secondary | ICD-10-CM | POA: Diagnosis not present

## 2021-08-11 DIAGNOSIS — C4492 Squamous cell carcinoma of skin, unspecified: Secondary | ICD-10-CM | POA: Diagnosis not present

## 2021-08-11 DIAGNOSIS — C4491 Basal cell carcinoma of skin, unspecified: Secondary | ICD-10-CM | POA: Diagnosis not present

## 2021-10-11 ENCOUNTER — Ambulatory Visit: Payer: Medicare Other | Admitting: Hematology & Oncology

## 2021-10-11 ENCOUNTER — Inpatient Hospital Stay: Payer: Medicare Other

## 2021-10-26 ENCOUNTER — Encounter: Payer: Self-pay | Admitting: Hematology & Oncology

## 2021-10-26 ENCOUNTER — Other Ambulatory Visit: Payer: Self-pay

## 2021-10-26 ENCOUNTER — Inpatient Hospital Stay: Payer: Medicare Other | Attending: Hematology & Oncology | Admitting: Hematology & Oncology

## 2021-10-26 ENCOUNTER — Telehealth: Payer: Self-pay | Admitting: *Deleted

## 2021-10-26 ENCOUNTER — Inpatient Hospital Stay: Payer: Medicare Other

## 2021-10-26 VITALS — BP 146/78 | HR 89 | Temp 98.9°F | Resp 18 | Ht 66.0 in | Wt 150.0 lb

## 2021-10-26 DIAGNOSIS — Z853 Personal history of malignant neoplasm of breast: Secondary | ICD-10-CM | POA: Diagnosis not present

## 2021-10-26 DIAGNOSIS — C50011 Malignant neoplasm of nipple and areola, right female breast: Secondary | ICD-10-CM | POA: Diagnosis not present

## 2021-10-26 DIAGNOSIS — Z9221 Personal history of antineoplastic chemotherapy: Secondary | ICD-10-CM | POA: Diagnosis not present

## 2021-10-26 LAB — CMP (CANCER CENTER ONLY)
ALT: 15 U/L (ref 0–44)
AST: 17 U/L (ref 15–41)
Albumin: 4.4 g/dL (ref 3.5–5.0)
Alkaline Phosphatase: 74 U/L (ref 38–126)
Anion gap: 7 (ref 5–15)
BUN: 15 mg/dL (ref 8–23)
CO2: 32 mmol/L (ref 22–32)
Calcium: 10.3 mg/dL (ref 8.9–10.3)
Chloride: 104 mmol/L (ref 98–111)
Creatinine: 0.7 mg/dL (ref 0.44–1.00)
GFR, Estimated: >60 mL/min (ref >60)
Glucose, Bld: 126 mg/dL — ABNORMAL HIGH (ref 70–99)
Potassium: 4 mmol/L (ref 3.5–5.1)
Sodium: 143 mmol/L (ref 135–145)
Total Bilirubin: 0.6 mg/dL (ref 0.3–1.2)
Total Protein: 6.8 g/dL (ref 6.5–8.1)

## 2021-10-26 LAB — CBC WITH DIFFERENTIAL (CANCER CENTER ONLY)
Abs Immature Granulocytes: 0.01 10*3/uL (ref 0.00–0.07)
Basophils Absolute: 0.1 10*3/uL (ref 0.0–0.1)
Basophils Relative: 1 %
Eosinophils Absolute: 0.2 10*3/uL (ref 0.0–0.5)
Eosinophils Relative: 2 %
HCT: 44.6 % (ref 36.0–46.0)
Hemoglobin: 14.8 g/dL (ref 12.0–15.0)
Immature Granulocytes: 0 %
Lymphocytes Relative: 27 %
Lymphs Abs: 2 10*3/uL (ref 0.7–4.0)
MCH: 31.8 pg (ref 26.0–34.0)
MCHC: 33.2 g/dL (ref 30.0–36.0)
MCV: 95.9 fL (ref 80.0–100.0)
Monocytes Absolute: 0.4 10*3/uL (ref 0.1–1.0)
Monocytes Relative: 6 %
Neutro Abs: 4.7 10*3/uL (ref 1.7–7.7)
Neutrophils Relative %: 64 %
Platelet Count: 255 10*3/uL (ref 150–400)
RBC: 4.65 MIL/uL (ref 3.87–5.11)
RDW: 11.9 % (ref 11.5–15.5)
WBC Count: 7.4 10*3/uL (ref 4.0–10.5)
nRBC: 0 % (ref 0.0–0.2)

## 2021-10-26 LAB — LACTATE DEHYDROGENASE: LDH: 133 U/L (ref 98–192)

## 2021-10-26 LAB — VITAMIN D 25 HYDROXY (VIT D DEFICIENCY, FRACTURES): Vit D, 25-Hydroxy: 47.91 ng/mL (ref 30–100)

## 2021-10-26 NOTE — Progress Notes (Signed)
Hematology and Oncology Follow Up Visit  Margaret English [id number] 07/12/1945 76 y.o. 10/26/2021   Principle Diagnosis:  1. 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 2. History of pulmonary emboli.  Current Therapy:   Aspirin 162 mg by mouth daily     Interim History:  Margaret English is back for follow-up.  She is doing quite well.  She really has no specific complaints.  She, as always, is about to go on another cruise.  This is in November.  Is down to the [place].  She and her husband were at [facility].  They are there for 3 weeks.  Had a wonderful time.  They are now are taking care of of a rescue dog.  They are really doing a good job with a rescue dog.  Obviously, the dog was very abused and now the dog knows that it is loved and is becoming more friendly.  She has complained a little bit of some tingling under the left implant on the chest wall.  I am not sure exactly what this indicates.  Apparently, she has a friend who will get her in contact with a plastic surgeon and she might be able to evaluate Margaret English.  There is been no problems with COVID.  She has had no bleeding.  She has had no rashes.  There is been no leg swelling.  She has had no change in bowel or bladder habits.  She goes to the [facility] to work out.  This helps her back.  I think last time that we saw her, she had a compression fracture at T12.  Overall, I would have to say that her performance status is probably ECOG 1.    Medications:  Current Outpatient Medications:    acetaminophen (TYLENOL) 500 MG tablet, Take 500 mg by mouth every 6 (six) hours as needed., Disp: , Rfl:    acyclovir cream (ZOVIRAX) 5 %, Apply topically as needed., Disp: , Rfl:    aspirin 81 MG EC tablet, Take 162 mg by mouth daily. Swallow whole., Disp: , Rfl:    calcium carbonate (OSCAL) 1500 (600 Ca) MG TABS tablet, 1 tablet with meals, Disp: , Rfl:    Calcium Citrate-Vitamin D  (CALCIUM CITRATE + D PO), Take 2 tablets by mouth 2 (two) times daily. , Disp: , Rfl:    celecoxib (CELEBREX) 200 MG capsule, Take 200 mg by mouth as needed., Disp: , Rfl:    Cholecalciferol (VITAMIN D) 2000 units CAPS, Take 2,000 Units by mouth 2 (two) times daily. 2 or 3 tabs daily, Disp: , Rfl:    Fexofenadine HCl (ALLEGRA PO), Take 1 tablet by mouth as needed., Disp: , Rfl:    fluticasone (FLONASE) 50 MCG/ACT nasal spray, Place into both nostrils as needed for allergies or rhinitis., Disp: , Rfl:    HM LIDOCAINE PATCH EX, Apply topically., Disp: , Rfl:    Multiple Vitamins-Minerals (SENIOR MULTIVITAMIN PLUS PO), Take by mouth every morning., Disp: , Rfl:    oxymetazoline (AFRIN EXTRA MOISTURIZING) 0.05 % nasal spray, Place 1 spray into both nostrils 2 (two) times daily as needed (Nosebleed)., Disp: 30 mL, Rfl: 0   pseudoephedrine (SUDAFED) 30 MG tablet, Take 30 mg by mouth as needed for congestion., Disp: , Rfl:   Allergies:  Allergies  Allergen Reactions   Sulfa Antibiotics Other (See Comments)    Drainage and red eyes only allergic to sulfa eye drops  Sulfonamide Derivatives Other (See Comments)    Eye drops only   [facility] Astringent Eye Drops [Tetrahydrozoline-Zn Sulfate] Other (See Comments)    Past Medical History, Surgical history, Social history, and Family History were reviewed and updated.  Review of Systems: Review of Systems  Musculoskeletal:  Positive for back pain.  All other systems reviewed and are negative.   Physical Exam: Vital signs show temperature of 98.9.  Pulse 89.  Blood pressure 136/60.  Weight is 150 pounds.  Physical Exam Vitals reviewed.  HENT:     Head: Normocephalic and atraumatic.  Eyes:     Pupils: Pupils are equal, round, and reactive to light.  Cardiovascular:     Rate and Rhythm: Normal rate and regular rhythm.     Heart sounds: Normal heart sounds.  Pulmonary:     Effort: Pulmonary effort is normal.     Breath sounds: Normal breath  sounds.  Abdominal:     General: Bowel sounds are normal.     Palpations: Abdomen is soft.  Musculoskeletal:        General: No tenderness or deformity. Normal range of motion.     Cervical back: Normal range of motion.     Comments: Chest wall exam shows bilateral implants.  She had mastectomies bilaterally.  No erythema or nodularity is noted on the anterior chest wall.  She has no axillary adenopathy bilaterally.  Lymphadenopathy:     Cervical: No cervical adenopathy.  Skin:    General: Skin is warm and dry.     Findings: No erythema or rash.  Neurological:     Mental Status: She is alert and oriented to person, place, and time.  Psychiatric:        Behavior: Behavior normal.        Thought Content: Thought content normal.        Judgment: Judgment normal.      Lab Results  Component Value Date   WBC 7.4 10/26/2021   HGB 14.8 10/26/2021   HCT 44.6 10/26/2021   MCV 95.9 10/26/2021   PLT 255 10/26/2021     Chemistry      Component Value Date/Time   NA 143 10/26/2021 1507   NA 147 (H) 12/18/2016 1125   NA 143 12/14/2015 0932   K 4.0 10/26/2021 1507   K 4.2 12/18/2016 1125   K 4.3 12/14/2015 0932   CL 104 10/26/2021 1507   CL 103 12/18/2016 1125   CO2 32 10/26/2021 1507   CO2 30 12/18/2016 1125   CO2 27 12/14/2015 0932   BUN 15 10/26/2021 1507   BUN 11 12/18/2016 1125   BUN 10.2 12/14/2015 0932   CREATININE 0.70 10/26/2021 1507   CREATININE 0.9 12/18/2016 1125   CREATININE 0.8 12/14/2015 0932      Component Value Date/Time   CALCIUM 10.3 10/26/2021 1507   CALCIUM 10.3 12/18/2016 1125   CALCIUM 10.1 12/14/2015 0932   ALKPHOS 74 10/26/2021 1507   ALKPHOS 73 12/18/2016 1125   ALKPHOS 75 12/14/2015 0932   AST 17 10/26/2021 1507   AST 32 12/14/2015 0932   ALT 15 10/26/2021 1507   ALT 32 12/18/2016 1125   ALT 35 12/14/2015 0932   BILITOT 0.6 10/26/2021 1507   BILITOT 0.82 12/14/2015 0932      Impression and Plan: Margaret English is 76 year old white female.  She has a history of recurrent ductal carcinoma the left breast. She underwent systemic chemotherapy for this. She has been out of therapy now for close  to 14 years. 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  As such, I have to believe that she is to be cured.  Again, she is incredibly active.  She travels quite a bit.  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  She goes on cruises.  I am sure to be going on another cruise this spring or summer.  I really cannot find anything on exam that would suggest any count of issue.  As always, we will follow her up in 6 months.  I am sure that she will be very active and come back with a lot of stories of her trips.                                 [name], MD 10/19/20233:41 PM

## 2021-10-26 NOTE — Telephone Encounter (Signed)
Per 10/26/21 los - gave upcoming appointments - confirmed

## 2021-10-27 LAB — CANCER ANTIGEN 27.29: CA 27.29: 16.7 U/mL (ref 0.0–38.6)

## 2021-11-09 DIAGNOSIS — H02831 Dermatochalasis of right upper eyelid: Secondary | ICD-10-CM | POA: Diagnosis not present

## 2022-01-04 DIAGNOSIS — M810 Age-related osteoporosis without current pathological fracture: Secondary | ICD-10-CM | POA: Diagnosis not present

## 2022-01-04 DIAGNOSIS — E782 Mixed hyperlipidemia: Secondary | ICD-10-CM | POA: Diagnosis not present

## 2022-01-10 DIAGNOSIS — R350 Frequency of micturition: Secondary | ICD-10-CM | POA: Diagnosis not present

## 2022-01-10 DIAGNOSIS — M810 Age-related osteoporosis without current pathological fracture: Secondary | ICD-10-CM | POA: Diagnosis not present

## 2022-01-10 DIAGNOSIS — K219 Gastro-esophageal reflux disease without esophagitis: Secondary | ICD-10-CM | POA: Diagnosis not present

## 2022-01-10 DIAGNOSIS — G4700 Insomnia, unspecified: Secondary | ICD-10-CM | POA: Diagnosis not present

## 2022-01-10 DIAGNOSIS — Z0000 Encounter for general adult medical examination without abnormal findings: Secondary | ICD-10-CM | POA: Diagnosis not present

## 2022-01-10 DIAGNOSIS — Z23 Encounter for immunization: Secondary | ICD-10-CM | POA: Diagnosis not present

## 2022-01-10 DIAGNOSIS — J309 Allergic rhinitis, unspecified: Secondary | ICD-10-CM | POA: Diagnosis not present

## 2022-01-10 DIAGNOSIS — E782 Mixed hyperlipidemia: Secondary | ICD-10-CM | POA: Diagnosis not present

## 2022-01-10 DIAGNOSIS — R059 Cough, unspecified: Secondary | ICD-10-CM | POA: Diagnosis not present

## 2022-01-31 DIAGNOSIS — Z789 Other specified health status: Secondary | ICD-10-CM | POA: Diagnosis not present

## 2022-01-31 DIAGNOSIS — L820 Inflamed seborrheic keratosis: Secondary | ICD-10-CM | POA: Diagnosis not present

## 2022-01-31 DIAGNOSIS — L821 Other seborrheic keratosis: Secondary | ICD-10-CM | POA: Diagnosis not present

## 2022-01-31 DIAGNOSIS — R208 Other disturbances of skin sensation: Secondary | ICD-10-CM | POA: Diagnosis not present

## 2022-01-31 DIAGNOSIS — L538 Other specified erythematous conditions: Secondary | ICD-10-CM | POA: Diagnosis not present

## 2022-01-31 DIAGNOSIS — L298 Other pruritus: Secondary | ICD-10-CM | POA: Diagnosis not present

## 2022-02-08 DIAGNOSIS — L821 Other seborrheic keratosis: Secondary | ICD-10-CM | POA: Diagnosis not present

## 2022-04-09 DIAGNOSIS — N390 Urinary tract infection, site not specified: Secondary | ICD-10-CM | POA: Diagnosis not present

## 2022-04-27 ENCOUNTER — Inpatient Hospital Stay: Payer: Medicare Other | Attending: Hematology & Oncology

## 2022-04-27 ENCOUNTER — Inpatient Hospital Stay: Payer: Medicare Other | Admitting: Hematology & Oncology

## 2022-04-27 ENCOUNTER — Encounter: Payer: Self-pay | Admitting: Hematology & Oncology

## 2022-04-27 VITALS — BP 123/74 | HR 73 | Temp 97.7°F | Resp 20 | Ht 66.0 in | Wt 149.8 lb

## 2022-04-27 DIAGNOSIS — Z9221 Personal history of antineoplastic chemotherapy: Secondary | ICD-10-CM | POA: Diagnosis not present

## 2022-04-27 DIAGNOSIS — Z08 Encounter for follow-up examination after completed treatment for malignant neoplasm: Secondary | ICD-10-CM | POA: Diagnosis not present

## 2022-04-27 DIAGNOSIS — Z853 Personal history of malignant neoplasm of breast: Secondary | ICD-10-CM | POA: Diagnosis not present

## 2022-04-27 DIAGNOSIS — C50011 Malignant neoplasm of nipple and areola, right female breast: Secondary | ICD-10-CM

## 2022-04-27 LAB — CBC WITH DIFFERENTIAL (CANCER CENTER ONLY)
Abs Immature Granulocytes: 0.01 10*3/uL (ref 0.00–0.07)
Basophils Absolute: 0.1 10*3/uL (ref 0.0–0.1)
Basophils Relative: 1 %
Eosinophils Absolute: 0.2 10*3/uL (ref 0.0–0.5)
Eosinophils Relative: 4 %
HCT: 44.1 % (ref 36.0–46.0)
Hemoglobin: 14.6 g/dL (ref 12.0–15.0)
Immature Granulocytes: 0 %
Lymphocytes Relative: 31 %
Lymphs Abs: 1.7 10*3/uL (ref 0.7–4.0)
MCH: 31.6 pg (ref 26.0–34.0)
MCHC: 33.1 g/dL (ref 30.0–36.0)
MCV: 95.5 fL (ref 80.0–100.0)
Monocytes Absolute: 0.4 10*3/uL (ref 0.1–1.0)
Monocytes Relative: 7 %
Neutro Abs: 3 10*3/uL (ref 1.7–7.7)
Neutrophils Relative %: 57 %
Platelet Count: 242 10*3/uL (ref 150–400)
RBC: 4.62 MIL/uL (ref 3.87–5.11)
RDW: 12.4 % (ref 11.5–15.5)
WBC Count: 5.4 10*3/uL (ref 4.0–10.5)
nRBC: 0 % (ref 0.0–0.2)

## 2022-04-27 LAB — CMP (CANCER CENTER ONLY)
ALT: 17 U/L (ref 0–44)
AST: 18 U/L (ref 15–41)
Albumin: 4.1 g/dL (ref 3.5–5.0)
Alkaline Phosphatase: 78 U/L (ref 38–126)
Anion gap: 8 (ref 5–15)
BUN: 16 mg/dL (ref 8–23)
CO2: 30 mmol/L (ref 22–32)
Calcium: 9.5 mg/dL (ref 8.9–10.3)
Chloride: 106 mmol/L (ref 98–111)
Creatinine: 0.76 mg/dL (ref 0.44–1.00)
GFR, Estimated: >60 mL/min (ref >60)
Glucose, Bld: 124 mg/dL — ABNORMAL HIGH (ref 70–99)
Potassium: 4.9 mmol/L (ref 3.5–5.1)
Sodium: 144 mmol/L (ref 135–145)
Total Bilirubin: 0.6 mg/dL (ref 0.3–1.2)
Total Protein: 6.6 g/dL (ref 6.5–8.1)

## 2022-04-27 LAB — LACTATE DEHYDROGENASE: LDH: 124 U/L (ref 98–192)

## 2022-04-27 NOTE — Progress Notes (Signed)
Hematology and Oncology Follow Up Visit  Margaret English [id number] 03-Jul-1945 77 y.o. 04/27/2022   Principle Diagnosis:  1. 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 2. History of pulmonary emboli.  Current Therapy:   Aspirin 162 mg by mouth daily     Interim History:  [name] is back for follow-up.  As always, she is traveling.  She showed me pictures of her birthday party that she had in [place].  She had a wonderful time in [place].  She will then be going out to [place].  She will then go on a cruise ship to come back to the [place].  I am sure that she will have a wonderful time.  Otherwise, she is doing nicely.  She is really had no specific complaints.  There is no problems with fever.  She has had no problems with nausea or vomiting.  She has had no problems with bowels or bladder.  She has had no bleeding.  Currently, I would say that her performance status is probably ECOG 1.  .    Medications:  Current Outpatient Medications:    acetaminophen (TYLENOL) 500 MG tablet, Take 500 mg by mouth every 6 (six) hours as needed., Disp: , Rfl:    acyclovir cream (ZOVIRAX) 5 %, Apply topically as needed., Disp: , Rfl:    aspirin 81 MG EC tablet, Take 162 mg by mouth daily. Swallow whole., Disp: , Rfl:    Calcium-Vitamin D-Vitamin K (CALCIUM SOFT CHEWS PO), Take 750 mg by mouth 2 (two) times daily., Disp: , Rfl:    celecoxib (CELEBREX) 200 MG capsule, Take 200 mg by mouth as needed., Disp: , Rfl:    Cholecalciferol (VITAMIN D3) 50 MCG (2000 UT) CAPS, Take by mouth 2 (two) times daily., Disp: , Rfl:    fluticasone (FLONASE) 50 MCG/ACT nasal spray, Place into both nostrils as needed for allergies or rhinitis., Disp: , Rfl:    Multiple Vitamins-Minerals (SENIOR MULTIVITAMIN PLUS PO), Take by mouth every morning., Disp: , Rfl:    ALPRAZolam (XANAX) 0.5 MG tablet, Take 0.5 mg by mouth daily as needed. Uses when flying or occasional  sleep. (Patient not taking: Reported on 04/27/2022), Disp: , Rfl:    Fexofenadine HCl (ALLEGRA PO), Take 1 tablet by mouth as needed. (Patient not taking: Reported on 04/27/2022), Disp: , Rfl:    HM LIDOCAINE PATCH EX, Apply topically. (Patient not taking: Reported on 04/27/2022), Disp: , Rfl:    oxymetazoline (AFRIN EXTRA MOISTURIZING) 0.05 % nasal spray, Place 1 spray into both nostrils 2 (two) times daily as needed (Nosebleed). (Patient not taking: Reported on 04/27/2022), Disp: 30 mL, Rfl: 0   pseudoephedrine (SUDAFED) 30 MG tablet, Take 30 mg by mouth as needed for congestion. (Patient not taking: Reported on 04/27/2022), Disp: , Rfl:   Allergies:  Allergies  Allergen Reactions   Sulfa Antibiotics Other (See Comments)    Drainage and red eyes only allergic to sulfa eye drops   Sulfonamide Derivatives Other (See Comments)    Eye drops only   [facility] Astringent Eye Drops [Tetrahydrozoline-Zn Sulfate] Other (See Comments)    Past Medical History, Surgical history, Social history, and Family History were reviewed and updated.  Review of Systems: Review of Systems  Musculoskeletal:  Positive for back pain.  All other systems reviewed and are negative.   Physical Exam: Vital signs show temperature of 98.9.  Pulse 84.  Blood pressure 123/74.  Weight is 149 pounds.   Physical Exam Vitals reviewed.  HENT:     Head: Normocephalic and atraumatic.  Eyes:     Pupils: Pupils are equal, round, and reactive to light.  Cardiovascular:     Rate and Rhythm: Normal rate and regular rhythm.     Heart sounds: Normal heart sounds.  Pulmonary:     Effort: Pulmonary effort is normal.     Breath sounds: Normal breath sounds.  Abdominal:     General: Bowel sounds are normal.     Palpations: Abdomen is soft.  Musculoskeletal:        General: No tenderness or deformity. Normal range of motion.     Cervical back: Normal range of motion.     Comments: Chest wall exam shows bilateral implants.  She had  mastectomies bilaterally.  No erythema or nodularity is noted on the anterior chest wall.  She has no axillary adenopathy bilaterally.  Lymphadenopathy:     Cervical: No cervical adenopathy.  Skin:    General: Skin is warm and dry.     Findings: No erythema or rash.  Neurological:     Mental Status: She is alert and oriented to person, place, and time.  Psychiatric:        Behavior: Behavior normal.        Thought Content: Thought content normal.        Judgment: Judgment normal.     Lab Results  Component Value Date   WBC 5.4 04/27/2022   HGB 14.6 04/27/2022   HCT 44.1 04/27/2022   MCV 95.5 04/27/2022   PLT 242 04/27/2022     Chemistry      Component Value Date/Time   NA 144 04/27/2022 0958   NA 147 (H) 12/18/2016 1125   NA 143 12/14/2015 0932   K 4.9 04/27/2022 0958   K 4.2 12/18/2016 1125   K 4.3 12/14/2015 0932   CL 106 04/27/2022 0958   CL 103 12/18/2016 1125   CO2 30 04/27/2022 0958   CO2 30 12/18/2016 1125   CO2 27 12/14/2015 0932   BUN 16 04/27/2022 0958   BUN 11 12/18/2016 1125   BUN 10.2 12/14/2015 0932   CREATININE 0.76 04/27/2022 0958   CREATININE 0.9 12/18/2016 1125   CREATININE 0.8 12/14/2015 0932      Component Value Date/Time   CALCIUM 9.5 04/27/2022 0958   CALCIUM 10.3 12/18/2016 1125   CALCIUM 10.1 12/14/2015 0932   ALKPHOS 78 04/27/2022 0958   ALKPHOS 73 12/18/2016 1125   ALKPHOS 75 12/14/2015 0932   AST 18 04/27/2022 0958   AST 32 12/14/2015 0932   ALT 17 04/27/2022 0958   ALT 32 12/18/2016 1125   ALT 35 12/14/2015 0932   BILITOT 0.6 04/27/2022 0958   BILITOT 0.82 12/14/2015 0932      Impression and Plan: Margaret English is 77 year old white female. She has a history of recurrent ductal carcinoma the left breast. She underwent systemic chemotherapy for this. She has been out of therapy now for over 14 years. 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  As such, I have to believe that she is to be cured.  I am so happy that she can travel.   She really enjoys traveling.  She has such a good family.  We will still plan to get her back in 6 months.    Josph Macho, MD 4/19/202411:26 AM

## 2022-09-28 DIAGNOSIS — M25552 Pain in left hip: Secondary | ICD-10-CM | POA: Diagnosis not present

## 2022-09-28 DIAGNOSIS — M25562 Pain in left knee: Secondary | ICD-10-CM | POA: Diagnosis not present

## 2022-11-14 DIAGNOSIS — H43813 Vitreous degeneration, bilateral: Secondary | ICD-10-CM | POA: Diagnosis not present

## 2022-11-16 ENCOUNTER — Encounter: Payer: Self-pay | Admitting: Hematology & Oncology

## 2022-11-16 ENCOUNTER — Inpatient Hospital Stay: Payer: Medicare Other | Admitting: Hematology & Oncology

## 2022-11-16 ENCOUNTER — Inpatient Hospital Stay: Payer: Medicare Other | Attending: Hematology & Oncology

## 2022-11-16 ENCOUNTER — Other Ambulatory Visit: Payer: Self-pay

## 2022-11-16 VITALS — BP 136/65 | HR 73 | Temp 98.6°F | Resp 18 | Ht 66.0 in | Wt 148.1 lb

## 2022-11-16 DIAGNOSIS — C50011 Malignant neoplasm of nipple and areola, right female breast: Secondary | ICD-10-CM

## 2022-11-16 DIAGNOSIS — Z08 Encounter for follow-up examination after completed treatment for malignant neoplasm: Secondary | ICD-10-CM | POA: Insufficient documentation

## 2022-11-16 DIAGNOSIS — Z8616 Personal history of COVID-19: Secondary | ICD-10-CM | POA: Insufficient documentation

## 2022-11-16 DIAGNOSIS — R252 Cramp and spasm: Secondary | ICD-10-CM | POA: Insufficient documentation

## 2022-11-16 DIAGNOSIS — M79605 Pain in left leg: Secondary | ICD-10-CM | POA: Diagnosis not present

## 2022-11-16 DIAGNOSIS — Z9221 Personal history of antineoplastic chemotherapy: Secondary | ICD-10-CM | POA: Insufficient documentation

## 2022-11-16 DIAGNOSIS — Z86711 Personal history of pulmonary embolism: Secondary | ICD-10-CM | POA: Diagnosis not present

## 2022-11-16 DIAGNOSIS — R351 Nocturia: Secondary | ICD-10-CM | POA: Insufficient documentation

## 2022-11-16 DIAGNOSIS — Z853 Personal history of malignant neoplasm of breast: Secondary | ICD-10-CM | POA: Insufficient documentation

## 2022-11-16 DIAGNOSIS — Z7982 Long term (current) use of aspirin: Secondary | ICD-10-CM | POA: Diagnosis not present

## 2022-11-16 DIAGNOSIS — M79604 Pain in right leg: Secondary | ICD-10-CM

## 2022-11-16 LAB — CBC WITH DIFFERENTIAL (CANCER CENTER ONLY)
Abs Immature Granulocytes: 0.01 10*3/uL (ref 0.00–0.07)
Basophils Absolute: 0.1 10*3/uL (ref 0.0–0.1)
Basophils Relative: 1 %
Eosinophils Absolute: 0.2 10*3/uL (ref 0.0–0.5)
Eosinophils Relative: 4 %
HCT: 42.7 % (ref 36.0–46.0)
Hemoglobin: 14.2 g/dL (ref 12.0–15.0)
Immature Granulocytes: 0 %
Lymphocytes Relative: 31 %
Lymphs Abs: 1.4 10*3/uL (ref 0.7–4.0)
MCH: 31.6 pg (ref 26.0–34.0)
MCHC: 33.3 g/dL (ref 30.0–36.0)
MCV: 95.1 fL (ref 80.0–100.0)
Monocytes Absolute: 0.4 10*3/uL (ref 0.1–1.0)
Monocytes Relative: 8 %
Neutro Abs: 2.5 10*3/uL (ref 1.7–7.7)
Neutrophils Relative %: 56 %
Platelet Count: 224 10*3/uL (ref 150–400)
RBC: 4.49 MIL/uL (ref 3.87–5.11)
RDW: 12.3 % (ref 11.5–15.5)
WBC Count: 4.6 10*3/uL (ref 4.0–10.5)
nRBC: 0 % (ref 0.0–0.2)

## 2022-11-16 LAB — CMP (CANCER CENTER ONLY)
ALT: 12 U/L (ref 0–44)
AST: 16 U/L (ref 15–41)
Albumin: 4.4 g/dL (ref 3.5–5.0)
Alkaline Phosphatase: 63 U/L (ref 38–126)
Anion gap: 6 (ref 5–15)
BUN: 17 mg/dL (ref 8–23)
CO2: 31 mmol/L (ref 22–32)
Calcium: 9.8 mg/dL (ref 8.9–10.3)
Chloride: 104 mmol/L (ref 98–111)
Creatinine: 0.66 mg/dL (ref 0.44–1.00)
GFR, Estimated: >60 mL/min (ref >60)
Glucose, Bld: 89 mg/dL (ref 70–99)
Potassium: 4.3 mmol/L (ref 3.5–5.1)
Sodium: 141 mmol/L (ref 135–145)
Total Bilirubin: 0.7 mg/dL (ref <1.2)
Total Protein: 6.9 g/dL (ref 6.5–8.1)

## 2022-11-16 LAB — LACTATE DEHYDROGENASE: LDH: 137 U/L (ref 98–192)

## 2022-11-16 NOTE — Progress Notes (Signed)
Hematology and Oncology Follow Up Visit  Margaret English [id number] 24-Nov-1945 77 y.o. 11/16/2022   Principle Diagnosis:  1. 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 2. History of pulmonary emboli.  Current Therapy:   Aspirin 162 mg by mouth daily     Interim History:  Ms.  Margaret English is back for follow-up.  We saw her 6 months ago.  She been doing pretty well.  However, she got COVID I think back in August when she came back from [place] on a cruise.  Both she and her husband got it.  Thankfully, she got over this.  She has been complaining of some cramps in her lower legs.  She does have a past history of pulmonary embolism.  She is on baby aspirin right now.  I think we probably need to get some Dopplers of her lower legs just to make sure that there is no thromboembolic disease.  She has had no change in bowel or bladder habits.  She does drink a lot of water at nighttime so she does have some nocturia..  She has had no cough or shortness of breath.  There has been no nausea or vomiting.  She has had no obvious bleeding.  She does have bilateral implants in the breast.  There is are about a 77 years old.  I do not think that the need to be exchanged.  She has had no issues with weight loss or weight gain.  Weight has been trending slowly downward.  Overall, I would say that her performance status is probably ECOG 1.    Medications:  Current Outpatient Medications:    acetaminophen (TYLENOL) 500 MG tablet, Take 500 mg by mouth every 6 (six) hours as needed., Disp: , Rfl:    acyclovir cream (ZOVIRAX) 5 %, Apply topically as needed., Disp: , Rfl:    ALPRAZolam (XANAX) 0.5 MG tablet, Take 0.5 mg by mouth daily as needed. Uses when flying or occasional sleep., Disp: , Rfl:    aspirin 81 MG EC tablet, Take 162 mg by mouth daily. Swallow whole., Disp: , Rfl:    Calcium-Vitamin D-Vitamin K (CALCIUM SOFT CHEWS PO), Take 750 mg by mouth 2  (two) times daily., Disp: , Rfl:    celecoxib (CELEBREX) 200 MG capsule, Take 200 mg by mouth as needed., Disp: , Rfl:    Cholecalciferol (VITAMIN D3) 50 MCG (2000 UT) CAPS, Take by mouth 2 (two) times daily., Disp: , Rfl:    Fexofenadine HCl (ALLEGRA PO), Take 1 tablet by mouth as needed., Disp: , Rfl:    fluticasone (FLONASE) 50 MCG/ACT nasal spray, Place into both nostrils as needed for allergies or rhinitis., Disp: , Rfl:    HM LIDOCAINE PATCH EX, Apply topically., Disp: , Rfl:    Multiple Vitamins-Minerals (SENIOR MULTIVITAMIN PLUS PO), Take by mouth every morning., Disp: , Rfl:    oxymetazoline (AFRIN EXTRA MOISTURIZING) 0.05 % nasal spray, Place 1 spray into both nostrils 2 (two) times daily as needed (Nosebleed). (Patient not taking: Reported on 04/27/2022), Disp: 30 mL, Rfl: 0   pseudoephedrine (SUDAFED) 30 MG tablet, Take 30 mg by mouth as needed for congestion. (Patient not taking: Reported on 04/27/2022), Disp: , Rfl:   Allergies:  Allergies  Allergen Reactions   Sulfa Antibiotics Other (See Comments)    Drainage and red eyes only allergic to sulfa eye drops   Sulfonamide Derivatives Other (See Comments)    Eye drops  only   [facility] Astringent Eye Drops [Tetrahydrozoline-Zn Sulfate] Other (See Comments)    Past Medical History, Surgical history, Social history, and Family History were reviewed and updated.  Review of Systems: Review of Systems  Musculoskeletal:  Positive for back pain.  All other systems reviewed and are negative.   Physical Exam: Vital signs show temperature of 98.6.  Pulse 73.  Blood pressure 136/65.  Weight is 148 pounds.  Physical Exam Vitals reviewed.  HENT:     Head: Normocephalic and atraumatic.  Eyes:     Pupils: Pupils are equal, round, and reactive to light.  Cardiovascular:     Rate and Rhythm: Normal rate and regular rhythm.     Heart sounds: Normal heart sounds.  Pulmonary:     Effort: Pulmonary effort is normal.     Breath sounds: Normal  breath sounds.  Abdominal:     General: Bowel sounds are normal.     Palpations: Abdomen is soft.  Musculoskeletal:        General: No tenderness or deformity. Normal range of motion.     Cervical back: Normal range of motion.     Comments: Chest wall exam shows bilateral implants.  She had mastectomies bilaterally.  No erythema or nodularity is noted on the anterior chest wall.  She has no axillary adenopathy bilaterally.  Lymphadenopathy:     Cervical: No cervical adenopathy.  Skin:    General: Skin is warm and dry.     Findings: No erythema or rash.  Neurological:     Mental Status: She is alert and oriented to person, place, and time.  Psychiatric:        Behavior: Behavior normal.        Thought Content: Thought content normal.        Judgment: Judgment normal.      Lab Results  Component Value Date   WBC 4.6 11/16/2022   HGB 14.2 11/16/2022   HCT 42.7 11/16/2022   MCV 95.1 11/16/2022   PLT 224 11/16/2022     Chemistry      Component Value Date/Time   NA 141 11/16/2022 1019   NA 147 (H) 12/18/2016 1125   NA 143 12/14/2015 0932   K 4.3 11/16/2022 1019   K 4.2 12/18/2016 1125   K 4.3 12/14/2015 0932   CL 104 11/16/2022 1019   CL 103 12/18/2016 1125   CO2 31 11/16/2022 1019   CO2 30 12/18/2016 1125   CO2 27 12/14/2015 0932   BUN 17 11/16/2022 1019   BUN 11 12/18/2016 1125   BUN 10.2 12/14/2015 0932   CREATININE 0.66 11/16/2022 1019   CREATININE 0.9 12/18/2016 1125   CREATININE 0.8 12/14/2015 0932      Component Value Date/Time   CALCIUM 9.8 11/16/2022 1019   CALCIUM 10.3 12/18/2016 1125   CALCIUM 10.1 12/14/2015 0932   ALKPHOS 63 11/16/2022 1019   ALKPHOS 73 12/18/2016 1125   ALKPHOS 75 12/14/2015 0932   AST 16 11/16/2022 1019   AST 32 12/14/2015 0932   ALT 12 11/16/2022 1019   ALT 32 12/18/2016 1125   ALT 35 12/14/2015 0932   BILITOT 0.7 11/16/2022 1019   BILITOT 0.82 12/14/2015 0932      Impression and Plan: Margaret English is 77 year old white  female. She has a history of recurrent ductal carcinoma the left breast. She underwent systemic chemotherapy for this. She has been out of therapy now for 15 years. 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  As  such, I have to believe that she is to be cured.  Again, I think we probably need to check her legs for blood clots.  She does travel quite a bit.  She did have COVID.  I just worried that she would be at high risk for thromboembolic disease.  If all looks good on the Doppler, we will get her back in 6 months.   Josph Macho, MD 11/8/202411:56 AM

## 2022-11-17 LAB — CANCER ANTIGEN 27.29: CA 27.29: 18.9 U/mL (ref 0.0–38.6)

## 2022-11-19 ENCOUNTER — Encounter: Payer: Self-pay | Admitting: *Deleted

## 2022-11-26 ENCOUNTER — Other Ambulatory Visit (HOSPITAL_BASED_OUTPATIENT_CLINIC_OR_DEPARTMENT_OTHER): Payer: Self-pay

## 2022-11-26 ENCOUNTER — Ambulatory Visit (HOSPITAL_BASED_OUTPATIENT_CLINIC_OR_DEPARTMENT_OTHER)
Admission: RE | Admit: 2022-11-26 | Discharge: 2022-11-26 | Disposition: A | Discharge status: Home / Self Care | Payer: Medicare Other | Source: Ambulatory Visit | Attending: Hematology & Oncology | Admitting: Hematology & Oncology

## 2022-11-26 DIAGNOSIS — Z0389 Encounter for observation for other suspected diseases and conditions ruled out: Secondary | ICD-10-CM | POA: Diagnosis not present

## 2022-11-26 DIAGNOSIS — M79604 Pain in right leg: Secondary | ICD-10-CM | POA: Insufficient documentation

## 2022-11-26 DIAGNOSIS — M79605 Pain in left leg: Secondary | ICD-10-CM | POA: Insufficient documentation

## 2022-11-26 MED ORDER — INFLUENZA VAC A&B SURF ANT ADJ 0.5 ML IM SUSY
0.5000 mL | PREFILLED_SYRINGE | Freq: Once | INTRAMUSCULAR | 0 refills | Status: AC
  Filled 2022-11-26: qty 0.5, 1d supply, fill #0

## 2023-01-17 DIAGNOSIS — M810 Age-related osteoporosis without current pathological fracture: Secondary | ICD-10-CM | POA: Diagnosis not present

## 2023-01-17 DIAGNOSIS — K219 Gastro-esophageal reflux disease without esophagitis: Secondary | ICD-10-CM | POA: Diagnosis not present

## 2023-01-17 DIAGNOSIS — R7309 Other abnormal glucose: Secondary | ICD-10-CM | POA: Diagnosis not present

## 2023-01-17 DIAGNOSIS — E782 Mixed hyperlipidemia: Secondary | ICD-10-CM | POA: Diagnosis not present

## 2023-01-17 DIAGNOSIS — J309 Allergic rhinitis, unspecified: Secondary | ICD-10-CM | POA: Diagnosis not present

## 2023-01-17 DIAGNOSIS — Z131 Encounter for screening for diabetes mellitus: Secondary | ICD-10-CM | POA: Diagnosis not present

## 2023-01-17 DIAGNOSIS — G629 Polyneuropathy, unspecified: Secondary | ICD-10-CM | POA: Diagnosis not present

## 2023-01-17 DIAGNOSIS — Z0000 Encounter for general adult medical examination without abnormal findings: Secondary | ICD-10-CM | POA: Diagnosis not present

## 2023-01-17 DIAGNOSIS — E559 Vitamin D deficiency, unspecified: Secondary | ICD-10-CM | POA: Diagnosis not present

## 2023-01-17 DIAGNOSIS — R7989 Other specified abnormal findings of blood chemistry: Secondary | ICD-10-CM | POA: Diagnosis not present

## 2023-01-21 ENCOUNTER — Other Ambulatory Visit (HOSPITAL_BASED_OUTPATIENT_CLINIC_OR_DEPARTMENT_OTHER): Payer: Self-pay | Admitting: Family Medicine

## 2023-01-21 DIAGNOSIS — E782 Mixed hyperlipidemia: Secondary | ICD-10-CM

## 2023-01-22 ENCOUNTER — Other Ambulatory Visit: Payer: Self-pay | Admitting: Family Medicine

## 2023-01-22 DIAGNOSIS — M810 Age-related osteoporosis without current pathological fracture: Secondary | ICD-10-CM

## 2023-01-28 ENCOUNTER — Ambulatory Visit (HOSPITAL_BASED_OUTPATIENT_CLINIC_OR_DEPARTMENT_OTHER)
Admission: RE | Admit: 2023-01-28 | Discharge: 2023-01-28 | Disposition: A | Discharge status: Home / Self Care | Payer: Medicare Other | Source: Ambulatory Visit | Attending: Family Medicine | Admitting: Family Medicine

## 2023-01-28 DIAGNOSIS — E782 Mixed hyperlipidemia: Secondary | ICD-10-CM | POA: Insufficient documentation

## 2023-05-01 DIAGNOSIS — E559 Vitamin D deficiency, unspecified: Secondary | ICD-10-CM | POA: Diagnosis not present

## 2023-05-01 DIAGNOSIS — G629 Polyneuropathy, unspecified: Secondary | ICD-10-CM | POA: Diagnosis not present

## 2023-05-01 DIAGNOSIS — R7989 Other specified abnormal findings of blood chemistry: Secondary | ICD-10-CM | POA: Diagnosis not present

## 2023-05-01 DIAGNOSIS — R7309 Other abnormal glucose: Secondary | ICD-10-CM | POA: Diagnosis not present

## 2023-05-16 ENCOUNTER — Encounter: Payer: Self-pay | Admitting: Hematology & Oncology

## 2023-05-16 ENCOUNTER — Inpatient Hospital Stay: Payer: Medicare Other | Admitting: Hematology & Oncology

## 2023-05-16 ENCOUNTER — Other Ambulatory Visit: Payer: Self-pay

## 2023-05-16 ENCOUNTER — Inpatient Hospital Stay: Payer: Medicare Other | Attending: Hematology & Oncology

## 2023-05-16 VITALS — BP 127/71 | HR 71 | Resp 16 | Ht 66.0 in | Wt 148.0 lb

## 2023-05-16 DIAGNOSIS — C50011 Malignant neoplasm of nipple and areola, right female breast: Secondary | ICD-10-CM

## 2023-05-16 DIAGNOSIS — Z853 Personal history of malignant neoplasm of breast: Secondary | ICD-10-CM | POA: Insufficient documentation

## 2023-05-16 DIAGNOSIS — Z9221 Personal history of antineoplastic chemotherapy: Secondary | ICD-10-CM | POA: Diagnosis not present

## 2023-05-16 DIAGNOSIS — G629 Polyneuropathy, unspecified: Secondary | ICD-10-CM | POA: Diagnosis not present

## 2023-05-16 DIAGNOSIS — Z08 Encounter for follow-up examination after completed treatment for malignant neoplasm: Secondary | ICD-10-CM | POA: Diagnosis not present

## 2023-05-16 DIAGNOSIS — M810 Age-related osteoporosis without current pathological fracture: Secondary | ICD-10-CM | POA: Diagnosis not present

## 2023-05-16 LAB — CBC WITH DIFFERENTIAL (CANCER CENTER ONLY)
Abs Immature Granulocytes: 0.01 10*3/uL (ref 0.00–0.07)
Basophils Absolute: 0.1 10*3/uL (ref 0.0–0.1)
Basophils Relative: 2 %
Eosinophils Absolute: 0.2 10*3/uL (ref 0.0–0.5)
Eosinophils Relative: 4 %
HCT: 43.3 % (ref 36.0–46.0)
Hemoglobin: 14.5 g/dL (ref 12.0–15.0)
Immature Granulocytes: 0 %
Lymphocytes Relative: 36 %
Lymphs Abs: 1.6 10*3/uL (ref 0.7–4.0)
MCH: 31.8 pg (ref 26.0–34.0)
MCHC: 33.5 g/dL (ref 30.0–36.0)
MCV: 95 fL (ref 80.0–100.0)
Monocytes Absolute: 0.4 10*3/uL (ref 0.1–1.0)
Monocytes Relative: 9 %
Neutro Abs: 2.2 10*3/uL (ref 1.7–7.7)
Neutrophils Relative %: 49 %
Platelet Count: 237 10*3/uL (ref 150–400)
RBC: 4.56 MIL/uL (ref 3.87–5.11)
RDW: 12 % (ref 11.5–15.5)
WBC Count: 4.4 10*3/uL (ref 4.0–10.5)
nRBC: 0 % (ref 0.0–0.2)

## 2023-05-16 LAB — CMP (CANCER CENTER ONLY)
ALT: 17 U/L (ref 0–44)
AST: 20 U/L (ref 15–41)
Albumin: 4.4 g/dL (ref 3.5–5.0)
Alkaline Phosphatase: 86 U/L (ref 38–126)
Anion gap: 6 (ref 5–15)
BUN: 16 mg/dL (ref 8–23)
CO2: 32 mmol/L (ref 22–32)
Calcium: 9.6 mg/dL (ref 8.9–10.3)
Chloride: 106 mmol/L (ref 98–111)
Creatinine: 0.69 mg/dL (ref 0.44–1.00)
GFR, Estimated: >60 mL/min (ref >60)
Glucose, Bld: 121 mg/dL — ABNORMAL HIGH (ref 70–99)
Potassium: 4.7 mmol/L (ref 3.5–5.1)
Sodium: 144 mmol/L (ref 135–145)
Total Bilirubin: 0.6 mg/dL (ref 0.0–1.2)
Total Protein: 6.7 g/dL (ref 6.5–8.1)

## 2023-05-16 LAB — MAGNESIUM: Magnesium: 1.9 mg/dL (ref 1.7–2.4)

## 2023-05-16 NOTE — Progress Notes (Signed)
 Hematology and Oncology Follow Up Visit  Margaret English [id number] 12/17/1945 78 y.o. 05/16/2023   Principle Diagnosis:  1. 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 2. History of pulmonary emboli.  Current Therapy:   Aspirin 162 mg by mouth daily     Interim History:  Ms.  Margaret English is back for follow-up.  We saw her 6 months ago.  As always, Margaret English has been pretty active.  Margaret English does have to go on cruises.  I think Margaret English and her husband will probably going on a cruise in the next couple months.  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  I told her that Margaret English could certainly try gabapentin if Margaret English would like.  Margaret English will think about this.  Margaret English has had no problem with fever.  Margaret English has had no nausea or vomiting.  Margaret English has had no bleeding.  There is been no change in bowel or bladder habits.  Margaret English has had no rashes.  There has been no leg swelling.  Overall, I would have to say that her performance status is probably ECOG 1.     Medications:  Current Outpatient Medications:    Magnesium 250 MG TABS, Take 1 tablet by mouth at bedtime., Disp: , Rfl:    acetaminophen (TYLENOL) 500 MG tablet, Take 500 mg by mouth every 6 (six) hours as needed., Disp: , Rfl:    acyclovir cream (ZOVIRAX) 5 %, Apply topically as needed., Disp: , Rfl:    ALPRAZolam  (XANAX ) 0.5 MG tablet, Take 0.5 mg by mouth daily as needed. Uses when flying or occasional sleep., Disp: , Rfl:    aspirin 81 MG EC tablet, Take 162 mg by mouth daily. Swallow whole., Disp: , Rfl:    Calcium -Vitamin D -Vitamin K (CALCIUM  SOFT CHEWS PO), Take 750 mg by mouth 2 (two) times daily., Disp: , Rfl:    celecoxib (CELEBREX) 200 MG capsule, Take 200 mg by mouth as needed., Disp: , Rfl:    Cholecalciferol (VITAMIN D3) 50 MCG (2000 UT) CAPS, Take by mouth 2 (two) times daily., Disp: , Rfl:    Fexofenadine HCl (ALLEGRA PO), Take 1 tablet by mouth as needed., Disp: , Rfl:    fluticasone (FLONASE) 50 MCG/ACT  nasal spray, Place into both nostrils as needed for allergies or rhinitis., Disp: , Rfl:    HM LIDOCAINE PATCH EX, Apply topically., Disp: , Rfl:   Allergies:  Allergies  Allergen Reactions   Sulfa Antibiotics Other (See Comments)    Drainage and red eyes only allergic to sulfa eye drops   Sulfonamide Derivatives Other (See Comments)    Eye drops only   [facility] Astringent Eye Drops [Tetrahydrozoline-Zn Sulfate] Other (See Comments)    Past Medical History, Surgical history, Social history, and Family History were reviewed and updated.  Review of Systems: Review of Systems  Musculoskeletal:  Positive for back pain.  All other systems reviewed and are negative.   Physical Exam: Vital signs show temperature of of 98.4.  Pulse 71.  Blood pressure 127/71.  Weight is 148 pounds.    Physical Exam Vitals reviewed.  HENT:     Head: Normocephalic and atraumatic.  Eyes:     Pupils: Pupils are equal, round, and reactive to light.  Cardiovascular:     Rate and Rhythm: Normal rate and regular rhythm.     Heart sounds: Normal heart sounds.  Pulmonary:     Effort: Pulmonary effort is normal.  Breath sounds: Normal breath sounds.  Abdominal:     General: Bowel sounds are normal.     Palpations: Abdomen is soft.  Musculoskeletal:        General: No tenderness or deformity. Normal range of motion.     Cervical back: Normal range of motion.     Comments: Chest wall exam shows bilateral implants.  Margaret English had mastectomies bilaterally.  No erythema or nodularity is noted on the anterior chest wall.  Margaret English has no axillary adenopathy bilaterally.  Lymphadenopathy:     Cervical: No cervical adenopathy.  Skin:    General: Skin is warm and dry.     Findings: No erythema or rash.  Neurological:     Mental Status: Margaret English is alert and oriented to person, place, and time.  Psychiatric:        Behavior: Behavior normal.        Thought Content: Thought content normal.        Judgment: Judgment normal.       Lab Results  Component Value Date   WBC 4.4 05/16/2023   HGB 14.5 05/16/2023   HCT 43.3 05/16/2023   MCV 95.0 05/16/2023   PLT 237 05/16/2023     Chemistry      Component Value Date/Time   NA 144 05/16/2023 1012   NA 147 (H) 12/18/2016 1125   NA 143 12/14/2015 0932   K 4.7 05/16/2023 1012   K 4.2 12/18/2016 1125   K 4.3 12/14/2015 0932   CL 106 05/16/2023 1012   CL 103 12/18/2016 1125   CO2 32 05/16/2023 1012   CO2 30 12/18/2016 1125   CO2 27 12/14/2015 0932   BUN 16 05/16/2023 1012   BUN 11 12/18/2016 1125   BUN 10.2 12/14/2015 0932   CREATININE 0.69 05/16/2023 1012   CREATININE 0.9 12/18/2016 1125   CREATININE 0.8 12/14/2015 0932      Component Value Date/Time   CALCIUM  9.6 05/16/2023 1012   CALCIUM  10.3 12/18/2016 1125   CALCIUM  10.1 12/14/2015 0932   ALKPHOS 86 05/16/2023 1012   ALKPHOS 73 12/18/2016 1125   ALKPHOS 75 12/14/2015 0932   AST 20 05/16/2023 1012   AST 32 12/14/2015 0932   ALT 17 05/16/2023 1012   ALT 32 12/18/2016 1125   ALT 35 12/14/2015 0932   BILITOT 0.6 05/16/2023 1012   BILITOT 0.82 12/14/2015 0932      Impression and Plan: [name] is 78 year old white female. Margaret English has a history of recurrent ductal carcinoma the left breast. Margaret English underwent systemic chemotherapy for this. Margaret English has been out of therapy now for 15 years. 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  As such, I have to believe that Margaret English is to be cured.  At this point in time, we do not need to do any scans on her.  We did check her for blood clots last time that Margaret English was here and everything looked okay.  As always, we will get her back in 6 months.  Ivor Mars, MD 5/8/202512:00 PM

## 2023-05-16 NOTE — Addendum Note (Signed)
 Addended by: Gray Layman R on: 05/16/2023 12:07 PM   Modules accepted: Orders

## 2023-07-03 ENCOUNTER — Ambulatory Visit

## 2023-07-03 DIAGNOSIS — M810 Age-related osteoporosis without current pathological fracture: Secondary | ICD-10-CM | POA: Diagnosis not present

## 2023-07-03 DIAGNOSIS — Z780 Asymptomatic menopausal state: Secondary | ICD-10-CM | POA: Diagnosis not present

## 2023-07-08 DIAGNOSIS — M810 Age-related osteoporosis without current pathological fracture: Secondary | ICD-10-CM | POA: Diagnosis not present

## 2023-07-08 DIAGNOSIS — E782 Mixed hyperlipidemia: Secondary | ICD-10-CM | POA: Diagnosis not present

## 2023-07-08 DIAGNOSIS — Z853 Personal history of malignant neoplasm of breast: Secondary | ICD-10-CM | POA: Diagnosis not present

## 2023-08-07 ENCOUNTER — Other Ambulatory Visit: Payer: Medicare Other

## 2023-08-08 DIAGNOSIS — M810 Age-related osteoporosis without current pathological fracture: Secondary | ICD-10-CM | POA: Diagnosis not present

## 2023-08-08 DIAGNOSIS — Z853 Personal history of malignant neoplasm of breast: Secondary | ICD-10-CM | POA: Diagnosis not present

## 2023-08-08 DIAGNOSIS — E782 Mixed hyperlipidemia: Secondary | ICD-10-CM | POA: Diagnosis not present

## 2023-09-08 DIAGNOSIS — E782 Mixed hyperlipidemia: Secondary | ICD-10-CM | POA: Diagnosis not present

## 2023-09-08 DIAGNOSIS — M810 Age-related osteoporosis without current pathological fracture: Secondary | ICD-10-CM | POA: Diagnosis not present

## 2023-09-08 DIAGNOSIS — Z853 Personal history of malignant neoplasm of breast: Secondary | ICD-10-CM | POA: Diagnosis not present

## 2023-09-13 DIAGNOSIS — T148XXA Other injury of unspecified body region, initial encounter: Secondary | ICD-10-CM | POA: Diagnosis not present

## 2023-10-18 ENCOUNTER — Other Ambulatory Visit (HOSPITAL_BASED_OUTPATIENT_CLINIC_OR_DEPARTMENT_OTHER): Payer: Self-pay

## 2023-10-18 MED ORDER — FLUZONE HIGH-DOSE 0.5 ML IM SUSY
0.5000 mL | PREFILLED_SYRINGE | Freq: Once | INTRAMUSCULAR | 0 refills | Status: AC
  Filled 2023-10-18: qty 0.5, 1d supply, fill #0

## 2023-11-12 ENCOUNTER — Ambulatory Visit: Admitting: Hematology & Oncology

## 2023-11-12 ENCOUNTER — Inpatient Hospital Stay

## 2023-12-04 ENCOUNTER — Encounter: Payer: Self-pay | Admitting: Hematology & Oncology

## 2023-12-04 ENCOUNTER — Inpatient Hospital Stay: Attending: Hematology & Oncology

## 2023-12-04 ENCOUNTER — Inpatient Hospital Stay: Admitting: Hematology & Oncology

## 2023-12-04 VITALS — BP 118/74 | HR 86 | Temp 98.6°F | Resp 18 | Ht 66.0 in | Wt 148.8 lb

## 2023-12-04 DIAGNOSIS — M810 Age-related osteoporosis without current pathological fracture: Secondary | ICD-10-CM

## 2023-12-04 DIAGNOSIS — Z08 Encounter for follow-up examination after completed treatment for malignant neoplasm: Secondary | ICD-10-CM | POA: Insufficient documentation

## 2023-12-04 DIAGNOSIS — Z853 Personal history of malignant neoplasm of breast: Secondary | ICD-10-CM | POA: Insufficient documentation

## 2023-12-04 DIAGNOSIS — C50011 Malignant neoplasm of nipple and areola, right female breast: Secondary | ICD-10-CM

## 2023-12-04 DIAGNOSIS — G629 Polyneuropathy, unspecified: Secondary | ICD-10-CM | POA: Insufficient documentation

## 2023-12-04 LAB — CBC WITH DIFFERENTIAL (CANCER CENTER ONLY)
Abs Immature Granulocytes: 0.01 K/uL (ref 0.00–0.07)
Basophils Absolute: 0.1 K/uL (ref 0.0–0.1)
Basophils Relative: 1 %
Eosinophils Absolute: 0.2 K/uL (ref 0.0–0.5)
Eosinophils Relative: 3 %
HCT: 44.1 % (ref 36.0–46.0)
Hemoglobin: 14.7 g/dL (ref 12.0–15.0)
Immature Granulocytes: 0 %
Lymphocytes Relative: 32 %
Lymphs Abs: 1.6 K/uL (ref 0.7–4.0)
MCH: 31.9 pg (ref 26.0–34.0)
MCHC: 33.3 g/dL (ref 30.0–36.0)
MCV: 95.7 fL (ref 80.0–100.0)
Monocytes Absolute: 0.5 K/uL (ref 0.1–1.0)
Monocytes Relative: 9 %
Neutro Abs: 2.8 K/uL (ref 1.7–7.7)
Neutrophils Relative %: 55 %
Platelet Count: 262 K/uL (ref 150–400)
RBC: 4.61 MIL/uL (ref 3.87–5.11)
RDW: 12.2 % (ref 11.5–15.5)
WBC Count: 5.1 K/uL (ref 4.0–10.5)
nRBC: 0 % (ref 0.0–0.2)

## 2023-12-04 LAB — CMP (CANCER CENTER ONLY)
ALT: 16 U/L (ref 0–44)
AST: 22 U/L (ref 15–41)
Albumin: 4.3 g/dL (ref 3.5–5.0)
Alkaline Phosphatase: 93 U/L (ref 38–126)
Anion gap: 10 (ref 5–15)
BUN: 15 mg/dL (ref 8–23)
CO2: 27 mmol/L (ref 22–32)
Calcium: 9.6 mg/dL (ref 8.9–10.3)
Chloride: 107 mmol/L (ref 98–111)
Creatinine: 0.71 mg/dL (ref 0.44–1.00)
GFR, Estimated: >60 mL/min (ref >60)
Glucose, Bld: 101 mg/dL — ABNORMAL HIGH (ref 70–99)
Potassium: 4.7 mmol/L (ref 3.5–5.1)
Sodium: 144 mmol/L (ref 135–145)
Total Bilirubin: 0.7 mg/dL (ref 0.0–1.2)
Total Protein: 6.8 g/dL (ref 6.5–8.1)

## 2023-12-04 LAB — LACTATE DEHYDROGENASE: LDH: 149 U/L (ref 105–235)

## 2023-12-04 LAB — VITAMIN D 25 HYDROXY (VIT D DEFICIENCY, FRACTURES): Vit D, 25-Hydroxy: 62.13 ng/mL (ref 30–100)

## 2023-12-04 NOTE — Progress Notes (Signed)
 Hematology and Oncology Follow Up Visit  Margaret English [id number] 31-May-1945 78 y.o. 12/04/2023   Principle Diagnosis:  1. 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 2. History of pulmonary emboli.  Current Therapy:   Aspirin 162 mg by mouth daily     Interim History:  [name] is back for follow-up.  We saw her 6 months ago.  As always, she has been pretty active.  She does at her 34th wedding anniversary.  I saw pictures.  As always, she and her husband are really doing well.  They do traveling.  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  She has really had no complaints.  She does have the neuropathy in her feet.  This might be a little bit better..  She has had no problems with cough or shortness of breath.  She has had no change in bowel or bladder habits.  She has had no headache.  There has been no bleeding.  Thankfully, she has avoided COVID.  Currently, I will say that her performance status is probably ECOG 1.   Medications:  Current Outpatient Medications:    acetaminophen (TYLENOL) 500 MG tablet, Take 500 mg by mouth every 6 (six) hours as needed., Disp: , Rfl:    acyclovir cream (ZOVIRAX) 5 %, Apply topically as needed., Disp: , Rfl:    ALPRAZolam  (XANAX ) 0.5 MG tablet, Take 0.5 mg by mouth daily as needed. Uses when flying or occasional sleep., Disp: , Rfl:    aspirin 81 MG EC tablet, Take 162 mg by mouth daily. Swallow whole., Disp: , Rfl:    Calcium -Vitamin D -Vitamin K (CALCIUM  SOFT CHEWS PO), Take 750 mg by mouth 2 (two) times daily., Disp: , Rfl:    celecoxib (CELEBREX) 200 MG capsule, Take 200 mg by mouth as needed., Disp: , Rfl:    Cholecalciferol (VITAMIN D3) 50 MCG (2000 UT) CAPS, Take by mouth 2 (two) times daily., Disp: , Rfl:    Fexofenadine HCl (ALLEGRA PO), Take 1 tablet by mouth as needed., Disp: , Rfl:    fluticasone (FLONASE) 50 MCG/ACT nasal spray, Place into both nostrils as needed for  allergies or rhinitis., Disp: , Rfl:    HM LIDOCAINE PATCH EX, Apply topically., Disp: , Rfl:    Magnesium 250 MG TABS, Take 1 tablet by mouth at bedtime., Disp: , Rfl:   Allergies:  Allergies  Allergen Reactions   Sulfa Antibiotics Other (See Comments)    Drainage and red eyes only allergic to sulfa eye drops   Sulfonamide Derivatives Other (See Comments)    Eye drops only   Latex Itching and Dermatitis    Latex bandages   [facility] Astringent Eye Drops [Tetrahydrozoline-Zn Sulfate] Other (See Comments)    Past Medical History, Surgical history, Social history, and Family History were reviewed and updated.  Review of Systems: Review of Systems  Musculoskeletal:  Positive for back pain.  All other systems reviewed and are negative.   Physical Exam: Vital signs show temperature of of 98.6.  Pulse 86.  Blood pressure 118/74.  Weight is 148 pounds.   Physical Exam Vitals reviewed.  HENT:     Head: Normocephalic and atraumatic.  Eyes:     Pupils: Pupils are equal, round, and reactive to light.  Cardiovascular:     Rate and Rhythm: Normal rate and regular rhythm.     Heart sounds: Normal heart sounds.  Pulmonary:  Effort: Pulmonary effort is normal.     Breath sounds: Normal breath sounds.  Abdominal:     General: Bowel sounds are normal.     Palpations: Abdomen is soft.  Musculoskeletal:        General: No tenderness or deformity. Normal range of motion.     Cervical back: Normal range of motion.     Comments: Chest wall exam shows bilateral implants.  She had mastectomies bilaterally.  No erythema or nodularity is noted on the anterior chest wall.  She has no axillary adenopathy bilaterally.  Lymphadenopathy:     Cervical: No cervical adenopathy.  Skin:    General: Skin is warm and dry.     Findings: No erythema or rash.  Neurological:     Mental Status: She is alert and oriented to person, place, and time.  Psychiatric:        Behavior: Behavior normal.         Thought Content: Thought content normal.        Judgment: Judgment normal.      Lab Results  Component Value Date   WBC 5.1 12/04/2023   HGB 14.7 12/04/2023   HCT 44.1 12/04/2023   MCV 95.7 12/04/2023   PLT 262 12/04/2023     Chemistry      Component Value Date/Time   NA 144 05/16/2023 1012   NA 147 (H) 12/18/2016 1125   NA 143 12/14/2015 0932   K 4.7 05/16/2023 1012   K 4.2 12/18/2016 1125   K 4.3 12/14/2015 0932   CL 106 05/16/2023 1012   CL 103 12/18/2016 1125   CO2 32 05/16/2023 1012   CO2 30 12/18/2016 1125   CO2 27 12/14/2015 0932   BUN 16 05/16/2023 1012   BUN 11 12/18/2016 1125   BUN 10.2 12/14/2015 0932   CREATININE 0.69 05/16/2023 1012   CREATININE 0.9 12/18/2016 1125   CREATININE 0.8 12/14/2015 0932      Component Value Date/Time   CALCIUM  9.6 05/16/2023 1012   CALCIUM  10.3 12/18/2016 1125   CALCIUM  10.1 12/14/2015 0932   ALKPHOS 86 05/16/2023 1012   ALKPHOS 73 12/18/2016 1125   ALKPHOS 75 12/14/2015 0932   AST 20 05/16/2023 1012   AST 32 12/14/2015 0932   ALT 17 05/16/2023 1012   ALT 32 12/18/2016 1125   ALT 35 12/14/2015 0932   BILITOT 0.6 05/16/2023 1012   BILITOT 0.82 12/14/2015 0932      Impression and Plan: Margaret English is 78 year old white female. She has a history of recurrent ductal carcinoma the left breast. She underwent systemic chemotherapy for this. She has been out of therapy now for over 15 years. 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  As such, I have to believe that she is to be cured.  At this point in time, we do not need to do any scans on her.  We did check her for blood clots last time that she was here and everything looked okay.  As always, we will get her back in 6 months.  Margaret JONELLE Crease, MD 11/26/202511:02 AM

## 2023-12-18 ENCOUNTER — Emergency Department (HOSPITAL_BASED_OUTPATIENT_CLINIC_OR_DEPARTMENT_OTHER)

## 2023-12-18 ENCOUNTER — Other Ambulatory Visit: Payer: Self-pay

## 2023-12-18 ENCOUNTER — Inpatient Hospital Stay (HOSPITAL_COMMUNITY)

## 2023-12-18 ENCOUNTER — Encounter (HOSPITAL_BASED_OUTPATIENT_CLINIC_OR_DEPARTMENT_OTHER): Payer: Self-pay | Admitting: Emergency Medicine

## 2023-12-18 ENCOUNTER — Inpatient Hospital Stay (HOSPITAL_BASED_OUTPATIENT_CLINIC_OR_DEPARTMENT_OTHER)
Admission: EM | Admit: 2023-12-18 | Discharge: 2024-01-03 | DRG: 964 | Disposition: A | Discharge status: Skilled Nursing Facility | Attending: Surgery | Admitting: Surgery

## 2023-12-18 DIAGNOSIS — M800AXA Age-related osteoporosis with current pathological fracture, other site, initial encounter for fracture: Secondary | ICD-10-CM | POA: Diagnosis present

## 2023-12-18 DIAGNOSIS — Z853 Personal history of malignant neoplasm of breast: Secondary | ICD-10-CM

## 2023-12-18 DIAGNOSIS — H9190 Unspecified hearing loss, unspecified ear: Secondary | ICD-10-CM | POA: Diagnosis present

## 2023-12-18 DIAGNOSIS — Z7982 Long term (current) use of aspirin: Secondary | ICD-10-CM

## 2023-12-18 DIAGNOSIS — M80062A Age-related osteoporosis with current pathological fracture, left lower leg, initial encounter for fracture: Secondary | ICD-10-CM | POA: Diagnosis present

## 2023-12-18 DIAGNOSIS — W109XXA Fall (on) (from) unspecified stairs and steps, initial encounter: Secondary | ICD-10-CM | POA: Diagnosis present

## 2023-12-18 DIAGNOSIS — Z91040 Latex allergy status: Secondary | ICD-10-CM | POA: Diagnosis not present

## 2023-12-18 DIAGNOSIS — K5900 Constipation, unspecified: Secondary | ICD-10-CM | POA: Diagnosis not present

## 2023-12-18 DIAGNOSIS — S270XXA Traumatic pneumothorax, initial encounter: Principal | ICD-10-CM | POA: Diagnosis present

## 2023-12-18 DIAGNOSIS — F430 Acute stress reaction: Secondary | ICD-10-CM | POA: Diagnosis present

## 2023-12-18 DIAGNOSIS — G620 Drug-induced polyneuropathy: Secondary | ICD-10-CM | POA: Diagnosis present

## 2023-12-18 DIAGNOSIS — W19XXXA Unspecified fall, initial encounter: Secondary | ICD-10-CM

## 2023-12-18 DIAGNOSIS — Z79899 Other long term (current) drug therapy: Secondary | ICD-10-CM

## 2023-12-18 DIAGNOSIS — Y92009 Unspecified place in unspecified non-institutional (private) residence as the place of occurrence of the external cause: Secondary | ICD-10-CM

## 2023-12-18 DIAGNOSIS — Z9841 Cataract extraction status, right eye: Secondary | ICD-10-CM

## 2023-12-18 DIAGNOSIS — R000 Tachycardia, unspecified: Secondary | ICD-10-CM | POA: Diagnosis not present

## 2023-12-18 DIAGNOSIS — R110 Nausea: Secondary | ICD-10-CM | POA: Diagnosis not present

## 2023-12-18 DIAGNOSIS — S42002A Fracture of unspecified part of left clavicle, initial encounter for closed fracture: Principal | ICD-10-CM

## 2023-12-18 DIAGNOSIS — M800B2A Age-related osteoporosis with current pathological fracture, left pelvis, initial encounter for fracture: Secondary | ICD-10-CM | POA: Diagnosis present

## 2023-12-18 DIAGNOSIS — T451X5A Adverse effect of antineoplastic and immunosuppressive drugs, initial encounter: Secondary | ICD-10-CM | POA: Diagnosis present

## 2023-12-18 DIAGNOSIS — T1490XA Injury, unspecified, initial encounter: Secondary | ICD-10-CM | POA: Diagnosis present

## 2023-12-18 DIAGNOSIS — E041 Nontoxic single thyroid nodule: Secondary | ICD-10-CM | POA: Diagnosis present

## 2023-12-18 DIAGNOSIS — M80012A Age-related osteoporosis with current pathological fracture, left shoulder, initial encounter for fracture: Secondary | ICD-10-CM | POA: Diagnosis present

## 2023-12-18 DIAGNOSIS — F419 Anxiety disorder, unspecified: Secondary | ICD-10-CM | POA: Diagnosis present

## 2023-12-18 DIAGNOSIS — Z9013 Acquired absence of bilateral breasts and nipples: Secondary | ICD-10-CM | POA: Diagnosis not present

## 2023-12-18 DIAGNOSIS — Z882 Allergy status to sulfonamides status: Secondary | ICD-10-CM

## 2023-12-18 DIAGNOSIS — Z90710 Acquired absence of both cervix and uterus: Secondary | ICD-10-CM

## 2023-12-18 DIAGNOSIS — D649 Anemia, unspecified: Secondary | ICD-10-CM | POA: Diagnosis not present

## 2023-12-18 DIAGNOSIS — M8088XA Other osteoporosis with current pathological fracture, vertebra(e), initial encounter for fracture: Secondary | ICD-10-CM | POA: Diagnosis present

## 2023-12-18 DIAGNOSIS — S82102A Unspecified fracture of upper end of left tibia, initial encounter for closed fracture: Secondary | ICD-10-CM

## 2023-12-18 DIAGNOSIS — Z9842 Cataract extraction status, left eye: Secondary | ICD-10-CM | POA: Diagnosis not present

## 2023-12-18 DIAGNOSIS — Z87442 Personal history of urinary calculi: Secondary | ICD-10-CM

## 2023-12-18 LAB — BASIC METABOLIC PANEL WITH GFR
Anion gap: 11 (ref 5–15)
BUN: 16 mg/dL (ref 8–23)
CO2: 26 mmol/L (ref 22–32)
Calcium: 9.7 mg/dL (ref 8.9–10.3)
Chloride: 104 mmol/L (ref 98–111)
Creatinine, Ser: 0.57 mg/dL (ref 0.44–1.00)
GFR, Estimated: >60 mL/min (ref >60)
Glucose, Bld: 141 mg/dL — ABNORMAL HIGH (ref 70–99)
Potassium: 3.8 mmol/L (ref 3.5–5.1)
Sodium: 141 mmol/L (ref 135–145)

## 2023-12-18 LAB — CBC WITH DIFFERENTIAL/PLATELET
Abs Immature Granulocytes: 0.15 K/uL — ABNORMAL HIGH (ref 0.00–0.07)
Basophils Absolute: 0 K/uL (ref 0.0–0.1)
Basophils Relative: 0 %
Eosinophils Absolute: 0 K/uL (ref 0.0–0.5)
Eosinophils Relative: 0 %
HCT: 39.6 % (ref 36.0–46.0)
Hemoglobin: 13.7 g/dL (ref 12.0–15.0)
Immature Granulocytes: 1 %
Lymphocytes Relative: 5 %
Lymphs Abs: 0.8 K/uL (ref 0.7–4.0)
MCH: 32.1 pg (ref 26.0–34.0)
MCHC: 34.6 g/dL (ref 30.0–36.0)
MCV: 92.7 fL (ref 80.0–100.0)
Monocytes Absolute: 0.6 K/uL (ref 0.1–1.0)
Monocytes Relative: 4 %
Neutro Abs: 14.4 K/uL — ABNORMAL HIGH (ref 1.7–7.7)
Neutrophils Relative %: 90 %
Platelets: 178 K/uL (ref 150–400)
RBC: 4.27 MIL/uL (ref 3.87–5.11)
RDW: 12.1 % (ref 11.5–15.5)
WBC: 16 K/uL — ABNORMAL HIGH (ref 4.0–10.5)
nRBC: 0 % (ref 0.0–0.2)

## 2023-12-18 MED ORDER — HYDROMORPHONE HCL 1 MG/ML IJ SOLN
1.0000 mg | INTRAMUSCULAR | Status: DC | PRN
  Administered 2023-12-19: 1 mg via INTRAVENOUS
  Filled 2023-12-18: qty 1

## 2023-12-18 MED ORDER — METHOCARBAMOL 500 MG PO TABS
500.0000 mg | ORAL_TABLET | Freq: Three times a day (TID) | ORAL | Status: DC
  Administered 2023-12-19 – 2023-12-20 (×5): 500 mg via ORAL
  Filled 2023-12-18 (×5): qty 1

## 2023-12-18 MED ORDER — KCL IN DEXTROSE-NACL 20-5-0.45 MEQ/L-%-% IV SOLN
INTRAVENOUS | Status: DC
  Filled 2023-12-18: qty 1000

## 2023-12-18 MED ORDER — ONDANSETRON HCL 4 MG/2ML IJ SOLN
4.0000 mg | Freq: Once | INTRAMUSCULAR | Status: AC
  Administered 2023-12-18: 4 mg via INTRAVENOUS
  Filled 2023-12-18: qty 2

## 2023-12-18 MED ORDER — ACETAMINOPHEN 500 MG PO TABS
1000.0000 mg | ORAL_TABLET | Freq: Four times a day (QID) | ORAL | Status: DC
  Administered 2023-12-19 – 2024-01-03 (×56): 1000 mg via ORAL
  Filled 2023-12-18 (×58): qty 2

## 2023-12-18 MED ORDER — HYDRALAZINE HCL 20 MG/ML IJ SOLN: 10.0000 mg | INTRAMUSCULAR | Status: DC | PRN

## 2023-12-18 MED ORDER — OXYCODONE-ACETAMINOPHEN 5-325 MG PO TABS: 1.0000 | ORAL_TABLET | ORAL | 0 refills | Status: DC | PRN

## 2023-12-18 MED ORDER — GABAPENTIN 300 MG PO CAPS
300.0000 mg | ORAL_CAPSULE | Freq: Three times a day (TID) | ORAL | Status: DC
  Administered 2023-12-18 – 2024-01-03 (×47): 300 mg via ORAL
  Filled 2023-12-18 (×47): qty 1

## 2023-12-18 MED ORDER — ENOXAPARIN SODIUM 30 MG/0.3ML IJ SOSY
30.0000 mg | PREFILLED_SYRINGE | Freq: Two times a day (BID) | INTRAMUSCULAR | Status: DC
  Administered 2023-12-19 – 2024-01-03 (×31): 30 mg via SUBCUTANEOUS
  Filled 2023-12-18 (×32): qty 0.3

## 2023-12-18 MED ORDER — MORPHINE SULFATE (PF) 4 MG/ML IV SOLN
4.0000 mg | Freq: Once | INTRAVENOUS | Status: AC
  Administered 2023-12-18: 4 mg via INTRAVENOUS
  Filled 2023-12-18: qty 1

## 2023-12-18 MED ORDER — ONDANSETRON HCL 4 MG/2ML IJ SOLN
4.0000 mg | Freq: Four times a day (QID) | INTRAMUSCULAR | Status: DC | PRN
  Administered 2023-12-19: 4 mg via INTRAVENOUS
  Filled 2023-12-18: qty 2

## 2023-12-18 MED ORDER — ONDANSETRON 4 MG PO TBDP: 4.0000 mg | ORAL_TABLET | Freq: Four times a day (QID) | ORAL | Status: DC | PRN

## 2023-12-18 MED ORDER — ACETAMINOPHEN 500 MG PO TABS
1000.0000 mg | ORAL_TABLET | Freq: Once | ORAL | Status: AC
  Administered 2023-12-18: 1000 mg via ORAL
  Filled 2023-12-18: qty 2

## 2023-12-18 MED ORDER — OXYCODONE HCL 5 MG PO TABS
5.0000 mg | ORAL_TABLET | ORAL | Status: DC | PRN
  Administered 2023-12-19 – 2023-12-20 (×3): 5 mg via ORAL
  Filled 2023-12-18 (×3): qty 1

## 2023-12-18 MED ORDER — METHOCARBAMOL 1000 MG/10ML IJ SOLN
500.0000 mg | Freq: Three times a day (TID) | INTRAMUSCULAR | Status: DC
  Administered 2023-12-18: 500 mg via INTRAVENOUS
  Filled 2023-12-18: qty 10

## 2023-12-18 MED ORDER — POLYETHYLENE GLYCOL 3350 17 G PO PACK: 17.0000 g | PACK | Freq: Every day | ORAL | Status: DC | PRN

## 2023-12-18 MED ORDER — DOCUSATE SODIUM 100 MG PO CAPS
100.0000 mg | ORAL_CAPSULE | Freq: Two times a day (BID) | ORAL | Status: DC
  Administered 2023-12-18 – 2024-01-03 (×17): 100 mg via ORAL
  Filled 2023-12-18 (×28): qty 1

## 2023-12-18 NOTE — ED Provider Notes (Signed)
 Received patient in turnover from Dr. Zavitz.  Please see their note for further details of Hx, PE.  Briefly patient is a 78 y.o. female with a Fall .  Nonsyncopal complaining of left lower extremity discomfort.  Found to have left clavicle fracture left acetabular fracture left tibial plateau fracture.  Plan for blood work CT imaging discussion with Ortho.  CT imaging concerning for 5 rib fractures on the left left-sided pubic ramus fractures with some concern for extension into the acetabulum and left tibial plateau fracture.  I discussed the orthopedic injuries with Dr. Beuford.  Recommended knee immobilizer orthopedics to see in the hospital. On reassessment of the patient she had been placed on oxygen.  Satting well on 2 L.    Will discuss with trauma.  Discussed case with Dr. Ann recommended ordering CT head and C-spine.  She recommended ED to ED transfer to [facility].  I did review the patient's medical record.  She does have a history of a prior T12 compression fracture.  She denies any back pain.  I think that finding on CT is likely old.  I discussed case with Dr. Melvenia accepts the patient in ED to ED transfer.     Emil Share, DO 12/18/23 [phone]

## 2023-12-18 NOTE — H&P (Signed)
 HPI  Margaret English is an 78 y.o. female who presents to [facility] as transfer from [facility] after a mechanical fall.  Patient was at home earlier today when she tripped over a pair of shoes and fell. She presented with left shoulder pain, left hip pain and left leg pain. She was found to have multiple orthopaedic injuries as well as rib fractures and trace left pneumothorax.  No abdominal pain, complains of left sided chest, hip and leg pain.  10 point review of systems is negative except as listed above in HPI.  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 She has never used smokeless tobacco. She reports current alcohol use. She reports that she does not use drugs.  Allergies:  Allergies  Allergen Reactions   Sulfa Antibiotics Other (See Comments)    Drainage and red eyes only allergic to sulfa eye drops   Sulfonamide Derivatives Other (See Comments)    Eye drops only   Latex Itching and Dermatitis    Latex bandages   [facility] Astringent Eye Drops [Tetrahydrozoline-Zn Sulfate] Other (See Comments)    Medications: I have reviewed the patient's current medications.  Labs: I have personally reviewed all labs for the past 24h  Imaging: I have personally reviewed and interpreted  all imaging for the past 24h and agree with the radiologist's impression.  CT Cervical Spine Wo Contrast Result Date: 12/18/2023 CLINICAL DATA:  Neck trauma mechanical fall EXAM: CT CERVICAL SPINE WITHOUT CONTRAST TECHNIQUE: Multidetector CT imaging of the cervical spine was performed without intravenous contrast. Multiplanar CT image reconstructions were also generated. RADIATION DOSE REDUCTION: This exam was performed according to the departmental dose-optimization program which includes automated exposure control, adjustment of the mA and/or kV according to patient size and/or use of iterative reconstruction technique. COMPARISON:  None Available. FINDINGS: Alignment: Mild reversal of cervical lordosis. Trace anterolisthesis C3 on C4, C4 on C5 and C7 on T1. Facet alignment is normal Skull base and vertebrae: No acute fracture. No primary bone lesion or focal pathologic process. Soft tissues and spinal canal: No prevertebral fluid or swelling. No visible canal hematoma. Disc levels: None multilevel degenerative changes. Moderate disc space narrowing C2-C3, C4-C5, C5-C6 and C6-C7. Multilevel facet degenerative changes. Left greater than right multilevel foraminal narrowing Upper chest: Lung apices are clear. 1.7 cm hypodense right thyroid nodule increased compared to prior. Other: None IMPRESSION: 1. Mild reversal of cervical lordosis with multilevel degenerative changes. No acute osseous abnormality. 2. 1.7 cm hypodense right thyroid nodule. Recommend non emergent thyroid ultrasound evaluation  Electronically Signed   By: Luke Bun M.D.   On: 12/18/2023 18:13   CT Head Wo Contrast Result Date: 12/18/2023 CLINICAL DATA:  Head trauma mechanical fall EXAM: CT HEAD WITHOUT CONTRAST TECHNIQUE: Contiguous axial images were obtained from the base of the skull through the vertex without intravenous contrast. RADIATION DOSE REDUCTION: This exam was performed according to the departmental dose-optimization  program which includes automated exposure control, adjustment of the mA and/or kV according to patient size and/or use of iterative reconstruction technique. COMPARISON:  CT brain report 06/24/2007 FINDINGS: Brain: No acute territorial infarction, hemorrhage or intracranial mass. Mild atrophy. No ventricular enlargement. Vascular: No hyperdense vessels.  Carotid vascular calcification Skull: Normal. Negative for fracture or focal lesion. Sinuses/Orbits: Mild mucosal thickening in the sinuses Other: None IMPRESSION: No CT evidence for acute intracranial abnormality. Mild atrophy. Electronically Signed   By: Luke Bun M.D.   On: 12/18/2023 18:08   CT Knee Left Wo Contrast Result Date: 12/18/2023 CLINICAL DATA:  Fall.  Lateral tibial plateau fracture. EXAM: CT OF THE LEFT KNEE WITHOUT CONTRAST TECHNIQUE: Multidetector CT imaging of the left knee was performed according to the standard protocol. Multiplanar CT image reconstructions were also generated. RADIATION DOSE REDUCTION: This exam was performed according to the departmental dose-optimization program which includes automated exposure control, adjustment of the mA and/or kV according to patient size and/or use of iterative reconstruction technique. COMPARISON:  Earlier same day radiographs of the left tibia/fibula. FINDINGS: Bones/Joint/Cartilage Comminuted fracture of the lateral tibial plateau with minimal medial displacement anteriorly measuring less than 2 mm. There is mild articular surface depression measuring approximately 1-2 mm. Moderate volume lipohemarthrosis. No additional fracture identified. No dislocation. Mild medial femorotibial compartment joint space narrowing. Ligaments Ligaments are suboptimally evaluated by CT. Muscles and Tendons Muscles are within normal limits. The quadriceps and patellar tendons are intact. Soft tissue No fluid collection or hematoma. IMPRESSION: 1. Comminuted fracture of the lateral tibial plateau with mild  articular surface depression measuring 1-2 mm (Schatzker type II). 2. Moderate volume lipohemarthrosis. Electronically Signed   By: Harrietta Sherry M.D.   On: 12/18/2023 16:28   CT CHEST ABDOMEN PELVIS WO CONTRAST Result Date: 12/18/2023 CLINICAL DATA:  Fall, pelvic fractures. Left shoulder pain and left hip pain. EXAM: CT CHEST, ABDOMEN AND PELVIS WITHOUT CONTRAST TECHNIQUE: Multidetector CT imaging of the chest, abdomen and pelvis was performed following the standard protocol without IV contrast. RADIATION DOSE REDUCTION: This exam was performed according to the departmental dose-optimization program which includes automated exposure control, adjustment of the mA and/or kV according to patient size and/or use of iterative reconstruction technique. COMPARISON:  CT chest 07/23/2017.  CT abdomen pelvis 12/15/2015. FINDINGS: CT CHEST FINDINGS Cardiovascular: Atherosclerotic calcification of the aorta. Heart size normal. No pericardial effusion. No pericardial effusion. Mediastinum/Nodes: No pathologically enlarged mediastinal or axillary lymph nodes. Hilar regions are difficult to definitively evaluate without IV contrast. Surgical clips in the axillary regions. Esophagus is grossly unremarkable. Lungs/Pleura: Focal rounded mucoid impaction in the medial right lower lobe (3/120), unchanged. Bibasilar scarring and volume loss. Trace medial pleural air in the upper left hemithorax. No pleural fluid. Airway is unremarkable. Musculoskeletal: Nondisplaced or minimally displaced anterolateral left third, fourth, fifth, sixth and seventh rib fractures. Minimally displaced fracture of the lateral left clavicle. Osteopenia. T12 compression fracture, age indeterminate. CT ABDOMEN PELVIS FINDINGS Hepatobiliary: Patient's left hand and arm create streak artifact in the upper abdomen, degrading image quality. Liver and gallbladder are grossly unremarkable. No biliary ductal dilatation. Pancreas:  Negative. Spleen: Negative.  Adrenals/Urinary Tract: Adrenal glands and right kidney are unremarkable. Small left renal stone. Ureters are decompressed. Bladder is grossly unremarkable. Stomach/Bowel: Stomach, small bowel and colon are unremarkable. Appendix is not visualized. Vascular/Lymphatic: Atherosclerotic calcification of the aorta. No pathologically enlarged lymph nodes. Reproductive: Hysterectomy.  No adnexal mass. Other: Hemorrhagic stranding between the bladder and left acetabulum. Otherwise, no free fluid. No free air. Mesenteries and peritoneum are unremarkable. Musculoskeletal: Nondisplaced left superior and inferior pubic rami fractures with extension to the anterior left acetabulum. Probable nondisplaced left pubic bone fracture. Nondisplaced right inferior pubic ramus fracture. Osteopenia. Dextroconvex scoliosis of the lumbar spine. IMPRESSION: 1. Multiple nondisplaced or minimally displaced left anterolateral rib fractures with the tiniest medial pneumothorax, likely clinically insignificant. 2. Lateral left clavicle fracture. 3. Left superior and inferior pubic rami and left pubic bone fractures with associated hematoma in the medial soft tissues. Left superior pubic ramus fracture appears to involve the anterior left acetabulum. Probable nondisplaced right inferior pubic ramus fracture. 4. T12 compression fracture, age indeterminate. 5. Small left renal stone. 6.  Aortic atherosclerosis (ICD10-I70.0). Electronically Signed   By: Newell Eke M.D.   On: 12/18/2023 16:22   DG Hip Unilat With Pelvis 2-3 Views Left Result Date: 12/18/2023 EXAM: 2 or 3 VIEW(S) XRAY OF THE LEFT HIP 12/18/2023 02:43:00 PM COMPARISON: None available. CLINICAL HISTORY: fall FINDINGS: BONES AND JOINTS: Nondisplaced fracture is noted at the junction of the left acetabulum and left superior pubic ramus. CT scan is recommended for further evaluation. No malalignment. SOFT TISSUES: The soft tissues are unremarkable. IMPRESSION: 1. Nondisplaced  fracture at the junction of the left acetabulum and left superior pubic ramus. Recommend CT scan. Electronically signed by: Lynwood Seip MD 12/18/2023 03:12 PM EST RP Workstation: [id number]   DG FEMUR 1V LEFT Result Date: 12/18/2023 EXAM: 1 VIEW(S) XRAY OF THE LEFT FEMUR 12/18/2023 02:43:00 PM COMPARISON: None available. CLINICAL HISTORY: fall FINDINGS: BONES AND JOINTS: No acute fracture. No malalignment. SOFT TISSUES: The soft tissues are unremarkable. IMPRESSION: 1. No acute fracture or dislocation. Electronically signed by: Lynwood Seip MD 12/18/2023 03:10 PM EST RP Workstation: [id number]   DG Tibia/Fibula Left Port Result Date: 12/18/2023 EXAM: VIEW(S) XRAY OF THE LEFT TIBIA AND FIBULA 12/18/2023 02:43:00 PM COMPARISON: None available. CLINICAL HISTORY: [id number] Fall 190176 FINDINGS: BONES AND JOINTS: Nondisplaced lateral tibial plateau fracture is noted. The fibula is unremarkable. No malalignment. SOFT TISSUES: The soft tissues are unremarkable. IMPRESSION: 1. Nondisplaced lateral tibial plateau fracture. Electronically signed by: Lynwood Seip MD 12/18/2023 03:09 PM EST RP Workstation: [id number]   DG Shoulder Left Port Result Date: 12/18/2023 EXAM: 1 VIEW(S) XRAY OF THE LEFT SHOULDER 12/18/2023 02:43:00 PM COMPARISON: None available. CLINICAL HISTORY: 809823 Fall 190176 FINDINGS: BONES AND JOINTS: Glenohumeral joint is normally aligned. Minimally displaced distal left clavicular fracture is noted. No malalignment. The [facility] joint is unremarkable. SOFT TISSUES: No abnormal calcifications. Visualized lung is unremarkable. IMPRESSION: 1. Minimally displaced distal left clavicular fracture. Electronically signed by: Lynwood Seip MD 12/18/2023 03:07 PM EST RP Workstation: [id number]     Physical Exam Blood pressure 135/84, pulse 95, temperature 98 F (36.7 C), temperature source Oral, resp. rate 12, height 5' 6 (1.676 m), weight 67.5 kg, SpO2 96%. General: NAD HEENT: pupils equal, round, reactive to  light, moist conjunctiva, external inspection of ears and nose normal, hearing intact Oropharynx: normal oropharyngeal mucosa, normal dentition Neck: no thyromegaly, trachea midline, no midline cervical tenderness to palpation, no cervical spine step offs, C-collar already removed CV: Regular  rate and rhythm, normotensive Chest:  normal respiratory effort, no midline chest wall tenderness to palpation, + left lateral chest wall tenderness to palpation and over clavicle Abdomen: soft, nontender, and nondistended, no bruising  GU: normal external female genitalia Back:no thoracic spine tenderness to palpation, no lumbar spine tenderness to palpation, no thoracic spine stepoffs, no lumbar spine stepoffs Rectal: deferred Extremities: left lower extremity in knee immobilizer, tender over left knee and over left hip Skin: warm and dry Psych: normal memory, normal mood/affect  Neuro: GCS15 ([id number])    Assessment   Margaret English is an 78 y.o. female s/p mechanical fall on 12/18/23  Known Injuries: - Trace left pneumothorax - Left 3-7 rib fx - Left clavicle fracture - T12 compression fracture, age indeterminate - Nondisplaced left superior and inferior pubic rami fractures with extension into anterior left acetabulum - Nondisplaced left pubic bone fracture - Nondisplaced right inferior pubic rami fracture  Incidental Findings: - 1.7cm right thyroid nodule  Plan  - Ok for regular diet until midnight, then NPO - IVF - Orthopaedics to evaluate in AM and determine weight bearing status and whether or not needs operative intervention - LUE in sling - LLE in knee immobilizer - Repeat CXR tonight to monitor ptx and again in AM. No need for chest tube at this time. - DVT - SCDs, LMWH - Dispo - 4NP   I reviewed ED provider notes, last 24 h vitals and pain scores, last 48 h intake and output, last 24 h labs and trends, and last 24 h imaging results. I discussed plan of care with EDP and Dr.  Kendal.  This care required high  level of medical decision making.    Orie Silversmith, MD General Surgery, Surgical Critical Care and Trauma

## 2023-12-18 NOTE — ED Notes (Signed)
 Purewick applied.

## 2023-12-18 NOTE — Progress Notes (Signed)
 Trauma Event Note    Pt transferred from [facility] ED- via [facility]. A/O x 4, w/d- states she fell down 1 step and landed on her left side on concrete in the garage.  No resp distress, does have sling on left arm for clavicle fx.   Last imported Vital Signs BP 113/69   Pulse (!) 106   Temp 98 F (36.7 C) (Oral)   Resp (!) 22   Ht 5' 6 (1.676 m)   Wt 148 lb 13 oz (67.5 kg)   SpO2 91%   BMI 24.02 kg/m   Trending CBC Recent Labs    12/18/23 1533  WBC 16.0*  HGB 13.7  HCT 39.6  PLT 178    Trending Coag's No results for input(s): APTT, INR in the last 72 hours.  Trending BMET Recent Labs    12/18/23 1533  NA 141  K 3.8  CL 104  CO2 26  BUN 16  CREATININE 0.57  GLUCOSE 141*      Margaret English  Trauma Response RN  Please call TRN at [phone] for further assistance.

## 2023-12-18 NOTE — ED Provider Notes (Signed)
 Margaret English   CSN: [id number] Arrival date & time: 12/18/23  1311     Patient presents with: Margaret English is a 78 y.o. female who presents after transfer from [facility] following a mechanical fall.  Found to have left clavicle fracture, left acetabular fracture, left tibial plateau fracture, 5 rib fractures on the left side and left-sided pubic ramus fracture with extension into the acetabulum.    Fall       Prior to Admission medications   Medication Sig Start Date End Date Taking? Authorizing Provider  acetaminophen  (TYLENOL ) 500 MG tablet Take 500 mg by mouth every 6 (six) hours as needed.   Yes [provider]  acyclovir (ZOVIRAX) 200 MG capsule Take 200-600 mg by mouth See admin instructions. 400 mg in the morning and 600 in the evening if she has a break out   Yes [provider]  ALPRAZolam  (XANAX ) 0.5 MG tablet Take 0.5 mg by mouth daily as needed. Uses when flying or occasional sleep. 06/03/20  Yes [provider]  aspirin 81 MG EC tablet Take 162 mg by mouth at bedtime. Swallow whole.   Yes [provider]  Calcium -Vitamin D -Vitamin K (CALCIUM  SOFT CHEWS PO) Take 750 mg by mouth daily as needed (bone).   Yes [provider]  celecoxib (CELEBREX) 200 MG capsule Take 200 mg by mouth as needed. 01/20/21  Yes [provider]  Cholecalciferol (VITAMIN D3) 50 MCG (2000 UT) CAPS Take by mouth 2 (two) times daily.   Yes [provider]  Elderberry 500 MG CAPS Take 1 capsule by mouth daily.   Yes [provider]  fluticasone (FLONASE) 50 MCG/ACT nasal spray Place 2 sprays into both nostrils as needed for allergies or rhinitis.   Yes [provider]  Magnesium 250 MG TABS Take 1 tablet by mouth 3 (three) times a week.   Yes [provider]  OVER THE COUNTER MEDICATION Take 1 tablet by mouth daily. WEEM supplement   Yes [provider]  oxyCODONE-acetaminophen  (PERCOCET/ROXICET) 5-325 MG tablet Take 1 tablet by mouth every 4 (four) hours as needed for severe pain (pain score 7-10) or moderate pain (pain score 4-6). 12/18/23  Yes Zavitz, Joshua, MD    Allergies: Sulfa antibiotics, Sulfonamide derivatives, Latex, and [facility] astringent eye drops [tetrahydrozoline-zn sulfate]    Review of Systems  Updated Vital Signs BP 121/71   Pulse (!) 103   Temp 98.1 F (36.7 C) (Oral)   Resp 10   Ht 5' 6 (1.676 m)   Wt 67.5 kg   SpO2 99%   BMI 24.02 kg/m   Physical Exam Vitals and nursing English reviewed.  Constitutional:      General: She is not in acute distress.    Appearance: She is well-developed.  HENT:     Head: Normocephalic and atraumatic.  Eyes:     Conjunctiva/sclera: Conjunctivae normal.  Cardiovascular:     Rate and Rhythm: Normal rate and regular rhythm.     Heart sounds: No murmur heard. Pulmonary:     Effort: Pulmonary effort is normal. No respiratory distress.     Breath sounds: Normal breath sounds.  Abdominal:     Palpations: Abdomen is soft.     Tenderness: There is no abdominal tenderness.  Musculoskeletal:     Cervical back: Neck supple.     Comments: Patient maintained in left upper extremity sling and left knee immobilizer, neurovascular  intact, compartments soft  Skin:    General: Skin is warm and dry.     Capillary Refill: Capillary refill takes less than 2 seconds.  Neurological:     Mental Status: She is alert.  Psychiatric:        Mood and Affect: Mood normal.     (all labs ordered are listed, but only abnormal results are displayed) Labs Reviewed  BASIC METABOLIC PANEL WITH GFR - Abnormal; Notable for the following components:      Result Value   Glucose, Bld 141 (*)    All other components within normal limits  CBC WITH DIFFERENTIAL/PLATELET - Abnormal; Notable for the following components:   WBC 16.0 (*)    Neutro Abs 14.4 (*)    Abs Immature Granulocytes 0.15  (*)    All other components within normal limits  CBC  BASIC METABOLIC PANEL WITH GFR    EKG: None  Radiology: CT Cervical Spine Wo Contrast Result Date: 12/18/2023 CLINICAL DATA:  Neck trauma mechanical fall EXAM: CT CERVICAL SPINE WITHOUT CONTRAST TECHNIQUE: Multidetector CT imaging of the cervical spine was performed without intravenous contrast. Multiplanar CT image reconstructions were also generated. RADIATION DOSE REDUCTION: This exam was performed according to the departmental dose-optimization program which includes automated exposure control, adjustment of the mA and/or kV according to patient size and/or use of iterative reconstruction technique. COMPARISON:  None Available. FINDINGS: Alignment: Mild reversal of cervical lordosis. Trace anterolisthesis C3 on C4, C4 on C5 and C7 on T1. Facet alignment is normal Skull base and vertebrae: No acute fracture. No primary bone lesion or focal pathologic process. Soft tissues and spinal canal: No prevertebral fluid or swelling. No visible canal hematoma. Disc levels: None multilevel degenerative changes. Moderate disc space narrowing C2-C3, C4-C5, C5-C6 and C6-C7. Multilevel facet degenerative changes. Left greater than right multilevel foraminal narrowing Upper chest: Lung apices are clear. 1.7 cm hypodense right thyroid nodule increased compared to prior. Other: None IMPRESSION: 1. Mild reversal of cervical lordosis with multilevel degenerative changes. No acute osseous abnormality. 2. 1.7 cm hypodense right thyroid nodule. Recommend non emergent thyroid ultrasound evaluation Electronically Signed   By: Luke Bun M.D.   On: 12/18/2023 18:13   CT Head Wo Contrast Result Date: 12/18/2023 CLINICAL DATA:  Head trauma mechanical fall EXAM: CT HEAD WITHOUT CONTRAST TECHNIQUE: Contiguous axial images were obtained from the base of the skull through the vertex without intravenous contrast. RADIATION DOSE REDUCTION: This exam was performed according  to the departmental dose-optimization program which includes automated exposure control, adjustment of the mA and/or kV according to patient size and/or use of iterative reconstruction technique. COMPARISON:  CT brain report 06/24/2007 FINDINGS: Brain: No acute territorial infarction, hemorrhage or intracranial mass. Mild atrophy. No ventricular enlargement. Vascular: No hyperdense vessels.  Carotid vascular calcification Skull: Normal. Negative for fracture or focal lesion. Sinuses/Orbits: Mild mucosal thickening in the sinuses Other: None IMPRESSION: No CT evidence for acute intracranial abnormality. Mild atrophy. Electronically Signed   By: Luke Bun M.D.   On: 12/18/2023 18:08   CT Knee Left Wo Contrast Result Date: 12/18/2023 CLINICAL DATA:  Fall.  Lateral tibial plateau fracture. EXAM: CT OF THE LEFT KNEE WITHOUT CONTRAST TECHNIQUE: Multidetector CT imaging of the left knee was performed according to the standard protocol. Multiplanar CT image reconstructions were also generated. RADIATION DOSE REDUCTION: This exam was performed according to the departmental dose-optimization program which includes automated exposure control, adjustment of the mA and/or kV according to patient size and/or use  of iterative reconstruction technique. COMPARISON:  Earlier same day radiographs of the left tibia/fibula. FINDINGS: Bones/Joint/Cartilage Comminuted fracture of the lateral tibial plateau with minimal medial displacement anteriorly measuring less than 2 mm. There is mild articular surface depression measuring approximately 1-2 mm. Moderate volume lipohemarthrosis. No additional fracture identified. No dislocation. Mild medial femorotibial compartment joint space narrowing. Ligaments Ligaments are suboptimally evaluated by CT. Muscles and Tendons Muscles are within normal limits. The quadriceps and patellar tendons are intact. Soft tissue No fluid collection or hematoma. IMPRESSION: 1. Comminuted fracture of the  lateral tibial plateau with mild articular surface depression measuring 1-2 mm (Schatzker type II). 2. Moderate volume lipohemarthrosis. Electronically Signed   By: Harrietta Sherry M.D.   On: 12/18/2023 16:28   CT CHEST ABDOMEN PELVIS WO CONTRAST Result Date: 12/18/2023 CLINICAL DATA:  Fall, pelvic fractures. Left shoulder pain and left hip pain. EXAM: CT CHEST, ABDOMEN AND PELVIS WITHOUT CONTRAST TECHNIQUE: Multidetector CT imaging of the chest, abdomen and pelvis was performed following the standard protocol without IV contrast. RADIATION DOSE REDUCTION: This exam was performed according to the departmental dose-optimization program which includes automated exposure control, adjustment of the mA and/or kV according to patient size and/or use of iterative reconstruction technique. COMPARISON:  CT chest 07/23/2017.  CT abdomen pelvis 12/15/2015. FINDINGS: CT CHEST FINDINGS Cardiovascular: Atherosclerotic calcification of the aorta. Heart size normal. No pericardial effusion. No pericardial effusion. Mediastinum/Nodes: No pathologically enlarged mediastinal or axillary lymph nodes. Hilar regions are difficult to definitively evaluate without IV contrast. Surgical clips in the axillary regions. Esophagus is grossly unremarkable. Lungs/Pleura: Focal rounded mucoid impaction in the medial right lower lobe (3/120), unchanged. Bibasilar scarring and volume loss. Trace medial pleural air in the upper left hemithorax. No pleural fluid. Airway is unremarkable. Musculoskeletal: Nondisplaced or minimally displaced anterolateral left third, fourth, fifth, sixth and seventh rib fractures. Minimally displaced fracture of the lateral left clavicle. Osteopenia. T12 compression fracture, age indeterminate. CT ABDOMEN PELVIS FINDINGS Hepatobiliary: Patient's left hand and arm create streak artifact in the upper abdomen, degrading image quality. Liver and gallbladder are grossly unremarkable. No biliary ductal dilatation.  Pancreas: Negative. Spleen: Negative. Adrenals/Urinary Tract: Adrenal glands and right kidney are unremarkable. Small left renal stone. Ureters are decompressed. Bladder is grossly unremarkable. Stomach/Bowel: Stomach, small bowel and colon are unremarkable. Appendix is not visualized. Vascular/Lymphatic: Atherosclerotic calcification of the aorta. No pathologically enlarged lymph nodes. Reproductive: Hysterectomy.  No adnexal mass. Other: Hemorrhagic stranding between the bladder and left acetabulum. Otherwise, no free fluid. No free air. Mesenteries and peritoneum are unremarkable. Musculoskeletal: Nondisplaced left superior and inferior pubic rami fractures with extension to the anterior left acetabulum. Probable nondisplaced left pubic bone fracture. Nondisplaced right inferior pubic ramus fracture. Osteopenia. Dextroconvex scoliosis of the lumbar spine. IMPRESSION: 1. Multiple nondisplaced or minimally displaced left anterolateral rib fractures with the tiniest medial pneumothorax, likely clinically insignificant. 2. Lateral left clavicle fracture. 3. Left superior and inferior pubic rami and left pubic bone fractures with associated hematoma in the medial soft tissues. Left superior pubic ramus fracture appears to involve the anterior left acetabulum. Probable nondisplaced right inferior pubic ramus fracture. 4. T12 compression fracture, age indeterminate. 5. Small left renal stone. 6.  Aortic atherosclerosis (ICD10-I70.0). Electronically Signed   By: Newell Eke M.D.   On: 12/18/2023 16:22   DG Hip Unilat With Pelvis 2-3 Views Left Result Date: 12/18/2023 EXAM: 2 or 3 VIEW(S) XRAY OF THE LEFT HIP 12/18/2023 02:43:00 PM COMPARISON: None available. CLINICAL HISTORY: fall FINDINGS: BONES AND  JOINTS: Nondisplaced fracture is noted at the junction of the left acetabulum and left superior pubic ramus. CT scan is recommended for further evaluation. No malalignment. SOFT TISSUES: The soft tissues are  unremarkable. IMPRESSION: 1. Nondisplaced fracture at the junction of the left acetabulum and left superior pubic ramus. Recommend CT scan. Electronically signed by: Lynwood Seip MD 12/18/2023 03:12 PM EST RP Workstation: [id number]   DG FEMUR 1V LEFT Result Date: 12/18/2023 EXAM: 1 VIEW(S) XRAY OF THE LEFT FEMUR 12/18/2023 02:43:00 PM COMPARISON: None available. CLINICAL HISTORY: fall FINDINGS: BONES AND JOINTS: No acute fracture. No malalignment. SOFT TISSUES: The soft tissues are unremarkable. IMPRESSION: 1. No acute fracture or dislocation. Electronically signed by: Lynwood Seip MD 12/18/2023 03:10 PM EST RP Workstation: [id number]   DG Tibia/Fibula Left Port Result Date: 12/18/2023 EXAM: VIEW(S) XRAY OF THE LEFT TIBIA AND FIBULA 12/18/2023 02:43:00 PM COMPARISON: None available. CLINICAL HISTORY: 809823 Fall 190176 FINDINGS: BONES AND JOINTS: Nondisplaced lateral tibial plateau fracture is noted. The fibula is unremarkable. No malalignment. SOFT TISSUES: The soft tissues are unremarkable. IMPRESSION: 1. Nondisplaced lateral tibial plateau fracture. Electronically signed by: Lynwood Seip MD 12/18/2023 03:09 PM EST RP Workstation: [id number]   DG Shoulder Left Port Result Date: 12/18/2023 EXAM: 1 VIEW(S) XRAY OF THE LEFT SHOULDER 12/18/2023 02:43:00 PM COMPARISON: None available. CLINICAL HISTORY: 809823 Fall 190176 FINDINGS: BONES AND JOINTS: Glenohumeral joint is normally aligned. Minimally displaced distal left clavicular fracture is noted. No malalignment. The [facility] joint is unremarkable. SOFT TISSUES: No abnormal calcifications. Visualized lung is unremarkable. IMPRESSION: 1. Minimally displaced distal left clavicular fracture. Electronically signed by: Lynwood Seip MD 12/18/2023 03:07 PM EST RP Workstation: [id number]     Procedures   Medications Ordered in the ED  acetaminophen  (TYLENOL ) tablet 1,000 mg (has no administration in time range)  oxyCODONE (Oxy IR/ROXICODONE) immediate release  tablet 5 mg (has no administration in time range)  HYDROmorphone (DILAUDID) injection 1 mg (has no administration in time range)  methocarbamol  (ROBAXIN ) tablet 500 mg ( Oral See Alternative 12/18/23 2145)    Or  methocarbamol  (ROBAXIN ) injection 500 mg (500 mg Intravenous Given 12/18/23 2145)  gabapentin  (NEURONTIN ) capsule 300 mg (300 mg Oral Given 12/18/23 2146)  docusate sodium  (COLACE) capsule 100 mg (100 mg Oral Given 12/18/23 2146)  polyethylene glycol (MIRALAX / GLYCOLAX) packet 17 g (has no administration in time range)  ondansetron  (ZOFRAN -ODT) disintegrating tablet 4 mg (has no administration in time range)    Or  ondansetron  (ZOFRAN ) injection 4 mg (has no administration in time range)  hydrALAZINE (APRESOLINE) injection 10 mg (has no administration in time range)  enoxaparin (LOVENOX) injection 30 mg (has no administration in time range)  dextrose  5 % and 0.45 % NaCl with KCl 20 mEq/L infusion ( Intravenous New Bag/Given 12/18/23 2150)  acetaminophen  (TYLENOL ) tablet 1,000 mg (1,000 mg Oral Given 12/18/23 1431)  morphine  (PF) 4 MG/ML injection 4 mg (4 mg Intravenous Given 12/18/23 1541)  ondansetron  (ZOFRAN ) injection 4 mg (4 mg Intravenous Given 12/18/23 1541)    Clinical Course as of 12/18/23 2151  Wed Dec 18, 2023  1918 Discussed patient with Dr. Ann, trauma.  Will come evaluate and plan to take patient to trauma service. [JT]    Clinical Course User Index [JT] Donnajean Lynwood DEL, PA-C                                 Medical Decision Making Amount and/or Complexity of Data  Reviewed Labs: ordered. Radiology: ordered.  Risk OTC drugs. Prescription drug management. Decision regarding hospitalization.   This patient presents to the ED with chief complaint(s) of fall .  The complaint involves an extensive differential diagnosis and also carries with it a high risk of complications and morbidity.   Pertinent past medical history as listed in HPI  The differential  diagnosis includes  Fracture, dislocation, sprain, compartment syndrome, intracranial/thoracic/abdominal injury Additional history obtained: Records reviewed Care Everywhere/External Records  Disposition:   Patient will be admitted for further management  Social Determinants of Health:   none  This English was dictated with voice recognition software.  Despite best efforts at proofreading, errors may have occurred which can change the documentation meaning.       Final diagnoses:  Closed displaced fracture of left clavicle, unspecified part of clavicle, initial encounter  Fall, initial encounter  Closed fracture of proximal end of left tibia, unspecified fracture morphology, initial encounter    ED Discharge Orders          Ordered    oxyCODONE-acetaminophen  (PERCOCET/ROXICET) 5-325 MG tablet  Every 4 hours PRN        12/18/23 1509               Donnajean Lynwood DEL, PA-C 12/18/23 2151    Kingsley, Victoria K, DO 12/18/23 2307

## 2023-12-18 NOTE — ED Notes (Signed)
 Called Monisha at [facility] for transport-Dr. Melvenia accepting [facility] at 17:31-TC

## 2023-12-18 NOTE — ED Notes (Signed)
 Patient place on 2L [place] d/t low oxygen sats after pain meds. MD and RT notified

## 2023-12-18 NOTE — ED Triage Notes (Signed)
 Pt via pov from home (had to be helped from car) after mechanical fall this morning. Pt has pain to her left shoulder and left hip. Pt began to feel dizzy and like she was going to pass out in the lobby and was moved to room 15. Pt denies head injury; states she takes 2x low dose aspirin daily. Some nystagmus present upon examination. A&o x 4; nad noted.

## 2023-12-18 NOTE — ED Provider Notes (Signed)
 [place] [facility]MENT AT [facility] Provider Note   CSN: [id number] Arrival date & time: 12/18/23  1311     Patient presents with: Margaret English is a 78 y.o. female.   Patient presents after mechanical fall this morning at home with left shoulder pain, left hip and left leg pain.  Patient denies any syncope, no head injury, no anticoagulant use.  No chest pain or shortness of breath or abdominal pain.  Pain with movement.  The history is provided by the patient.  Fall This is a new problem. Pertinent negatives include no chest pain, no abdominal pain, no headaches and no shortness of breath.       Prior to Admission medications   Medication Sig Start Date End Date Taking? Authorizing Provider  oxyCODONE-acetaminophen  (PERCOCET/ROXICET) 5-325 MG tablet Take 1 tablet by mouth every 4 (four) hours as needed for severe pain (pain score 7-10) or moderate pain (pain score 4-6). 12/18/23  Yes Tonia Chew, MD  acetaminophen  (TYLENOL ) 500 MG tablet Take 500 mg by mouth every 6 (six) hours as needed.    [provider]  acyclovir cream (ZOVIRAX) 5 % Apply topically as needed.    [provider]  ALPRAZolam  (XANAX ) 0.5 MG tablet Take 0.5 mg by mouth daily as needed. Uses when flying or occasional sleep. 06/03/20   [provider]  aspirin 81 MG EC tablet Take 162 mg by mouth daily. Swallow whole.    [provider]  Calcium -Vitamin D -Vitamin K (CALCIUM  SOFT CHEWS PO) Take 750 mg by mouth 2 (two) times daily.    [provider]  celecoxib (CELEBREX) 200 MG capsule Take 200 mg by mouth as needed. 01/20/21   [provider]  Cholecalciferol (VITAMIN D3) 50 MCG (2000 UT) CAPS Take by mouth 2 (two) times daily.    [provider]  Fexofenadine HCl (ALLEGRA PO) Take 1 tablet by mouth as needed.    [provider]  fluticasone (FLONASE) 50 MCG/ACT nasal spray Place into both nostrils as needed for  allergies or rhinitis.    [provider]  HM LIDOCAINE PATCH EX Apply topically.    [provider]  Magnesium 250 MG TABS Take 1 tablet by mouth at bedtime.    [provider]    Allergies: Sulfa antibiotics, Sulfonamide derivatives, Latex, and [facility] astringent eye drops [tetrahydrozoline-zn sulfate]    Review of Systems  Constitutional:  Negative for chills and fever.  HENT:  Negative for congestion.   Eyes:  Negative for visual disturbance.  Respiratory:  Negative for shortness of breath.   Cardiovascular:  Negative for chest pain.  Gastrointestinal:  Negative for abdominal pain and vomiting.  Genitourinary:  Negative for dysuria and flank pain.  Musculoskeletal:  Positive for gait problem and joint swelling. Negative for back pain, neck pain and neck stiffness.  Skin:  Negative for rash.  Neurological:  Negative for light-headedness and headaches.    Updated Vital Signs BP 105/68 (BP Location: Right Arm)   Pulse 83   Temp 97.8 F (36.6 C) (Oral)   Resp 14   Ht 5' 6 (1.676 m)   Wt 67.5 kg   SpO2 92%   BMI 24.02 kg/m   Physical Exam Vitals and nursing note reviewed.  Constitutional:      General: She is not in acute distress.    Appearance: She is well-developed.  HENT:     Head: Normocephalic and atraumatic.     Mouth/Throat:  Mouth: Mucous membranes are moist.  Eyes:     General:        Right eye: No discharge.        Left eye: No discharge.     Conjunctiva/sclera: Conjunctivae normal.  Neck:     Trachea: No tracheal deviation.  Cardiovascular:     Rate and Rhythm: Normal rate.  Pulmonary:     Effort: Pulmonary effort is normal.     Breath sounds: Normal breath sounds.  Abdominal:     General: There is no distension.     Palpations: Abdomen is soft.     Tenderness: There is no abdominal tenderness. There is no guarding.  Musculoskeletal:        General: Swelling and tenderness present.     Cervical back: Normal range of  motion and neck supple. No rigidity.     Comments: Patient has tenderness lateral left clavicle superior left shoulder all worse with flexion of the left upper arm.  No significant tenderness to the left elbow forearm or hand or wrist.  Patient has tenderness with flexion of the left hip and palpation of left proximal tibia with mild swelling.  Neurovascular intact.  No distal left leg tenderness and no femur tenderness to palpation.  Patient has equal strength bilateral.  Skin:    General: Skin is warm.     Capillary Refill: Capillary refill takes less than 2 seconds.     Findings: No rash.  Neurological:     General: No focal deficit present.     Mental Status: She is alert.     Cranial Nerves: No cranial nerve deficit.  Psychiatric:        Mood and Affect: Mood normal.     (all labs ordered are listed, but only abnormal results are displayed) Labs Reviewed  BASIC METABOLIC PANEL WITH GFR  CBC WITH DIFFERENTIAL/PLATELET    EKG: None  Radiology: DG Hip Unilat With Pelvis 2-3 Views Left Result Date: 12/18/2023 EXAM: 2 or 3 VIEW(S) XRAY OF THE LEFT HIP 12/18/2023 02:43:00 PM COMPARISON: None available. CLINICAL HISTORY: fall FINDINGS: BONES AND JOINTS: Nondisplaced fracture is noted at the junction of the left acetabulum and left superior pubic ramus. CT scan is recommended for further evaluation. No malalignment. SOFT TISSUES: The soft tissues are unremarkable. IMPRESSION: 1. Nondisplaced fracture at the junction of the left acetabulum and left superior pubic ramus. Recommend CT scan. Electronically signed by: Lynwood Seip MD 12/18/2023 03:12 PM EST RP Workstation: [id number]   DG FEMUR 1V LEFT Result Date: 12/18/2023 EXAM: 1 VIEW(S) XRAY OF THE LEFT FEMUR 12/18/2023 02:43:00 PM COMPARISON: None available. CLINICAL HISTORY: fall FINDINGS: BONES AND JOINTS: No acute fracture. No malalignment. SOFT TISSUES: The soft tissues are unremarkable. IMPRESSION: 1. No acute fracture or  dislocation. Electronically signed by: Lynwood Seip MD 12/18/2023 03:10 PM EST RP Workstation: [id number]   DG Tibia/Fibula Left Port Result Date: 12/18/2023 EXAM: VIEW(S) XRAY OF THE LEFT TIBIA AND FIBULA 12/18/2023 02:43:00 PM COMPARISON: None available. CLINICAL HISTORY: 809823 Fall 190176 FINDINGS: BONES AND JOINTS: Nondisplaced lateral tibial plateau fracture is noted. The fibula is unremarkable. No malalignment. SOFT TISSUES: The soft tissues are unremarkable. IMPRESSION: 1. Nondisplaced lateral tibial plateau fracture. Electronically signed by: Lynwood Seip MD 12/18/2023 03:09 PM EST RP Workstation: [id number]   DG Shoulder Left Port Result Date: 12/18/2023 EXAM: 1 VIEW(S) XRAY OF THE LEFT SHOULDER 12/18/2023 02:43:00 PM COMPARISON: None available. CLINICAL HISTORY: 809823 Fall 190176 FINDINGS: BONES AND JOINTS: Glenohumeral joint is normally aligned. Minimally  displaced distal left clavicular fracture is noted. No malalignment. The [facility] joint is unremarkable. SOFT TISSUES: No abnormal calcifications. Visualized lung is unremarkable. IMPRESSION: 1. Minimally displaced distal left clavicular fracture. Electronically signed by: Lynwood Seip MD 12/18/2023 03:07 PM EST RP Workstation: [id number]     Procedures   Medications Ordered in the ED  morphine  (PF) 4 MG/ML injection 4 mg (has no administration in time range)  ondansetron  (ZOFRAN ) injection 4 mg (has no administration in time range)  acetaminophen  (TYLENOL ) tablet 1,000 mg (1,000 mg Oral Given 12/18/23 1431)                                    Medical Decision Making Amount and/or Complexity of Data Reviewed Labs: ordered. Radiology: ordered.  Risk OTC drugs. Prescription drug management.   Patient presents after mechanical fall with multiple areas of injury in the left side.  X-rays ordered independent reviewed showing left clavicular fracture, left pelvic fracture and left proximal tibial fracture without displacement.  With  multiple fractures, age, significant pain CT scans ordered for further delineation of those fractures.  Blood work added.  Patient care signed out to follow-up results for final disposition and orthopedic consult.     Final diagnoses:  Closed displaced fracture of left clavicle, unspecified part of clavicle, initial encounter  Fall, initial encounter  Closed fracture of proximal end of left tibia, unspecified fracture morphology, initial encounter    ED Discharge Orders          Ordered    oxyCODONE-acetaminophen  (PERCOCET/ROXICET) 5-325 MG tablet  Every 4 hours PRN        12/18/23 1509               Marvel Sapp, MD 12/18/23 1536

## 2023-12-18 NOTE — Progress Notes (Signed)
 I was contacted regarding patient's fractures and patient transfer to trauma service at [name], and have reviewed radiographs. Full consult to follow in the AM. For now, please keep patient NPO after midnight.

## 2023-12-19 ENCOUNTER — Inpatient Hospital Stay (HOSPITAL_COMMUNITY)

## 2023-12-19 LAB — CBC
HCT: 37.9 % (ref 36.0–46.0)
Hemoglobin: 12.4 g/dL (ref 12.0–15.0)
MCH: 31.9 pg (ref 26.0–34.0)
MCHC: 32.7 g/dL (ref 30.0–36.0)
MCV: 97.4 fL (ref 80.0–100.0)
Platelets: 149 K/uL — ABNORMAL LOW (ref 150–400)
RBC: 3.89 MIL/uL (ref 3.87–5.11)
RDW: 12.4 % (ref 11.5–15.5)
WBC: 10.8 K/uL — ABNORMAL HIGH (ref 4.0–10.5)
nRBC: 0 % (ref 0.0–0.2)

## 2023-12-19 LAB — BASIC METABOLIC PANEL WITH GFR
Anion gap: 5 (ref 5–15)
BUN: 16 mg/dL (ref 8–23)
CO2: 25 mmol/L (ref 22–32)
Calcium: 8.3 mg/dL — ABNORMAL LOW (ref 8.9–10.3)
Chloride: 109 mmol/L (ref 98–111)
Creatinine, Ser: 0.65 mg/dL (ref 0.44–1.00)
GFR, Estimated: >60 mL/min (ref >60)
Glucose, Bld: 135 mg/dL — ABNORMAL HIGH (ref 70–99)
Potassium: 4 mmol/L (ref 3.5–5.1)
Sodium: 139 mmol/L (ref 135–145)

## 2023-12-19 MED ORDER — LIDOCAINE 5 % EX PTCH
2.0000 | MEDICATED_PATCH | CUTANEOUS | Status: DC
  Administered 2023-12-19 – 2023-12-20 (×2): 2 via TRANSDERMAL
  Administered 2023-12-22: 1 via TRANSDERMAL
  Administered 2023-12-23 – 2024-01-03 (×12): 2 via TRANSDERMAL
  Filled 2023-12-19 (×16): qty 2

## 2023-12-19 NOTE — ED Notes (Signed)
 Pt noted with poor appetite. Pt educated on importance of good protein intake to promote healing. Pt requesting chocolate ensure at this time. Provider notified of pt request.

## 2023-12-19 NOTE — Consult Note (Signed)
 Orthopaedic Trauma Service (OTS) Consult   Patient ID: Margaret English MRN: [id number] DOB/AGE: 78-Oct-1947 78 y.o.  Reason for Consult:Multiple fracture Referring Physician: Dr. Oneil Priestly, MD [facility]  HPI: Margaret English is an 78 y.o. female who is being seen in consultation at the request of Dr. Priestly for evaluation of multiple orthopedic injuries.  Patient fell at home in her garage she presented to the emergency room where x-rays showed multiple fractures.  She was found to have a left clavicle fracture multiple rib fractures and pelvic fracture and a left tibial plateau fracture.  Due to the multiple orthopedic injuries Dr. Priestly reached out to me for expertise in terms of the orthopedic traumatology.  Patient was seen and evaluated in the emergency room.  Currently overall comfortable.  She is getting her knee immobilizer placed.  She does note that she feels clicking in her shoulder anytime she moves it.  Is currently comfortable in the sling.  She lives at home with her husband.  She ambulates without assist device.  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 She has never used smokeless tobacco. She reports current alcohol use. She reports that she does not use drugs.  Allergies: Allergies[1]  Medications: Medications Ordered Prior to  Encounter[2]   ROS: As noted above  Exam: Blood pressure (!) 98/56, pulse 92, temperature 98.2 F (36.8 C), temperature source Oral, resp. rate (!) 22, height 5' 6 (1.676 m), weight 67.5 kg, SpO2 97%. General: No acute distress Orientation: Awake alert and oriented x 3 Mood and Affect: Cooperative and pleasant Gait: Unable to assess due to her fractures Coordination and balance: Within normal limits  Injured Extremity (CV, lymph, sensation, reflexes): Left upper extremity: Sling is in place is clean dry and intact.  She has tenderness through the clavicle.  She has notable crepitus as well.  She is neurovascularly intact otherwise to the upper extremity.  Left lower extremity: Reveals a moderate-sized effusion.  No significant valgus alignment in the knee.  She has a fully straight currently.  I did not bend her today compartments are soft compressible.  She has intact motor and sensory function distally.    Right lower extremity:Skin without lesions. No tenderness to palpation. Full painless ROM, full strength in each muscle groups without evidence of instability.  Medical Decision Making: Data: Imaging: X-rays and CT scan of the left knee show a posterior lateral tibial plateau fracture.  There is some mild impaction but anterior joint looks pretty good.  No significant medial joint involvement.  2 views of the left shoulder show a minimally displaced clavicle fracture.  Hard to visualize fully on the CT scan as well.  CT scan of the pelvis shows zone 1 sacral fracture consistent with a LC pelvic ring injury.  There is a high superior pubic ramus fracture that is nondisplaced.  Labs:  Results for orders placed or performed during the hospital encounter of 12/18/23 (from the past 24 hours)  Basic metabolic panel     Status: Abnormal   Collection Time: 12/18/23  3:33 PM  Result Value Ref Range   Sodium 141 135 - 145 mmol/L   Potassium 3.8 3.5 - 5.1 mmol/L   Chloride 104 98 - 111  mmol/L   CO2 26 22 - 32 mmol/L   Glucose, Bld 141 (H) 70 - 99 mg/dL   BUN 16 8 - 23 mg/dL   Creatinine, Ser 9.42 0.44 - 1.00 mg/dL   Calcium  9.7 8.9 - 10.3 mg/dL   GFR, Estimated >39 >39 mL/min   Anion gap 11 5 - 15  CBC with Differential     Status: Abnormal   Collection Time: 12/18/23  3:33 PM  Result Value Ref Range   WBC 16.0 (H) 4.0 - 10.5 K/uL   RBC 4.27 3.87 - 5.11 MIL/uL   Hemoglobin 13.7 12.0 - 15.0 g/dL   HCT 60.3 63.9 - 53.9 %   MCV 92.7 80.0 - 100.0 fL   MCH 32.1 26.0 - 34.0 pg   MCHC 34.6 30.0 - 36.0 g/dL   RDW 87.8 88.4 - 84.4 %   Platelets 178 150 - 400 K/uL   nRBC 0.0 0.0 - 0.2 %   Neutrophils Relative % 90 %   Neutro Abs 14.4 (H) 1.7 - 7.7 K/uL   Lymphocytes Relative 5 %   Lymphs Abs 0.8 0.7 - 4.0 K/uL   Monocytes Relative 4 %   Monocytes Absolute 0.6 0.1 - 1.0 K/uL   Eosinophils Relative 0 %   Eosinophils Absolute 0.0 0.0 - 0.5 K/uL   Basophils Relative 0 %   Basophils Absolute 0.0 0.0 - 0.1 K/uL   Immature Granulocytes 1 %   Abs Immature Granulocytes 0.15 (H) 0.00 - 0.07 K/uL  CBC     Status: Abnormal   Collection Time: 12/19/23  4:10 AM  Result Value Ref Range   WBC 10.8 (H) 4.0 - 10.5 K/uL   RBC 3.89 3.87 - 5.11 MIL/uL   Hemoglobin 12.4 12.0 - 15.0 g/dL   HCT 62.0 63.9 - 53.9 %   MCV 97.4 80.0 - 100.0 fL   MCH 31.9 26.0 - 34.0 pg   MCHC 32.7 30.0 - 36.0 g/dL   RDW 87.5 88.4 - 84.4 %   Platelets 149 (L) 150 - 400 K/uL   nRBC 0.0 0.0 - 0.2 %  Basic metabolic panel     Status: Abnormal   Collection Time: 12/19/23  4:10 AM  Result Value Ref Range   Sodium 139 135 - 145 mmol/L   Potassium 4.0 3.5 - 5.1 mmol/L   Chloride 109 98 - 111 mmol/L   CO2 25 22 - 32 mmol/L   Glucose, Bld 135 (H) 70 - 99 mg/dL   BUN 16 8 - 23 mg/dL   Creatinine, Ser 9.34 0.44 - 1.00 mg/dL   Calcium  8.3 (L) 8.9 - 10.3 mg/dL   GFR, Estimated >39 >39 mL/min   Anion gap 5 5 - 15     Imaging or Labs ordered: Upright shoulder films  Medical history and chart was reviewed  and case discussed with medical provider.  Assessment/Plan:  78 year old female with left clavicle fracture, left lateral compression pelvic ring injury, and left lateral tibial plateau fracture.  I believe that her pelvic ring injury and her tibial plateau fracture will do well nonoperatively.  There is minimal displacement and this displacement on the plateau is in the posterior lateral aspect which would not affect weightbearing.  We will continue with then knee immobilization.  She can touchdown weight-bear on that side.  In regards to her left shoulder I will get upright clavicle films to see if there is any displacement.  If there is some displacement we could consider fixation to provide weightbearing and quicker mobilization for the shoulder.  Will plan to proceed with nonoperative intervention for now and we will review the films later today if there is indication for surgery will make her n.p.o. after midnight.  New problem w/ workup planned: High complexity diagnosis (Level 5)  Franky MYRTIS Light, MD [facility] [phone] (office) [email or web address]      [1]  Allergies Allergen Reactions   Sulfa Antibiotics Other (See Comments)    Drainage and red eyes only allergic to sulfa eye drops   Sulfonamide Derivatives Other (See Comments)    Eye drops only   Latex Itching and Dermatitis    Latex bandages   [facility] Astringent Eye Drops [Tetrahydrozoline-Zn Sulfate] Other (See Comments)  [2]  No current facility-administered medications on file prior to encounter.   Current Outpatient Medications on File Prior to Encounter  Medication Sig Dispense Refill   acetaminophen  (TYLENOL ) 500 MG tablet Take 500 mg by mouth every 6 (six) hours as needed.     acyclovir (ZOVIRAX) 200 MG capsule Take 200-600 mg by mouth See admin instructions. 400 mg in the morning and 600 in the evening if she has a break out     ALPRAZolam  (XANAX ) 0.5 MG tablet Take 0.5 mg by mouth daily as  needed. Uses when flying or occasional sleep.     aspirin 81 MG EC tablet Take 162 mg by mouth at bedtime. Swallow whole.     Calcium -Vitamin D -Vitamin K (CALCIUM  SOFT CHEWS PO) Take 750 mg by mouth daily as needed (bone).     celecoxib (CELEBREX) 200 MG capsule Take 200 mg by mouth as needed.     Cholecalciferol (VITAMIN D3) 50 MCG (2000 UT) CAPS Take by mouth 2 (two) times daily.     Elderberry 500 MG CAPS Take 1 capsule by mouth daily.     fluticasone (FLONASE) 50 MCG/ACT nasal spray Place 2 sprays into both nostrils as needed for allergies or rhinitis.     Magnesium 250 MG TABS Take 1 tablet by mouth 3 (three) times a week.     OVER THE COUNTER MEDICATION Take 1 tablet by mouth daily. WEEM supplement

## 2023-12-19 NOTE — Progress Notes (Signed)
 Trauma Event Note    Seen on TRN rounds. Pt complaining of fatigue, fever, states it hurts when she moves. Temp 101.8 oral on my assessment. Noted tylenol  overdue on [facility], given by this RN. Increased O2 to 3L after noting SpO2 92% on 2L - up to 96%. Provided IS, none in room - pulled 500 for me. [employer] completed  Last imported Vital Signs BP 103/60   Pulse (!) 112   Temp (!) 101.8 F (38.8 C) (Oral)   Resp (!) 21   Ht 5' 6 (1.676 m)   Wt 148 lb 13 oz (67.5 kg)   SpO2 92%   BMI 24.02 kg/m   Trending CBC Recent Labs    12/18/23 1533 12/19/23 0410  WBC 16.0* 10.8*  HGB 13.7 12.4  HCT 39.6 37.9  PLT 178 149*    Trending Coag's No results for input(s): APTT, INR in the last 72 hours.  Trending BMET Recent Labs    12/18/23 1533 12/19/23 0410  NA 141 139  K 3.8 4.0  CL 104 109  CO2 26 25  BUN 16 16  CREATININE 0.57 0.65  GLUCOSE 141* 135*      Margaret English O Margaret English  Trauma Response RN  Please call TRN at [phone] for further assistance.

## 2023-12-19 NOTE — Progress Notes (Signed)
 Orthopedic Tech Progress Note Patient Details:  Margaret English October 20, 1945 [id number]  Ortho Devices Type of Ortho Device: Knee Immobilizer Ortho Device/Splint Location: LLE Ortho Device/Splint Interventions: Ordered, Application   Post Interventions Patient Tolerated: Well  [name] [name] 12/19/2023, 11:35 AM

## 2023-12-19 NOTE — Consult Note (Signed)
 Orthopaedic Consultation   Margaret English  FMW:[id number]  DOB: [birth year]  DOA: 12/18/2023  PCP: Aisha Harvey, MD  Requesting physician: Rolan Quale, DO, ED Provider  Reason for consultation: Mechanical Fall resulting in various fractures  History of Present Illness: Margaret English is an 78 y.o. female who fell at home in her garage yesterday morning. She presented to [facility] ED and was transferred to [facility]. She denies pain when at rest, but with any movement endorses pain throughout her left side at the shoulder, ribs, hip, and knee. Her husband accompanies her and helps provide some history. She denies SOB/N/V. She denies any numbness or changes in temperature. She states her pain is well controlled with the medications. She has not eaten as there was concern for possible surgical intervention being required for her fractures.    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 She has never used smokeless tobacco. She reports current alcohol use. She reports that she does not use drugs.   Family History: Family History  Problem Relation Age of Onset   Colon cancer Neg Hx    Esophageal cancer Neg Hx    Rectal cancer Neg Hx    Stomach cancer Neg Hx    Physical Exam: Vitals:   12/19/23 0730  12/19/23 0820 12/19/23 0821 12/19/23 0940  BP:   (!) 98/56   Pulse: 94  92   Resp: 20  (!) 22   Temp:  98.2 F (36.8 C)  98.2 F (36.8 C)  TempSrc:  Oral  Oral  SpO2: 96%  97%   Weight:      Height:        Constitutional: Pt resting comfortably in bed,  Alert and awake, oriented x3, not in any acute distress. Eyes: PERLA, EOMI, irises appear normal, anicteric sclera,  ENMT: external ears and nose appear normal, hard of hearing            Lips appears normal Musculoskeletal: no cyanosis, clubbing or edema noted bilaterally, 2+ DPP = BIL, 2+ DRP = BIL, sling in place on LUE, short knee immobilizer in place L knee. TTP left ribs. LUE and LLE well perfused with soft distal compartments.  Psych: judgement and insight appear normal, stable mood and affect, mental status  Labs:  CBC: Recent Labs  Lab 12/18/23 1533 12/19/23 0410  WBC 16.0* 10.8*  NEUTROABS 14.4*  --   HGB 13.7 12.4  HCT 39.6 37.9  MCV 92.7 97.4  PLT 178 149*    Basic Metabolic Panel: Recent Labs  Lab 12/18/23 1533 12/19/23 0410  NA 141 139  K 3.8 4.0  CL 104 109  CO2 26 25  GLUCOSE 141* 135*  BUN 16 16  CREATININE 0.57 0.65  CALCIUM  9.7 8.3*   Urinalysis    Component Value Date/Time   COLORURINE YELLOW 09/08/2009 1140   APPEARANCEUR CLEAR 09/08/2009 1140   LABSPEC 1.019 09/08/2009 1140   PHURINE 7.5 09/08/2009 1140   GLUCOSEU NEGATIVE 09/08/2009 1140   HGBUR NEGATIVE 09/08/2009 1140   BILIRUBINUR NEGATIVE 09/08/2009 1140   KETONESUR NEGATIVE 09/08/2009 1140   PROTEINUR NEGATIVE 09/08/2009 1140   UROBILINOGEN 0.2 09/08/2009 1140   NITRITE NEGATIVE 09/08/2009 1140   LEUKOCYTESUR  09/08/2009 1140    NEGATIVE MICROSCOPIC NOT DONE ON URINES WITH NEGATIVE PROTEIN, BLOOD, LEUKOCYTES, NITRITE, OR GLUCOSE <1000 mg/dL.   Sepsis Labs Recent Labs  Lab 12/18/23 1533 12/19/23 0410  WBC 16.0* 10.8*    Inpatient Medications:   Scheduled Meds:  acetaminophen   1,000 mg Oral Q6H   docusate sodium    100 mg Oral BID   enoxaparin (LOVENOX) injection  30 mg Subcutaneous Q12H   gabapentin   300 mg Oral TID   lidocaine  2 patch Transdermal Q24H   methocarbamol   500 mg Oral Q8H   Or   methocarbamol  (ROBAXIN ) injection  500 mg Intravenous Q8H    Radiological Exams on Admission:  DG CHEST PORT 1 VIEW Result Date: 12/19/2023 EXAM: XR Chest, 1 View. CLINICAL HISTORY: 78 year old female with rib fractures, pneumothorax. COMPARISON: 12/18/2023. Colon CT chest, abdomen, and pelvis 12/18/2023. FINDINGS: LUNGS: Bibasilar curvilinear opacities. Ongoing low lung volumes. Streaky lower lung peribronchial opacity bilaterally most resembles atelectasis. No pulmonary edema. PLEURAL SPACES: Pneumothorax identified.  Small pleural effusions difficult to exclude. HEART: The heart size is normal. SOFT TISSUES: Surgical clips in left axilla and left breast. Breast implants are less apparent on these images. BONES: More kyphotic positioning. Left rib fractures better demonstrated by CT. IMPRESSION: 1. No Pneumothorax identified. 2. Low lung volumes. Bibasilar opacities, favor atelectasis. Small pleural effusions difficult to exclude. Electronically signed by: Helayne Hurst MD 12/19/2023 07:34 AM EST RP Workstation: [id number]   DG Chest Port 1 View Result Date: 12/18/2023 EXAM: 1 VIEW(S) XRAY OF THE CHEST 12/18/2023 10:29:36 PM COMPARISON: None available. CLINICAL HISTORY: Pneumothorax FINDINGS: LUNGS AND PLEURA: Low lung volumes. No radiographic evidence of significant pneumothorax. Limited evaluation of the lung apices due to patient positioning. Interval development of coarsened interstitial markings with question of developing pulmonary edema. No pleural effusion. HEART AND MEDIASTINUM: Atherosclerotic plaque. No acute abnormality of the cardiac and mediastinal silhouettes. BONES AND SOFT TISSUES: Surgical clips in left axilla and left breast. No acute osseous abnormality. IMPRESSION: 1. No radiographic evidence of  significant pneumothorax. 2. Interval development of coarsened interstitial markings with question of developing pulmonary edema. 3. Recommend repeat PA and lateral view of the chest with improved patient positioning for further evaluation. Electronically signed by: Morgane Naveau MD 12/18/2023 10:45 PM EST RP Workstation: [id number]   CT Cervical Spine Wo Contrast Result Date: 12/18/2023 CLINICAL DATA:  Neck trauma mechanical fall EXAM: CT CERVICAL SPINE WITHOUT CONTRAST TECHNIQUE: Multidetector CT imaging of the cervical spine was performed without intravenous contrast. Multiplanar CT image reconstructions were also generated. RADIATION DOSE REDUCTION: This exam was performed according to the departmental dose-optimization program which includes automated exposure control, adjustment of the mA and/or kV according to patient size and/or use of iterative reconstruction technique. COMPARISON:  None Available. FINDINGS: Alignment: Mild reversal of cervical lordosis. Trace anterolisthesis C3 on C4, C4 on C5 and C7 on T1. Facet alignment is normal Skull  base and vertebrae: No acute fracture. No primary bone lesion or focal pathologic process. Soft tissues and spinal canal: No prevertebral fluid or swelling. No visible canal hematoma. Disc levels: None multilevel degenerative changes. Moderate disc space narrowing C2-C3, C4-C5, C5-C6 and C6-C7. Multilevel facet degenerative changes. Left greater than right multilevel foraminal narrowing Upper chest: Lung apices are clear. 1.7 cm hypodense right thyroid nodule increased compared to prior. Other: None IMPRESSION: 1. Mild reversal of cervical lordosis with multilevel degenerative changes. No acute osseous abnormality. 2. 1.7 cm hypodense right thyroid nodule. Recommend non emergent thyroid ultrasound evaluation Electronically Signed   By: Luke Bun M.D.   On: 12/18/2023 18:13   CT Head Wo Contrast Result Date: 12/18/2023 CLINICAL DATA:  Head trauma mechanical  fall EXAM: CT HEAD WITHOUT CONTRAST TECHNIQUE: Contiguous axial images were obtained from the base of the skull through the vertex without intravenous contrast. RADIATION DOSE REDUCTION: This exam was performed according to the departmental dose-optimization program which includes automated exposure control, adjustment of the mA and/or kV according to patient size and/or use of iterative reconstruction technique. COMPARISON:  CT brain report 06/24/2007 FINDINGS: Brain: No acute territorial infarction, hemorrhage or intracranial mass. Mild atrophy. No ventricular enlargement. Vascular: No hyperdense vessels.  Carotid vascular calcification Skull: Normal. Negative for fracture or focal lesion. Sinuses/Orbits: Mild mucosal thickening in the sinuses Other: None IMPRESSION: No CT evidence for acute intracranial abnormality. Mild atrophy. Electronically Signed   By: Luke Bun M.D.   On: 12/18/2023 18:08   CT Knee Left Wo Contrast Result Date: 12/18/2023 CLINICAL DATA:  Fall.  Lateral tibial plateau fracture. EXAM: CT OF THE LEFT KNEE WITHOUT CONTRAST TECHNIQUE: Multidetector CT imaging of the left knee was performed according to the standard protocol. Multiplanar CT image reconstructions were also generated. RADIATION DOSE REDUCTION: This exam was performed according to the departmental dose-optimization program which includes automated exposure control, adjustment of the mA and/or kV according to patient size and/or use of iterative reconstruction technique. COMPARISON:  Earlier same day radiographs of the left tibia/fibula. FINDINGS: Bones/Joint/Cartilage Comminuted fracture of the lateral tibial plateau with minimal medial displacement anteriorly measuring less than 2 mm. There is mild articular surface depression measuring approximately 1-2 mm. Moderate volume lipohemarthrosis. No additional fracture identified. No dislocation. Mild medial femorotibial compartment joint space narrowing. Ligaments Ligaments are  suboptimally evaluated by CT. Muscles and Tendons Muscles are within normal limits. The quadriceps and patellar tendons are intact. Soft tissue No fluid collection or hematoma. IMPRESSION: 1. Comminuted fracture of the lateral tibial plateau with mild articular surface depression measuring 1-2 mm (Schatzker type II). 2. Moderate volume lipohemarthrosis. Electronically Signed   By: Harrietta Sherry M.D.   On: 12/18/2023 16:28   CT CHEST ABDOMEN PELVIS WO CONTRAST Result Date: 12/18/2023 CLINICAL DATA:  Fall, pelvic fractures. Left shoulder pain and left hip pain. EXAM: CT CHEST, ABDOMEN AND PELVIS WITHOUT CONTRAST TECHNIQUE: Multidetector CT imaging of the chest, abdomen and pelvis was performed following the standard protocol without IV contrast. RADIATION DOSE REDUCTION: This exam was performed according to the departmental dose-optimization program which includes automated exposure control, adjustment of the mA and/or kV according to patient size and/or use of iterative reconstruction technique. COMPARISON:  CT chest 07/23/2017.  CT abdomen pelvis 12/15/2015. FINDINGS: CT CHEST FINDINGS Cardiovascular: Atherosclerotic calcification of the aorta. Heart size normal. No pericardial effusion. No pericardial effusion. Mediastinum/Nodes: No pathologically enlarged mediastinal or axillary lymph nodes. Hilar regions are difficult to definitively evaluate without IV contrast. Surgical clips in  the axillary regions. Esophagus is grossly unremarkable. Lungs/Pleura: Focal rounded mucoid impaction in the medial right lower lobe (3/120), unchanged. Bibasilar scarring and volume loss. Trace medial pleural air in the upper left hemithorax. No pleural fluid. Airway is unremarkable. Musculoskeletal: Nondisplaced or minimally displaced anterolateral left third, fourth, fifth, sixth and seventh rib fractures. Minimally displaced fracture of the lateral left clavicle. Osteopenia. T12 compression fracture, age indeterminate. CT  ABDOMEN PELVIS FINDINGS Hepatobiliary: Patient's left hand and arm create streak artifact in the upper abdomen, degrading image quality. Liver and gallbladder are grossly unremarkable. No biliary ductal dilatation. Pancreas: Negative. Spleen: Negative. Adrenals/Urinary Tract: Adrenal glands and right kidney are unremarkable. Small left renal stone. Ureters are decompressed. Bladder is grossly unremarkable. Stomach/Bowel: Stomach, small bowel and colon are unremarkable. Appendix is not visualized. Vascular/Lymphatic: Atherosclerotic calcification of the aorta. No pathologically enlarged lymph nodes. Reproductive: Hysterectomy.  No adnexal mass. Other: Hemorrhagic stranding between the bladder and left acetabulum. Otherwise, no free fluid. No free air. Mesenteries and peritoneum are unremarkable. Musculoskeletal: Nondisplaced left superior and inferior pubic rami fractures with extension to the anterior left acetabulum. Probable nondisplaced left pubic bone fracture. Nondisplaced right inferior pubic ramus fracture. Osteopenia. Dextroconvex scoliosis of the lumbar spine. IMPRESSION: 1. Multiple nondisplaced or minimally displaced left anterolateral rib fractures with the tiniest medial pneumothorax, likely clinically insignificant. 2. Lateral left clavicle fracture. 3. Left superior and inferior pubic rami and left pubic bone fractures with associated hematoma in the medial soft tissues. Left superior pubic ramus fracture appears to involve the anterior left acetabulum. Probable nondisplaced right inferior pubic ramus fracture. 4. T12 compression fracture, age indeterminate. 5. Small left renal stone. 6.  Aortic atherosclerosis (ICD10-I70.0). Electronically Signed   By: Newell Eke M.D.   On: 12/18/2023 16:22   DG Hip Unilat With Pelvis 2-3 Views Left Result Date: 12/18/2023 EXAM: 2 or 3 VIEW(S) XRAY OF THE LEFT HIP 12/18/2023 02:43:00 PM COMPARISON: None available. CLINICAL HISTORY: fall FINDINGS: BONES AND  JOINTS: Nondisplaced fracture is noted at the junction of the left acetabulum and left superior pubic ramus. CT scan is recommended for further evaluation. No malalignment. SOFT TISSUES: The soft tissues are unremarkable. IMPRESSION: 1. Nondisplaced fracture at the junction of the left acetabulum and left superior pubic ramus. Recommend CT scan. Electronically signed by: Lynwood Seip MD 12/18/2023 03:12 PM EST RP Workstation: [id number]   DG FEMUR 1V LEFT Result Date: 12/18/2023 EXAM: 1 VIEW(S) XRAY OF THE LEFT FEMUR 12/18/2023 02:43:00 PM COMPARISON: None available. CLINICAL HISTORY: fall FINDINGS: BONES AND JOINTS: No acute fracture. No malalignment. SOFT TISSUES: The soft tissues are unremarkable. IMPRESSION: 1. No acute fracture or dislocation. Electronically signed by: Lynwood Seip MD 12/18/2023 03:10 PM EST RP Workstation: [id number]   DG Tibia/Fibula Left Port Result Date: 12/18/2023 EXAM: VIEW(S) XRAY OF THE LEFT TIBIA AND FIBULA 12/18/2023 02:43:00 PM COMPARISON: None available. CLINICAL HISTORY: [id number] Fall [id number] FINDINGS: BONES AND JOINTS: Nondisplaced lateral tibial plateau fracture is noted. The fibula is unremarkable. No malalignment. SOFT TISSUES: The soft tissues are unremarkable. IMPRESSION: 1. Nondisplaced lateral tibial plateau fracture. Electronically signed by: Lynwood Seip MD 12/18/2023 03:09 PM EST RP Workstation: [id number]   DG Shoulder Left Port Result Date: 12/18/2023 EXAM: 1 VIEW(S) XRAY OF THE LEFT SHOULDER 12/18/2023 02:43:00 PM COMPARISON: None available. CLINICAL HISTORY: [id number] Fall [id number] FINDINGS: BONES AND JOINTS: Glenohumeral joint is normally aligned. Minimally displaced distal left clavicular fracture is noted. No malalignment. [facility] joint is unremarkable. SOFT TISSUES: No abnormal calcifications. Visualized lung is unremarkable. IMPRESSION:  1. Minimally displaced distal left clavicular fracture. Electronically signed by: Lynwood Seip MD 12/18/2023 03:07 PM EST RP  Workstation: [id number]    Assessment/Plan:  Left clavicle fx: Maintain sling, NWB LUE, non-surgical, F/U Dr Kendal 1wk Left pubic rami, pubic bone, and right inf pubic ramus fx's non-operative, WBAT. F/U Dr Kendal 1 week Left tibial plateau fx: likely non-surgical per trauma team, TDWB LLE, requires long knee immobilizer to be worn at all times, can open up for icing when seated/laying PRN. F/U Dr Kendal 1 week Pt can eat, no surgical interventions planned at this time, NPO order D/C'd Currently on lovenox, cont anticoagulation per trauma team  Initiate PT/OT, pt and husband desire to progress home as soon as possible and state they have good local support with friends/family  Chronic T 12 comp fx from 3-4 years ago Left 3-7 Rib fractures, followed by trauma, non-op, px control  Left trace PTX followed by trauma  Pt can pursue D/C with outpt F/U Dr Kendal once cleared by PT/OT and trauma team      Ileana JINNY Clara. PA-C 12/19/2023, 10:18 AM

## 2023-12-19 NOTE — ED Notes (Signed)
Ortho tech called to place knee immobilizer 

## 2023-12-19 NOTE — Progress Notes (Signed)
 Progress Note     Subjective: Pt reports no pain at rest but pain throughout left side with trying to move. She denies significant SOB. She denies abdominal pain, nausea or vomiting. She lives with her husband and granddaughter in a one story home with a step to enter. She reports other children and grandchildren locally who would be able to help her.   Objective: Vital signs in last 24 hours: Temp:  [97.8 F (36.6 C)-98.9 F (37.2 C)] 98.2 F (36.8 C) (12/11 0820) Pulse Rate:  [83-114] 92 (12/11 0821) Resp:  [10-25] 22 (12/11 0821) BP: (95-147)/(56-84) 98/56 (12/11 0821) SpO2:  [84 %-100 %] 97 % (12/11 0821) Weight:  [67.5 kg] 67.5 kg (12/10 1339)    Intake/Output from previous day: 12/10 0701 - 12/11 0700 In: 77.3 [I.V.:77.3] Out: -  Intake/Output this shift: No intake/output data recorded.  PE: General: pleasant, WD, WN female who is laying in bed in NAD HEENT: head is normocephalic, atraumatic.  Sclera are noninjected.  EOMI.  Ears and nose without any masses or lesions.  Mouth is pink and mois Heart: regular, rate, and rhythm.  Palpable radial and pedal pulses bilaterally Lungs: CTAB, no wheezes, rhonchi, or rales noted.  Respiratory effort nonlabored on 2L [place] Abd: soft, NT, ND, no masses, hernias, or organomegaly MS: LUE in sling, L hand WWP and NVI; L knee in small KI, L foot WWP; RLE WWP  Skin: warm and dry with no masses, lesions, or rashes Neuro: Cranial nerves 2-12 grossly intact, sensation is normal throughout Psych: A&Ox3 with an appropriate affect.    Lab Results:  Recent Labs    12/18/23 1533 12/19/23 0410  WBC 16.0* 10.8*  HGB 13.7 12.4  HCT 39.6 37.9  PLT 178 149*   BMET Recent Labs    12/18/23 1533 12/19/23 0410  NA 141 139  K 3.8 4.0  CL 104 109  CO2 26 25  GLUCOSE 141* 135*  BUN 16 16  CREATININE 0.57 0.65  CALCIUM  9.7 8.3*   PT/INR No results for input(s): LABPROT, INR in the last 72 hours. CMP     Component Value  Date/Time   NA 139 12/19/2023 0410   NA 147 (H) 12/18/2016 1125   NA 143 12/14/2015 0932   K 4.0 12/19/2023 0410   K 4.2 12/18/2016 1125   K 4.3 12/14/2015 0932   CL 109 12/19/2023 0410   CL 103 12/18/2016 1125   CO2 25 12/19/2023 0410   CO2 30 12/18/2016 1125   CO2 27 12/14/2015 0932   GLUCOSE 135 (H) 12/19/2023 0410   GLUCOSE 101 12/18/2016 1125   BUN 16 12/19/2023 0410   BUN 11 12/18/2016 1125   BUN 10.2 12/14/2015 0932   CREATININE 0.65 12/19/2023 0410   CREATININE 0.71 12/04/2023 1033   CREATININE 0.9 12/18/2016 1125   CREATININE 0.8 12/14/2015 0932   CALCIUM  8.3 (L) 12/19/2023 0410   CALCIUM  10.3 12/18/2016 1125   CALCIUM  10.1 12/14/2015 0932   PROT 6.8 12/04/2023 1033   PROT 6.9 12/18/2016 1125   PROT 6.8 12/14/2015 0932   ALBUMIN 4.3 12/04/2023 1033   ALBUMIN 3.8 12/18/2016 1125   ALBUMIN 3.7 12/14/2015 0932   AST 22 12/04/2023 1033   AST 32 12/14/2015 0932   ALT 16 12/04/2023 1033   ALT 32 12/18/2016 1125   ALT 35 12/14/2015 0932   ALKPHOS 93 12/04/2023 1033   ALKPHOS 73 12/18/2016 1125   ALKPHOS 75 12/14/2015 0932   BILITOT 0.7 12/04/2023 1033  BILITOT 0.82 12/14/2015 0932   GFRNONAA >60 12/19/2023 0410   GFRNONAA >60 12/04/2023 1033   GFRAA >60 07/09/2019 1144   Lipase  No results found for: LIPASE     Studies/Results: DG CHEST PORT 1 VIEW Result Date: 12/19/2023 EXAM: XR Chest, 1 View. CLINICAL HISTORY: 78 year old female with rib fractures, pneumothorax. COMPARISON: 12/18/2023. Colon CT chest, abdomen, and pelvis 12/18/2023. FINDINGS: LUNGS: Bibasilar curvilinear opacities. Ongoing low lung volumes. Streaky lower lung peribronchial opacity bilaterally most resembles atelectasis. No pulmonary edema. PLEURAL SPACES: Pneumothorax identified.  Small pleural effusions difficult to exclude. HEART: The heart size is normal. SOFT TISSUES: Surgical clips in left axilla and left breast. Breast implants are less apparent on these images. BONES: More kyphotic  positioning. Left rib fractures better demonstrated by CT. IMPRESSION: 1. No Pneumothorax identified. 2. Low lung volumes. Bibasilar opacities, favor atelectasis. Small pleural effusions difficult to exclude. Electronically signed by: Helayne Hurst MD 12/19/2023 07:34 AM EST RP Workstation: [id number]   DG Chest Port 1 View Result Date: 12/18/2023 EXAM: 1 VIEW(S) XRAY OF THE CHEST 12/18/2023 10:29:36 PM COMPARISON: None available. CLINICAL HISTORY: Pneumothorax FINDINGS: LUNGS AND PLEURA: Low lung volumes. No radiographic evidence of significant pneumothorax. Limited evaluation of the lung apices due to patient positioning. Interval development of coarsened interstitial markings with question of developing pulmonary edema. No pleural effusion. HEART AND MEDIASTINUM: Atherosclerotic plaque. No acute abnormality of the cardiac and mediastinal silhouettes. BONES AND SOFT TISSUES: Surgical clips in left axilla and left breast. No acute osseous abnormality. IMPRESSION: 1. No radiographic evidence of significant pneumothorax. 2. Interval development of coarsened interstitial markings with question of developing pulmonary edema. 3. Recommend repeat PA and lateral view of the chest with improved patient positioning for further evaluation. Electronically signed by: Morgane Naveau MD 12/18/2023 10:45 PM EST RP Workstation: [id number]   CT Cervical Spine Wo Contrast Result Date: 12/18/2023 CLINICAL DATA:  Neck trauma mechanical fall EXAM: CT CERVICAL SPINE WITHOUT CONTRAST TECHNIQUE: Multidetector CT imaging of the cervical spine was performed without intravenous contrast. Multiplanar CT image reconstructions were also generated. RADIATION DOSE REDUCTION: This exam was performed according to the departmental dose-optimization program which includes automated exposure control, adjustment of the mA and/or kV according to patient size and/or use of iterative reconstruction technique. COMPARISON:  None Available. FINDINGS:  Alignment: Mild reversal of cervical lordosis. Trace anterolisthesis C3 on C4, C4 on C5 and C7 on T1. Facet alignment is normal Skull base and vertebrae: No acute fracture. No primary bone lesion or focal pathologic process. Soft tissues and spinal canal: No prevertebral fluid or swelling. No visible canal hematoma. Disc levels: None multilevel degenerative changes. Moderate disc space narrowing C2-C3, C4-C5, C5-C6 and C6-C7. Multilevel facet degenerative changes. Left greater than right multilevel foraminal narrowing Upper chest: Lung apices are clear. 1.7 cm hypodense right thyroid nodule increased compared to prior. Other: None IMPRESSION: 1. Mild reversal of cervical lordosis with multilevel degenerative changes. No acute osseous abnormality. 2. 1.7 cm hypodense right thyroid nodule. Recommend non emergent thyroid ultrasound evaluation Electronically Signed   By: Luke Bun M.D.   On: 12/18/2023 18:13   CT Head Wo Contrast Result Date: 12/18/2023 CLINICAL DATA:  Head trauma mechanical fall EXAM: CT HEAD WITHOUT CONTRAST TECHNIQUE: Contiguous axial images were obtained from the base of the skull through the vertex without intravenous contrast. RADIATION DOSE REDUCTION: This exam was performed according to the departmental dose-optimization program which includes automated exposure control, adjustment of the mA and/or kV according to patient size and/or use  of iterative reconstruction technique. COMPARISON:  CT brain report 06/24/2007 FINDINGS: Brain: No acute territorial infarction, hemorrhage or intracranial mass. Mild atrophy. No ventricular enlargement. Vascular: No hyperdense vessels.  Carotid vascular calcification Skull: Normal. Negative for fracture or focal lesion. Sinuses/Orbits: Mild mucosal thickening in the sinuses Other: None IMPRESSION: No CT evidence for acute intracranial abnormality. Mild atrophy. Electronically Signed   By: Luke Bun M.D.   On: 12/18/2023 18:08   CT Knee Left Wo  Contrast Result Date: 12/18/2023 CLINICAL DATA:  Fall.  Lateral tibial plateau fracture. EXAM: CT OF THE LEFT KNEE WITHOUT CONTRAST TECHNIQUE: Multidetector CT imaging of the left knee was performed according to the standard protocol. Multiplanar CT image reconstructions were also generated. RADIATION DOSE REDUCTION: This exam was performed according to the departmental dose-optimization program which includes automated exposure control, adjustment of the mA and/or kV according to patient size and/or use of iterative reconstruction technique. COMPARISON:  Earlier same day radiographs of the left tibia/fibula. FINDINGS: Bones/Joint/Cartilage Comminuted fracture of the lateral tibial plateau with minimal medial displacement anteriorly measuring less than 2 mm. There is mild articular surface depression measuring approximately 1-2 mm. Moderate volume lipohemarthrosis. No additional fracture identified. No dislocation. Mild medial femorotibial compartment joint space narrowing. Ligaments Ligaments are suboptimally evaluated by CT. Muscles and Tendons Muscles are within normal limits. The quadriceps and patellar tendons are intact. Soft tissue No fluid collection or hematoma. IMPRESSION: 1. Comminuted fracture of the lateral tibial plateau with mild articular surface depression measuring 1-2 mm (Schatzker type II). 2. Moderate volume lipohemarthrosis. Electronically Signed   By: Harrietta Sherry M.D.   On: 12/18/2023 16:28   CT CHEST ABDOMEN PELVIS WO CONTRAST Result Date: 12/18/2023 CLINICAL DATA:  Fall, pelvic fractures. Left shoulder pain and left hip pain. EXAM: CT CHEST, ABDOMEN AND PELVIS WITHOUT CONTRAST TECHNIQUE: Multidetector CT imaging of the chest, abdomen and pelvis was performed following the standard protocol without IV contrast. RADIATION DOSE REDUCTION: This exam was performed according to the departmental dose-optimization program which includes automated exposure control, adjustment of the mA  and/or kV according to patient size and/or use of iterative reconstruction technique. COMPARISON:  CT chest 07/23/2017.  CT abdomen pelvis 12/15/2015. FINDINGS: CT CHEST FINDINGS Cardiovascular: Atherosclerotic calcification of the aorta. Heart size normal. No pericardial effusion. No pericardial effusion. Mediastinum/Nodes: No pathologically enlarged mediastinal or axillary lymph nodes. Hilar regions are difficult to definitively evaluate without IV contrast. Surgical clips in the axillary regions. Esophagus is grossly unremarkable. Lungs/Pleura: Focal rounded mucoid impaction in the medial right lower lobe (3/120), unchanged. Bibasilar scarring and volume loss. Trace medial pleural air in the upper left hemithorax. No pleural fluid. Airway is unremarkable. Musculoskeletal: Nondisplaced or minimally displaced anterolateral left third, fourth, fifth, sixth and seventh rib fractures. Minimally displaced fracture of the lateral left clavicle. Osteopenia. T12 compression fracture, age indeterminate. CT ABDOMEN PELVIS FINDINGS Hepatobiliary: Patient's left hand and arm create streak artifact in the upper abdomen, degrading image quality. Liver and gallbladder are grossly unremarkable. No biliary ductal dilatation. Pancreas: Negative. Spleen: Negative. Adrenals/Urinary Tract: Adrenal glands and right kidney are unremarkable. Small left renal stone. Ureters are decompressed. Bladder is grossly unremarkable. Stomach/Bowel: Stomach, small bowel and colon are unremarkable. Appendix is not visualized. Vascular/Lymphatic: Atherosclerotic calcification of the aorta. No pathologically enlarged lymph nodes. Reproductive: Hysterectomy.  No adnexal mass. Other: Hemorrhagic stranding between the bladder and left acetabulum. Otherwise, no free fluid. No free air. Mesenteries and peritoneum are unremarkable. Musculoskeletal: Nondisplaced left superior and inferior pubic rami fractures with  extension to the anterior left acetabulum.  Probable nondisplaced left pubic bone fracture. Nondisplaced right inferior pubic ramus fracture. Osteopenia. Dextroconvex scoliosis of the lumbar spine. IMPRESSION: 1. Multiple nondisplaced or minimally displaced left anterolateral rib fractures with the tiniest medial pneumothorax, likely clinically insignificant. 2. Lateral left clavicle fracture. 3. Left superior and inferior pubic rami and left pubic bone fractures with associated hematoma in the medial soft tissues. Left superior pubic ramus fracture appears to involve the anterior left acetabulum. Probable nondisplaced right inferior pubic ramus fracture. 4. T12 compression fracture, age indeterminate. 5. Small left renal stone. 6.  Aortic atherosclerosis (ICD10-I70.0). Electronically Signed   By: Newell Eke M.D.   On: 12/18/2023 16:22   DG Hip Unilat With Pelvis 2-3 Views Left Result Date: 12/18/2023 EXAM: 2 or 3 VIEW(S) XRAY OF THE LEFT HIP 12/18/2023 02:43:00 PM COMPARISON: None available. CLINICAL HISTORY: fall FINDINGS: BONES AND JOINTS: Nondisplaced fracture is noted at the junction of the left acetabulum and left superior pubic ramus. CT scan is recommended for further evaluation. No malalignment. SOFT TISSUES: The soft tissues are unremarkable. IMPRESSION: 1. Nondisplaced fracture at the junction of the left acetabulum and left superior pubic ramus. Recommend CT scan. Electronically signed by: Lynwood Seip MD 12/18/2023 03:12 PM EST RP Workstation: [id number]   DG FEMUR 1V LEFT Result Date: 12/18/2023 EXAM: 1 VIEW(S) XRAY OF THE LEFT FEMUR 12/18/2023 02:43:00 PM COMPARISON: None available. CLINICAL HISTORY: fall FINDINGS: BONES AND JOINTS: No acute fracture. No malalignment. SOFT TISSUES: The soft tissues are unremarkable. IMPRESSION: 1. No acute fracture or dislocation. Electronically signed by: Lynwood Seip MD 12/18/2023 03:10 PM EST RP Workstation: [id number]   DG Tibia/Fibula Left Port Result Date: 12/18/2023 EXAM: VIEW(S) XRAY OF  THE LEFT TIBIA AND FIBULA 12/18/2023 02:43:00 PM COMPARISON: None available. CLINICAL HISTORY: [id number] Fall [id number] FINDINGS: BONES AND JOINTS: Nondisplaced lateral tibial plateau fracture is noted. The fibula is unremarkable. No malalignment. SOFT TISSUES: The soft tissues are unremarkable. IMPRESSION: 1. Nondisplaced lateral tibial plateau fracture. Electronically signed by: Lynwood Seip MD 12/18/2023 03:09 PM EST RP Workstation: [id number]   DG Shoulder Left Port Result Date: 12/18/2023 EXAM: 1 VIEW(S) XRAY OF THE LEFT SHOULDER 12/18/2023 02:43:00 PM COMPARISON: None available. CLINICAL HISTORY: [id number] Fall [id number] FINDINGS: BONES AND JOINTS: Glenohumeral joint is normally aligned. Minimally displaced distal left clavicular fracture is noted. No malalignment. The [facility] joint is unremarkable. SOFT TISSUES: No abnormal calcifications. Visualized lung is unremarkable. IMPRESSION: 1. Minimally displaced distal left clavicular fracture. Electronically signed by: Lynwood Seip MD 12/18/2023 03:07 PM EST RP Workstation: [id number]    Anti-infectives: Anti-infectives (From admission, onward)    None        Assessment/Plan Mechanical fall  Left 3-7 rib fxs with trace PTX - multimodal pain control, pulm toilet, IS, CXR this AM without PTX but maybe a little wet, continue to monitor Left clavicle fx - sling, await ortho recs Chronic T12 compression fx - pt reports this is from 3-4 years ago Left pubic rami fxs with extension into acetabulum Left pubic bone fx Right inferior pubic ramus fx - await recs from orthopedic surgery regarding pelvic fx management Left tibial plateau fx with moderate lipohemarthrosis - KI to L knee, await ortho recs Hx of breast CA s/p bilateral mastectomy and chemo Chemo related neuropathy  Osteoporosis  FEN: NPO, SLIV - may need some IV Fluids later today if OR planned VTE: LMWH ID: no current indication for abx  Dispo: Await ortho recs. Will need therapies once WB status  clear  LOS: 1 day   I reviewed ED provider notes, Consultant Ortho notes, last 24 h vitals and pain scores, last 48 h intake and output, last 24 h labs and trends, and last 24 h imaging results.  This care required moderate level of medical decision making.    Burnard JONELLE Louder, [facility] 12/19/2023, 8:29 AM Please see Amion for pager number during day hours 7:00am-4:30pm

## 2023-12-20 ENCOUNTER — Inpatient Hospital Stay (HOSPITAL_COMMUNITY)

## 2023-12-20 LAB — BASIC METABOLIC PANEL WITH GFR
Anion gap: 5 (ref 5–15)
BUN: 20 mg/dL (ref 8–23)
CO2: 27 mmol/L (ref 22–32)
Calcium: 8 mg/dL — ABNORMAL LOW (ref 8.9–10.3)
Chloride: 104 mmol/L (ref 98–111)
Creatinine, Ser: 0.75 mg/dL (ref 0.44–1.00)
GFR, Estimated: >60 mL/min (ref >60)
Glucose, Bld: 119 mg/dL — ABNORMAL HIGH (ref 70–99)
Potassium: 3.8 mmol/L (ref 3.5–5.1)
Sodium: 136 mmol/L (ref 135–145)

## 2023-12-20 LAB — URINALYSIS, MICROSCOPIC (REFLEX): Bacteria, UA: NONE SEEN

## 2023-12-20 LAB — URINALYSIS, ROUTINE W REFLEX MICROSCOPIC
Glucose, UA: NEGATIVE mg/dL
Ketones, ur: NEGATIVE mg/dL
Leukocytes,Ua: NEGATIVE
Nitrite: NEGATIVE
Protein, ur: NEGATIVE mg/dL
Specific Gravity, Urine: 1.025 (ref 1.005–1.030)
pH: 6 (ref 5.0–8.0)

## 2023-12-20 LAB — CBC
HCT: 36.4 % (ref 36.0–46.0)
Hemoglobin: 12 g/dL (ref 12.0–15.0)
MCH: 32.1 pg (ref 26.0–34.0)
MCHC: 33 g/dL (ref 30.0–36.0)
MCV: 97.3 fL (ref 80.0–100.0)
Platelets: 111 K/uL — ABNORMAL LOW (ref 150–400)
RBC: 3.74 MIL/uL — ABNORMAL LOW (ref 3.87–5.11)
RDW: 12.4 % (ref 11.5–15.5)
WBC: 10.2 K/uL (ref 4.0–10.5)
nRBC: 0 % (ref 0.0–0.2)

## 2023-12-20 MED ORDER — IBUPROFEN 200 MG PO TABS
600.0000 mg | ORAL_TABLET | Freq: Three times a day (TID) | ORAL | Status: DC
  Administered 2023-12-20 – 2024-01-03 (×42): 600 mg via ORAL
  Filled 2023-12-20 (×42): qty 3

## 2023-12-20 MED ORDER — PROMETHAZINE HCL 25 MG RE SUPP: 25.0000 mg | Freq: Four times a day (QID) | RECTAL | Status: DC | PRN

## 2023-12-20 MED ORDER — POLYETHYLENE GLYCOL 3350 17 G PO PACK
17.0000 g | PACK | Freq: Every day | ORAL | Status: DC
  Administered 2023-12-20 – 2023-12-25 (×5): 17 g via ORAL
  Filled 2023-12-20 (×5): qty 1

## 2023-12-20 MED ORDER — ONDANSETRON HCL 4 MG/2ML IJ SOLN: 4.0000 mg | Freq: Four times a day (QID) | INTRAMUSCULAR | Status: DC | PRN

## 2023-12-20 MED ORDER — HYDROMORPHONE HCL 1 MG/ML IJ SOLN
1.0000 mg | INTRAMUSCULAR | Status: DC | PRN
  Administered 2023-12-22 – 2023-12-23 (×2): 1 mg via INTRAVENOUS
  Filled 2023-12-20 (×2): qty 1

## 2023-12-20 MED ORDER — OXYCODONE HCL 5 MG PO TABS
5.0000 mg | ORAL_TABLET | ORAL | Status: DC | PRN
  Administered 2023-12-20 – 2023-12-26 (×8): 5 mg via ORAL
  Administered 2023-12-27 (×3): 7.5 mg via ORAL
  Administered 2023-12-28 – 2024-01-03 (×19): 5 mg via ORAL
  Filled 2023-12-20 (×6): qty 1
  Filled 2023-12-20: qty 2
  Filled 2023-12-20 (×7): qty 1
  Filled 2023-12-20: qty 2
  Filled 2023-12-20 (×9): qty 1
  Filled 2023-12-20: qty 2
  Filled 2023-12-20 (×7): qty 1

## 2023-12-20 MED ORDER — OXYCODONE HCL 5 MG PO TABS
5.0000 mg | ORAL_TABLET | Freq: Once | ORAL | Status: AC
  Administered 2023-12-20: 5 mg via ORAL
  Filled 2023-12-20: qty 1

## 2023-12-20 MED ORDER — METHOCARBAMOL 750 MG PO TABS
750.0000 mg | ORAL_TABLET | Freq: Four times a day (QID) | ORAL | Status: DC
  Administered 2023-12-20 – 2024-01-03 (×55): 750 mg via ORAL
  Filled 2023-12-20 (×55): qty 1

## 2023-12-20 MED ORDER — PROMETHAZINE HCL 25 MG PO TABS
25.0000 mg | ORAL_TABLET | Freq: Four times a day (QID) | ORAL | Status: DC | PRN
  Filled 2023-12-20: qty 1

## 2023-12-20 MED ORDER — SODIUM CHLORIDE 0.9 % IV SOLN
12.5000 mg | Freq: Four times a day (QID) | INTRAVENOUS | Status: DC | PRN
  Administered 2023-12-23: 06:00:00 12.5 mg via INTRAVENOUS
  Filled 2023-12-20: qty 12.5

## 2023-12-20 MED ORDER — ONDANSETRON 4 MG PO TBDP
4.0000 mg | ORAL_TABLET | Freq: Four times a day (QID) | ORAL | Status: DC | PRN
  Administered 2023-12-24: 08:00:00 4 mg via ORAL
  Filled 2023-12-20: qty 1

## 2023-12-20 NOTE — Progress Notes (Signed)
° °  Inpatient Rehab Admissions Coordinator :  Per therapy recommendations patient was screened for CIR candidacy by Heron Leavell RN MSN. Patient is not yet at a level to tolerate the intensity required to pursue a CIR admit. Total assist at bed level and unable to clear buttocks from bed.  The CIR admissions team will follow and monitor for progress and place a Rehab Consult order if felt to be appropriate. Please contact me with any questions.  Heron Leavell RN MSN Admissions Coordinator [phone]

## 2023-12-20 NOTE — Progress Notes (Signed)
 Progress Note     Subjective: Pt reports no pain at rest but pain throughout left side with trying to move, most severe over her ribs. Tolerating PO. +Flatus. No BM yet. No reported urinary sxs. Has not worked with therapies yet. Husband at bedside.  1,000 cc on IS  Objective: Vital signs in last 24 hours: Temp:  [98.3 F (36.8 C)-102.7 F (39.3 C)] 98.3 F (36.8 C) (12/12 0800) Pulse Rate:  [76-112] 93 (12/12 0800) Resp:  [14-21] 16 (12/12 0800) BP: (95-129)/(57-72) 104/61 (12/12 0800) SpO2:  [88 %-99 %] 93 % (12/12 0800) Last BM Date : 12/17/23  Intake/Output from previous day: No intake/output data recorded. Intake/Output this shift: No intake/output data recorded.  PE: General: pleasant, WD, WN female who is laying in bed in NAD HEENT: head is normocephalic, atraumatic.  Sclera are noninjected.  EOMI.  Ears and nose without any masses or lesions.  Mouth is pink and mois Heart: regular, rate, and rhythm.  Palpable radial and pedal pulses bilaterally Lungs: CTAB, no wheezes, rhonchi, or rales noted.  Respiratory effort nonlabored on 1-2L [place] Abd: soft, NT, ND, no masses, hernias, or organomegaly MS: LUE in sling, L hand WWP and NVI; L knee in small KI, L foot WWP; RLE WWP  Skin: warm and dry with no masses, lesions, or rashes Neuro: Cranial nerves 2-12 grossly intact, sensation is normal throughout Psych: A&Ox3 with an appropriate affect.    Lab Results:  Recent Labs    12/19/23 0410 12/20/23 0329  WBC 10.8* 10.2  HGB 12.4 12.0  HCT 37.9 36.4  PLT 149* 111*   BMET Recent Labs    12/19/23 0410 12/20/23 0329  NA 139 136  K 4.0 3.8  CL 109 104  CO2 25 27  GLUCOSE 135* 119*  BUN 16 20  CREATININE 0.65 0.75  CALCIUM  8.3* 8.0*   PT/INR No results for input(s): LABPROT, INR in the last 72 hours. CMP     Component Value Date/Time   NA 136 12/20/2023 0329   NA 147 (H) 12/18/2016 1125   NA 143 12/14/2015 0932   K 3.8 12/20/2023 0329   K 4.2  12/18/2016 1125   K 4.3 12/14/2015 0932   CL 104 12/20/2023 0329   CL 103 12/18/2016 1125   CO2 27 12/20/2023 0329   CO2 30 12/18/2016 1125   CO2 27 12/14/2015 0932   GLUCOSE 119 (H) 12/20/2023 0329   GLUCOSE 101 12/18/2016 1125   BUN 20 12/20/2023 0329   BUN 11 12/18/2016 1125   BUN 10.2 12/14/2015 0932   CREATININE 0.75 12/20/2023 0329   CREATININE 0.71 12/04/2023 1033   CREATININE 0.9 12/18/2016 1125   CREATININE 0.8 12/14/2015 0932   CALCIUM  8.0 (L) 12/20/2023 0329   CALCIUM  10.3 12/18/2016 1125   CALCIUM  10.1 12/14/2015 0932   PROT 6.8 12/04/2023 1033   PROT 6.9 12/18/2016 1125   PROT 6.8 12/14/2015 0932   ALBUMIN 4.3 12/04/2023 1033   ALBUMIN 3.8 12/18/2016 1125   ALBUMIN 3.7 12/14/2015 0932   AST 22 12/04/2023 1033   AST 32 12/14/2015 0932   ALT 16 12/04/2023 1033   ALT 32 12/18/2016 1125   ALT 35 12/14/2015 0932   ALKPHOS 93 12/04/2023 1033   ALKPHOS 73 12/18/2016 1125   ALKPHOS 75 12/14/2015 0932   BILITOT 0.7 12/04/2023 1033   BILITOT 0.82 12/14/2015 0932   GFRNONAA >60 12/20/2023 0329   GFRNONAA >60 12/04/2023 1033   GFRAA >60 07/09/2019 1144  Lipase  No results found for: LIPASE     Studies/Results: DG CHEST PORT 1 VIEW Result Date: 12/20/2023 EXAM: 1 VIEW(S) XRAY OF THE CHEST 12/20/2023 07:33:00 AM COMPARISON: Portable chest yesterday at 7:14 am. CLINICAL HISTORY: Pulmonary edema. FINDINGS: LINES, TUBES AND DEVICES: Overlying telemetry leads. LUNGS AND PLEURA: There are continued low lung volumes with basilar haziness, which could be atelectasis or infiltrates. No new lung opacity is seen, and the remaining lungs are clear. No pleural effusion. No pneumothorax. HEART AND MEDIASTINUM: No acute abnormality of the cardiac and mediastinal silhouettes. BONES AND SOFT TISSUES: There are left axillary surgical clips, overlying breast implants. Osteopenia. No acute osseous abnormality. IMPRESSION: 1. Continued low lung volumes with basilar haziness, which could  reflect atelectasis or infiltrates. 2. No new lung opacity. Electronically signed by: Francis Quam MD 12/20/2023 07:39 AM EST RP Workstation: [id number]   DG Clavicle Left Result Date: 12/19/2023 CLINICAL DATA:  Fall.  Left clavicle pain. EXAM: LEFT CLAVICLE - 2+ VIEWS COMPARISON:  Left shoulder radiograph dated 12/18/2023. FINDINGS: Redemonstrated minimally displaced fracture of the mid to distal left clavicle. The acromioclavicular joint is anatomically aligned. The glenohumeral joint is anatomically aligned. IMPRESSION: 1. Redemonstrated minimally displaced fracture of the mid to distal left clavicle. Electronically Signed   By: Harrietta Sherry M.D.   On: 12/19/2023 12:25   DG CHEST PORT 1 VIEW Result Date: 12/19/2023 EXAM: XR Chest, 1 View. CLINICAL HISTORY: 78 year old female with rib fractures, pneumothorax. COMPARISON: 12/18/2023. Colon CT chest, abdomen, and pelvis 12/18/2023. FINDINGS: LUNGS: Bibasilar curvilinear opacities. Ongoing low lung volumes. Streaky lower lung peribronchial opacity bilaterally most resembles atelectasis. No pulmonary edema. PLEURAL SPACES: Pneumothorax identified.  Small pleural effusions difficult to exclude. HEART: The heart size is normal. SOFT TISSUES: Surgical clips in left axilla and left breast. Breast implants are less apparent on these images. BONES: More kyphotic positioning. Left rib fractures better demonstrated by CT. IMPRESSION: 1. No Pneumothorax identified. 2. Low lung volumes. Bibasilar opacities, favor atelectasis. Small pleural effusions difficult to exclude. Electronically signed by: Helayne Hurst MD 12/19/2023 07:34 AM EST RP Workstation: [id number]   DG Chest Port 1 View Result Date: 12/18/2023 EXAM: 1 VIEW(S) XRAY OF THE CHEST 12/18/2023 10:29:36 PM COMPARISON: None available. CLINICAL HISTORY: Pneumothorax FINDINGS: LUNGS AND PLEURA: Low lung volumes. No radiographic evidence of significant pneumothorax. Limited evaluation of the lung apices due  to patient positioning. Interval development of coarsened interstitial markings with question of developing pulmonary edema. No pleural effusion. HEART AND MEDIASTINUM: Atherosclerotic plaque. No acute abnormality of the cardiac and mediastinal silhouettes. BONES AND SOFT TISSUES: Surgical clips in left axilla and left breast. No acute osseous abnormality. IMPRESSION: 1. No radiographic evidence of significant pneumothorax. 2. Interval development of coarsened interstitial markings with question of developing pulmonary edema. 3. Recommend repeat PA and lateral view of the chest with improved patient positioning for further evaluation. Electronically signed by: Morgane Naveau MD 12/18/2023 10:45 PM EST RP Workstation: [id number]   CT Cervical Spine Wo Contrast Result Date: 12/18/2023 CLINICAL DATA:  Neck trauma mechanical fall EXAM: CT CERVICAL SPINE WITHOUT CONTRAST TECHNIQUE: Multidetector CT imaging of the cervical spine was performed without intravenous contrast. Multiplanar CT image reconstructions were also generated. RADIATION DOSE REDUCTION: This exam was performed according to the departmental dose-optimization program which includes automated exposure control, adjustment of the mA and/or kV according to patient size and/or use of iterative reconstruction technique. COMPARISON:  None Available. FINDINGS: Alignment: Mild reversal of cervical lordosis. Trace anterolisthesis C3 on C4, C4 on  C5 and C7 on T1. Facet alignment is normal Skull base and vertebrae: No acute fracture. No primary bone lesion or focal pathologic process. Soft tissues and spinal canal: No prevertebral fluid or swelling. No visible canal hematoma. Disc levels: None multilevel degenerative changes. Moderate disc space narrowing C2-C3, C4-C5, C5-C6 and C6-C7. Multilevel facet degenerative changes. Left greater than right multilevel foraminal narrowing Upper chest: Lung apices are clear. 1.7 cm hypodense right thyroid nodule increased  compared to prior. Other: None IMPRESSION: 1. Mild reversal of cervical lordosis with multilevel degenerative changes. No acute osseous abnormality. 2. 1.7 cm hypodense right thyroid nodule. Recommend non emergent thyroid ultrasound evaluation Electronically Signed   By: Luke Bun M.D.   On: 12/18/2023 18:13   CT Head Wo Contrast Result Date: 12/18/2023 CLINICAL DATA:  Head trauma mechanical fall EXAM: CT HEAD WITHOUT CONTRAST TECHNIQUE: Contiguous axial images were obtained from the base of the skull through the vertex without intravenous contrast. RADIATION DOSE REDUCTION: This exam was performed according to the departmental dose-optimization program which includes automated exposure control, adjustment of the mA and/or kV according to patient size and/or use of iterative reconstruction technique. COMPARISON:  CT brain report 06/24/2007 FINDINGS: Brain: No acute territorial infarction, hemorrhage or intracranial mass. Mild atrophy. No ventricular enlargement. Vascular: No hyperdense vessels.  Carotid vascular calcification Skull: Normal. Negative for fracture or focal lesion. Sinuses/Orbits: Mild mucosal thickening in the sinuses Other: None IMPRESSION: No CT evidence for acute intracranial abnormality. Mild atrophy. Electronically Signed   By: Luke Bun M.D.   On: 12/18/2023 18:08   CT Knee Left Wo Contrast Result Date: 12/18/2023 CLINICAL DATA:  Fall.  Lateral tibial plateau fracture. EXAM: CT OF THE LEFT KNEE WITHOUT CONTRAST TECHNIQUE: Multidetector CT imaging of the left knee was performed according to the standard protocol. Multiplanar CT image reconstructions were also generated. RADIATION DOSE REDUCTION: This exam was performed according to the departmental dose-optimization program which includes automated exposure control, adjustment of the mA and/or kV according to patient size and/or use of iterative reconstruction technique. COMPARISON:  Earlier same day radiographs of the left  tibia/fibula. FINDINGS: Bones/Joint/Cartilage Comminuted fracture of the lateral tibial plateau with minimal medial displacement anteriorly measuring less than 2 mm. There is mild articular surface depression measuring approximately 1-2 mm. Moderate volume lipohemarthrosis. No additional fracture identified. No dislocation. Mild medial femorotibial compartment joint space narrowing. Ligaments Ligaments are suboptimally evaluated by CT. Muscles and Tendons Muscles are within normal limits. The quadriceps and patellar tendons are intact. Soft tissue No fluid collection or hematoma. IMPRESSION: 1. Comminuted fracture of the lateral tibial plateau with mild articular surface depression measuring 1-2 mm (Schatzker type II). 2. Moderate volume lipohemarthrosis. Electronically Signed   By: Harrietta Sherry M.D.   On: 12/18/2023 16:28   CT CHEST ABDOMEN PELVIS WO CONTRAST Result Date: 12/18/2023 CLINICAL DATA:  Fall, pelvic fractures. Left shoulder pain and left hip pain. EXAM: CT CHEST, ABDOMEN AND PELVIS WITHOUT CONTRAST TECHNIQUE: Multidetector CT imaging of the chest, abdomen and pelvis was performed following the standard protocol without IV contrast. RADIATION DOSE REDUCTION: This exam was performed according to the departmental dose-optimization program which includes automated exposure control, adjustment of the mA and/or kV according to patient size and/or use of iterative reconstruction technique. COMPARISON:  CT chest 07/23/2017.  CT abdomen pelvis 12/15/2015. FINDINGS: CT CHEST FINDINGS Cardiovascular: Atherosclerotic calcification of the aorta. Heart size normal. No pericardial effusion. No pericardial effusion. Mediastinum/Nodes: No pathologically enlarged mediastinal or axillary lymph nodes. Hilar regions are  difficult to definitively evaluate without IV contrast. Surgical clips in the axillary regions. Esophagus is grossly unremarkable. Lungs/Pleura: Focal rounded mucoid impaction in the medial right  lower lobe (3/120), unchanged. Bibasilar scarring and volume loss. Trace medial pleural air in the upper left hemithorax. No pleural fluid. Airway is unremarkable. Musculoskeletal: Nondisplaced or minimally displaced anterolateral left third, fourth, fifth, sixth and seventh rib fractures. Minimally displaced fracture of the lateral left clavicle. Osteopenia. T12 compression fracture, age indeterminate. CT ABDOMEN PELVIS FINDINGS Hepatobiliary: Patient's left hand and arm create streak artifact in the upper abdomen, degrading image quality. Liver and gallbladder are grossly unremarkable. No biliary ductal dilatation. Pancreas: Negative. Spleen: Negative. Adrenals/Urinary Tract: Adrenal glands and right kidney are unremarkable. Small left renal stone. Ureters are decompressed. Bladder is grossly unremarkable. Stomach/Bowel: Stomach, small bowel and colon are unremarkable. Appendix is not visualized. Vascular/Lymphatic: Atherosclerotic calcification of the aorta. No pathologically enlarged lymph nodes. Reproductive: Hysterectomy.  No adnexal mass. Other: Hemorrhagic stranding between the bladder and left acetabulum. Otherwise, no free fluid. No free air. Mesenteries and peritoneum are unremarkable. Musculoskeletal: Nondisplaced left superior and inferior pubic rami fractures with extension to the anterior left acetabulum. Probable nondisplaced left pubic bone fracture. Nondisplaced right inferior pubic ramus fracture. Osteopenia. Dextroconvex scoliosis of the lumbar spine. IMPRESSION: 1. Multiple nondisplaced or minimally displaced left anterolateral rib fractures with the tiniest medial pneumothorax, likely clinically insignificant. 2. Lateral left clavicle fracture. 3. Left superior and inferior pubic rami and left pubic bone fractures with associated hematoma in the medial soft tissues. Left superior pubic ramus fracture appears to involve the anterior left acetabulum. Probable nondisplaced right inferior pubic  ramus fracture. 4. T12 compression fracture, age indeterminate. 5. Small left renal stone. 6.  Aortic atherosclerosis (ICD10-I70.0). Electronically Signed   By: Newell Eke M.D.   On: 12/18/2023 16:22   DG Hip Unilat With Pelvis 2-3 Views Left Result Date: 12/18/2023 EXAM: 2 or 3 VIEW(S) XRAY OF THE LEFT HIP 12/18/2023 02:43:00 PM COMPARISON: None available. CLINICAL HISTORY: fall FINDINGS: BONES AND JOINTS: Nondisplaced fracture is noted at the junction of the left acetabulum and left superior pubic ramus. CT scan is recommended for further evaluation. No malalignment. SOFT TISSUES: The soft tissues are unremarkable. IMPRESSION: 1. Nondisplaced fracture at the junction of the left acetabulum and left superior pubic ramus. Recommend CT scan. Electronically signed by: Lynwood Seip MD 12/18/2023 03:12 PM EST RP Workstation: [id number]   DG FEMUR 1V LEFT Result Date: 12/18/2023 EXAM: 1 VIEW(S) XRAY OF THE LEFT FEMUR 12/18/2023 02:43:00 PM COMPARISON: None available. CLINICAL HISTORY: fall FINDINGS: BONES AND JOINTS: No acute fracture. No malalignment. SOFT TISSUES: The soft tissues are unremarkable. IMPRESSION: 1. No acute fracture or dislocation. Electronically signed by: Lynwood Seip MD 12/18/2023 03:10 PM EST RP Workstation: [id number]   DG Tibia/Fibula Left Port Result Date: 12/18/2023 EXAM: VIEW(S) XRAY OF THE LEFT TIBIA AND FIBULA 12/18/2023 02:43:00 PM COMPARISON: None available. CLINICAL HISTORY: [id number] Fall [id number] FINDINGS: BONES AND JOINTS: Nondisplaced lateral tibial plateau fracture is noted. The fibula is unremarkable. No malalignment. SOFT TISSUES: The soft tissues are unremarkable. IMPRESSION: 1. Nondisplaced lateral tibial plateau fracture. Electronically signed by: Lynwood Seip MD 12/18/2023 03:09 PM EST RP Workstation: [id number]   DG Shoulder Left Port Result Date: 12/18/2023 EXAM: 1 VIEW(S) XRAY OF THE LEFT SHOULDER 12/18/2023 02:43:00 PM COMPARISON: None available. CLINICAL  HISTORY: 809823 Fall [id number] FINDINGS: BONES AND JOINTS: Glenohumeral joint is normally aligned. Minimally displaced distal left clavicular fracture is noted. No malalignment. The [facility] joint is  unremarkable. SOFT TISSUES: No abnormal calcifications. Visualized lung is unremarkable. IMPRESSION: 1. Minimally displaced distal left clavicular fracture. Electronically signed by: Lynwood Seip MD 12/18/2023 03:07 PM EST RP Workstation: [id number]    Anti-infectives: Anti-infectives (From admission, onward)    None        Assessment/Plan Mechanical fall  Left 3-7 rib fxs with trace PTX - multimodal pain control, pulm toilet, IS, CXR 12/11 without PTX but maybe a little wet, continue to monitor Left clavicle fx - sling, non-op mgmt per Dr. Kendal, NWB Chronic T12 compression fx - pt reports this is from 3-4 years ago Left pubic rami fxs with extension into acetabulum Left pubic bone fx Right inferior pubic ramus fx - per ortho Dr. Kendal, attempt Non-op mgmt with TDWB LLE Left tibial plateau fx with moderate lipohemarthrosis - KI to L knee, TDWB Hx of breast CA s/p bilateral mastectomy and chemo  Chemo related neuropathy  Osteoporosis  FEN: reg diet VTE: LMWH ID: no current indication for abx  Dispo: PT/OT, add advil for pain control    LOS: 2 days   I reviewed ED provider notes, Consultant Ortho notes, last 24 h vitals and pain scores, last 48 h intake and output, last 24 h labs and trends, and last 24 h imaging results.  This care required moderate level of medical decision making.    Almarie GORMAN Pringle, [facility] 12/20/2023, 12:35 PM Please see Amion for pager number during day hours 7:00am-4:30pm

## 2023-12-20 NOTE — Progress Notes (Signed)
 Orthopaedic Trauma Progress Note  SUBJECTIVE: Patient doing okay this morning.  No significant pain at rest but does note notable pain throughout the left side when trying to move.  No lateral rib pain particularly when coughing.  Denies any numbness or tingling throughout left upper or lower extremity.  Has felt some clicking over the clavicle with movement of the shoulder.  Has been tolerating her sling fairly well.  No other specific complaints this morning.  No BM since admission.  +flatus. No family at bedside currently  OBJECTIVE:  Vitals:   12/20/23 0800 12/20/23 1100  BP: 104/61 112/66  Pulse: 93 98  Resp: 16 20  Temp: 98.3 F (36.8 C) 98.3 F (36.8 C)  SpO2: 93% 93%    Opiates Today (MME): Today's  total administered Morphine  Milligram Equivalents: 22.5 Opiates Yesterday (MME): Yesterday's total administered Morphine  Milligram Equivalents: 27.5  General: Sitting up in bed, no acute distress.  Pleasant and cooperative Respiratory: No increased work of breathing.  LUE: Sling in place.  Tenderness with palpation over the clavicle.  Notable crepitus.  No significant tenderness about the shoulder.  Did not attempt any significant shoulder motion.  Patient tolerates gentle elbow flexion and extension.  Wrist flexion/extension intact.  Endorses sensation to the median, ulnar, radial nerve distributions.  Fingers warm and well-perfused. + Radial pulse  Left lower extremity: Moderate knee effusion.  No significant valgus alignment in the knee.  Able to get full knee extension.  Did not attempt any significant knee flexion.  Compartments are soft compressible.  Motor and sensory function intact distally.  Toes warm and well-perfused.  + DP pulse    IMAGING:  X-rays and CT scan of the left knee show a posterior lateral tibial plateau fracture.  There is some mild impaction but anterior joint looks pretty good.  No significant medial joint involvement.   2 views of the left shoulder show a  minimally displaced clavicle fracture.  Hard to visualize fully on the CT scan as well.  2 views of the the clavicle again shows minimally displaced mid to distal clavicle fracture.  No displacement noted on these upright films compared to previous shoulder x-ray   CT scan of the pelvis shows zone 1 sacral fracture consistent with a LC pelvic ring injury.  There is a high superior pubic ramus fracture that is nondisplaced.    LABS:  Results for orders placed or performed during the hospital encounter of 12/18/23 (from the past 24 hours)  CBC     Status: Abnormal   Collection Time: 12/20/23  3:29 AM  Result Value Ref Range   WBC 10.2 4.0 - 10.5 K/uL   RBC 3.74 (L) 3.87 - 5.11 MIL/uL   Hemoglobin 12.0 12.0 - 15.0 g/dL   HCT 63.5 63.9 - 53.9 %   MCV 97.3 80.0 - 100.0 fL   MCH 32.1 26.0 - 34.0 pg   MCHC 33.0 30.0 - 36.0 g/dL   RDW 87.5 88.4 - 84.4 %   Platelets 111 (L) 150 - 400 K/uL   nRBC 0.0 0.0 - 0.2 %  Basic metabolic panel     Status: Abnormal   Collection Time: 12/20/23  3:29 AM  Result Value Ref Range   Sodium 136 135 - 145 mmol/L   Potassium 3.8 3.5 - 5.1 mmol/L   Chloride 104 98 - 111 mmol/L   CO2 27 22 - 32 mmol/L   Glucose, Bld 119 (H) 70 - 99 mg/dL   BUN 20 8 -  23 mg/dL   Creatinine, Ser 9.24 0.44 - 1.00 mg/dL   Calcium  8.0 (L) 8.9 - 10.3 mg/dL   GFR, Estimated >39 >39 mL/min   Anion gap 5 5 - 15    ASSESSMENT: Margaret English is a 78 y.o. female s/p fall at home  Injuries: Left clavicle fracture, left lateral compression pelvic ring injury, left lateral tibial plateau fracture  CV/Blood loss: Hemoglobin 12.0 this morning hemodynamically stable  PLAN: Weightbearing:   LLE -TDWB LUE - NWB ROM:  LLE - Maintain knee immobilizer.  Okay for hip and ankle ROM as tolerated LUE - Maintain sling.  Okay for elbow and wrist motion as tolerated  Showering: Okay to shower.  Knee immobilizer and sling may be removed for showering Orthopedic device(s):  LLE - Knee  immobilizer to be worn at all times except can open when seated/laying for ice  LUE - Use sling as needed for comfort Pain management:  1. Tylenol  1000 mg q 6 hours scheduled 2. Robaxin  750 mg 4 times daily  3. Oxycodone 5-7.5 mg q 4 hours PRN 4. Dilaudid 1 mg q 4 hours PRN 5.  Neurontin  300 mg 3 times daily 6.  Ibuprofen 600 mg 3 times daily 7. Lidocaine patch 5% VTE prophylaxis: Lovenox, SCDs Foley/Lines:  No foley, KVO IVFs Impediments to Fracture Healing: Vitamin D  level 62, continue home dose supplementation Dispo: PT/OT evaluation ongoing.  Will plan to treat all fractures nonoperatively.  Okay for discharge from ortho standpoint once cleared by trauma team and therapies  D/C recommendations: - Oxycodone, Robaxin , Tylenol , lidocaine patches 5% for pain control - Aspirin 325 mg daily x 30 days for DVT prophylaxis - Continue home dose Vit D supplementation  Follow - up plan: 2 weeks after d/c for repeat x-rays left clavicle, left knee, pelvis  Contact information:  Franky Light MD, Margaret Moores PA-C. After hours and holidays please check [email or web address] for group call information for [facility]   Margaret PATRIC Moores, PA-C [phone] (office) [email or web address]

## 2023-12-20 NOTE — Evaluation (Signed)
 Physical Therapy Evaluation Patient Details Name: Margaret English MRN: [id number] DOB: 1945/01/10 Today's Date: 12/20/2023  History of Present Illness  78 y.o. female presents to [facility] hospital on 12/18/2023 after a fall. Imaging notable for L PTX, L rib 3-7 fx, L clavicle fx, T12 compression fx (chronic), multiple L pelvic fxs and R inferior pubic rami fxs, L tibial plateau fx. PMH includes breast CA s/p mastectomy, cataract, CKD, osteoporosis.  Clinical Impression  Pt presents to PT with deficits in functional mobility, balance, strength, power, endurance, and with pain. Pt currently requires significant physical assistance to perform bed mobility and initiates attempts at laterally scooting at bedside with PT assist. Pt was independent and working prior to this admission. Pt appears highly motivated and demonstrates the potential to make significant functional gains with high intensity inpatient PT services. Patient will benefit from intensive inpatient follow-up therapy, >3 hours/day.        If plan is discharge home, recommend the following: Two people to help with walking and/or transfers;Two people to help with bathing/dressing/bathroom;Assistance with cooking/housework;Assist for transportation;Help with stairs or ramp for entrance   Can travel by private vehicle        Equipment Recommendations Wheelchair (measurements PT);Wheelchair cushion (measurements PT);Hospital bed;Hoyer lift (may progress to sliding board rather than hoyer lift)  Recommendations for Other Services  Rehab consult    Functional Status Assessment Patient has had a recent decline in their functional status and demonstrates the ability to make significant improvements in function in a reasonable and predictable amount of time.     Precautions / Restrictions Precautions Precautions: Fall Recall of Precautions/Restrictions: Intact Required Braces or Orthoses: Sling;Knee Immobilizer - Left Knee Immobilizer - Left: On  at all times Restrictions Weight Bearing Restrictions Per Provider Order: Yes LUE Weight Bearing Per Provider Order: Non weight bearing LLE Weight Bearing Per Provider Order: Touchdown weight bearing      Mobility  Bed Mobility Overal bed mobility: Needs Assistance Bed Mobility: Supine to Sit, Sit to Supine     Supine to sit: HOB elevated, Used rails, Max assist Sit to supine: Total assist   General bed mobility comments: assist for LLE initially, then with bed pad to pivot hips along with support behind R shoulder to elevate trunk. Pt utilizing hand hold of spouse to pull into sitting as able    Transfers Overall transfer level: Needs assistance Equipment used: 1 person hand held assist Transfers: Bed to chair/wheelchair/BSC, Sit to/from Stand Sit to Stand:  (attempted to initiate sit to stand with hemiwalker but pt was unable to clear buttocks from bed)          Lateral/Scoot Transfers:  (initiated lateral scoots, 3 attempts with lateral movement of ~3 inches total to R side.)      Ambulation/Gait                  Stairs            Wheelchair Mobility     Tilt Bed    Modified Rankin (Stroke Patients Only)       Balance Overall balance assessment: Needs assistance Sitting-balance support: Single extremity supported, Feet supported Sitting balance-Leahy Scale: Poor                                       Pertinent Vitals/Pain Pain Assessment Pain Assessment: Faces Faces Pain Scale: Hurts whole lot Pain Location: L shoulder,  L ribs, L hip, R groin Pain Descriptors / Indicators: Aching Pain Intervention(s): Monitored during session    Home Living Family/patient expects to be discharged to:: Private residence Living Arrangements: Spouse/significant other;Other relatives (granddaughter) Available Help at Discharge: Family;Available 24 hours/day Type of Home: House Home Access: Stairs to enter Entrance Stairs-Rails:  None Entrance Stairs-Number of Steps: 1   Home Layout: One level Home Equipment: Shower seat - built in      Prior Function Prior Level of Function : Independent/Modified Independent;Working/employed;Driving             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 Extremity Assessment: LUE deficits/detail LUE Deficits / Details: LUE in sling, elbow ROM appears functional with attempts to reposition sling, strength assessment deferred    Lower Extremity Assessment Lower Extremity Assessment: LLE deficits/detail;RLE deficits/detail RLE Deficits / Details: ROM WFL, grossly 4/5 LLE Deficits / Details: LLE in knee immobilizer, ankle ROM WFL, hip strength not formally assessed but limited to <3/5 currently with pain seeming to limit    Cervical / Trunk Assessment Cervical / Trunk Assessment: Kyphotic  Communication   Communication Communication: No apparent difficulties    Cognition Arousal: Alert Behavior During Therapy: WFL for tasks assessed/performed   PT - Cognitive impairments: No apparent impairments                         Following commands: Intact       Cueing Cueing Techniques: Verbal cues     General Comments General comments (skin integrity, edema, etc.): pt on 2L [place], SpO2 in low 90s at rest, does drop to 86% in supine near end of session but recovers when [facility] is elevated    Exercises     Assessment/Plan    PT Assessment Patient needs continued PT services  PT Problem List Decreased strength;Decreased activity tolerance;Decreased balance;Decreased mobility;Decreased knowledge of use of DME;Pain       PT Treatment Interventions DME instruction;Functional mobility training;Therapeutic activities;Therapeutic exercise;Balance training;Neuromuscular re-education;Cognitive remediation;Patient/family education;Wheelchair mobility training    PT Goals (Current goals can be  found in the Care Plan section)  Acute Rehab PT Goals Patient Stated Goal: to return to prior level of function, improve transfer quality PT Goal Formulation: With patient/family Time For Goal Achievement: 01/03/24 Potential to Achieve Goals: Fair Additional Goals Additional Goal #1: Pt will mobilize in a manual wheelchair for >100' with minA, utilizing RUE and RLE to propel due to WB restrictions on left side    Frequency Min 3X/week     Co-evaluation               AM-PAC PT 6 Clicks Mobility  Outcome Measure Help needed turning from your back to your side while in a flat bed without using bedrails?: A Lot Help needed moving from lying on your back to sitting on the side of a flat bed without using bedrails?: A Lot Help needed moving to and from a bed to a chair (including a wheelchair)?: Total Help needed standing up from a chair using your arms (e.g., wheelchair or bedside chair)?: Total Help needed to walk in hospital room?: Total Help needed climbing 3-5 steps with a railing? : Total 6 Click Score: 8    End of Session Equipment Utilized During Treatment: Left knee immobilizer Activity Tolerance: Patient tolerated treatment well Patient left: in bed;with call bell/phone within reach;with bed alarm set;with family/visitor present  Nurse Communication: Mobility status;Need for lift equipment PT Visit Diagnosis: Other abnormalities of gait and mobility (R26.89);Muscle weakness (generalized) (M62.81);Pain Pain - Right/Left: Left Pain - part of body: Shoulder;Hip;Knee    Time: 8590-8540 PT Time Calculation (min) (ACUTE ONLY): 50 min   Charges:   PT Evaluation $PT Eval Moderate Complexity: 1 Mod   PT General Charges $$ ACUTE PT VISIT: 1 Visit         Bernardino JINNY Ruth, PT, DPT Acute Rehabilitation Office [phone]   Bernardino JINNY Ruth 12/20/2023, 4:13 PM

## 2023-12-20 NOTE — TOC Initial Note (Signed)
 Transition of Care [facility]) - Initial/Assessment Note    Patient Details  Name: Margaret English MRN: [id number] Date of Birth: 16-Sep-1945  Transition of Care [facility]) CM/SW Contact:    Elijah Michaelis M, RN Phone Number: 12/20/2023, 4:22 PM  Clinical Narrative:                 78 y.o. female presents to [facility] hospital on 12/18/2023 after a fall. Imaging notable for L PTX, L rib 3-7 fx, L clavicle fx, T12 compression fx (chronic), multiple L pelvic fxs and R inferior pubic rami fxs, L tibial plateau fx. PMH includes breast CA s/p mastectomy, cataract, CKD, osteoporosis.  PTA, pt independent and working; she lives with spouse and has 24h family assistance.  PT recommending CIR; await OT consult.   Will follow as patient progresses.   Expected Discharge Plan: IP Rehab Facility Barriers to Discharge: Continued Medical Work up            Expected Discharge Plan and Services   Discharge Planning Services: CM Consult                                          Prior Living Arrangements/Services   Lives with:: Spouse Patient language and need for interpreter reviewed:: Yes Do you feel safe going back to the place where you live?: Yes      Need for Family Participation in Patient Care: Yes (Comment) Care giver support system in place?: Yes (comment)   Criminal Activity/Legal Involvement Pertinent to Current Situation/Hospitalization: No - Comment as needed            Emotional Assessment   Attitude/Demeanor/Rapport: Engaged Affect (typically observed): Accepting Orientation: : Oriented to Self, Oriented to Place, Oriented to  Time, Oriented to Situation      Admission diagnosis:  Trauma [T14.90XA] Fall, initial encounter [W19.XXXA] Closed displaced fracture of left clavicle, unspecified part of clavicle, initial encounter [S42.002A] Closed fracture of proximal end of left tibia, unspecified fracture morphology, initial encounter [S82.102A] Patient Active Problem List    Diagnosis Date Noted   Trauma 12/18/2023   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   Genital herpes simplex 06/18/2017   Malignant neoplasm of female breast (HCC) 06/18/2017   Mixed hyperlipidemia 06/18/2017   Senile osteoporosis 06/18/2017   Vitamin D  deficiency 06/18/2017   PCP:  Aisha Harvey, MD Pharmacy:   [facility] [phone] - [place], [place] - [address] [place] [address] Phone: [phone] Fax: [phone]     Social Drivers of Health (SDOH) Social History: SDOH Screenings   Food Insecurity: No Food Insecurity (12/19/2023)  Housing: Unknown (12/19/2023)  Transportation Needs: No Transportation Needs (12/19/2023)  Utilities: Not At Risk (12/19/2023)  Depression (PHQ2-9): Low Risk (12/04/2023)  Social Connections: Moderately Integrated (12/19/2023)  Tobacco Use: Low Risk (12/18/2023)   SDOH Interventions:     Readmission Risk Interventions     No data to display         Mliss MICAEL Fass, RN, BSN  Trauma/Neuro ICU Case Manager [phone]

## 2023-12-20 NOTE — Discharge Instructions (Addendum)
 Orthopaedic Trauma Service Discharge Instructions   General Discharge Instructions  WEIGHT BEARING STATUS:Touchdown weightbearing left leg. Non-weightbearing left arm  RANGE OF MOTION/ACTIVITY:  Left leg- ok for hip and ankle motion as tolerated. Keep knee immobilizer in place Left arm - Use sling as needed for comfort. Ok for elbow and wrist motion. Avoid any aggressive shoulder motion  DVT/PE prophylaxis: Aspirin x 30 days  Diet: as you were eating previously.  Can use over the counter stool softeners and bowel preparations, such as Miralax, to help with bowel movements.  Narcotics can be constipating.  Be sure to drink plenty of fluids  PAIN MEDICATION USE AND EXPECTATIONS  You have likely been given narcotic medications to help control your pain.  After a traumatic event that results in an fracture (broken bone) with or without surgery, it is ok to use narcotic pain medications to help control one's pain.  We understand that everyone responds to pain differently and each individual patient will be evaluated on a regular basis for the continued need for narcotic medications. Ideally, narcotic medication use should last no more than 6-8 weeks (coinciding with fracture healing).   As a patient it is your responsibility as well to monitor narcotic medication use and report the amount and frequency you use these medications when you come to your office visit.   We would also advise that if you are using narcotic medications, you should take a dose prior to therapy to maximize you participation.  IF YOU ARE ON NARCOTIC MEDICATIONS IT IS NOT PERMISSIBLE TO OPERATE A MOTOR VEHICLE (MOTORCYCLE/CAR/TRUCK/MOPED) OR HEAVY MACHINERY DO NOT MIX NARCOTICS WITH OTHER CNS (CENTRAL NERVOUS SYSTEM) DEPRESSANTS SUCH AS ALCOHOL  POST-OPERATIVE OPIOID TAPER INSTRUCTIONS: It is important to wean off of your opioid medication as soon as possible. If you do not need pain medication after your surgery it is ok  to stop day one. Opioids include: Codeine, Hydrocodone(Norco, Vicodin), Oxycodone(Percocet, oxycontin) and hydromorphone amongst others.  Long term and even short term use of opiods can cause: Increased pain response Dependence Constipation Depression Respiratory depression And more.  Withdrawal symptoms can include Flu like symptoms Nausea, vomiting And more Techniques to manage these symptoms Hydrate well Eat regular healthy meals Stay active Use relaxation techniques(deep breathing, meditating, yoga) Do Not substitute Alcohol to help with tapering If you have been on opioids for less than two weeks and do not have pain than it is ok to stop all together.  Plan to wean off of opioids This plan should start within one week post op of your fracture surgery  Maintain the same interval or time between taking each dose and first decrease the dose.  Cut the total daily intake of opioids by one tablet each day Next start to increase the time between doses. The last dose that should be eliminated is the evening dose.    STOP SMOKING OR USING NICOTINE PRODUCTS!!!!  As discussed nicotine severely impairs your body's ability to heal surgical and traumatic wounds but also impairs bone healing.  Wounds and bone heal by forming microscopic blood vessels (angiogenesis) and nicotine is a vasoconstrictor (essentially, shrinks blood vessels).  Therefore, if vasoconstriction occurs to these microscopic blood vessels they essentially disappear and are unable to deliver necessary nutrients to the healing tissue.  This is one modifiable factor that you can do to dramatically increase your chances of healing your injury.  (This means no smoking, no nicotine gum, patches, etc)  DO NOT USE NONSTEROIDAL ANTI-INFLAMMATORY DRUGS (NSAID'S)  Using products such as Advil (ibuprofen), Aleve (naproxen), Motrin (ibuprofen) for additional pain control during fracture healing can delay and/or prevent the healing  response.  If you would like to take over the counter (OTC) medication, Tylenol  (acetaminophen ) is ok.  However, some narcotic medications that are given for pain control contain acetaminophen  as well. Therefore, you should not exceed more than 4000 mg of tylenol  in a day if you do not have liver disease.  Also note that there are may OTC medicines, such as cold medicines and allergy medicines that my contain tylenol  as well.  If you have any questions about medications and/or interactions please ask your doctor/PA or your pharmacist.      ICE AND ELEVATE INJURED/OPERATIVE EXTREMITY  Using ice and elevating the injured extremity above your heart can help with swelling and pain control.  Icing in a pulsatile fashion, such as 20 minutes on and 20 minutes off, can be followed.    Do not place ice directly on skin. Make sure there is a barrier between to skin and the ice pack.    Using frozen items such as frozen peas works well as the conform nicely to the are that needs to be iced.  USE AN ACE WRAP OR TED HOSE FOR SWELLING CONTROL  In addition to icing and elevation, Ace wraps or TED hose are used to help limit and resolve swelling.  It is recommended to use Ace wraps or TED hose until you are informed to stop.    When using Ace Wraps start the wrapping distally (farthest away from the body) and wrap proximally (closer to the body)   Example: If you had surgery on your leg or thing and you do not have a splint on, start the ace wrap at the toes and work your way up to the thigh        If you had surgery on your upper extremity and do not have a splint on, start the ace wrap at your fingers and work your way up to the upper arm  CALL THE OFFICE FOR MEDICATION REFILLS OR WITH ANY QUESTIONS/CONCERNS: [phone]   VISIT OUR WEBSITE FOR ADDITIONAL INFORMATION: [email or web address]   Call office for the following: Temperature greater than 101F Persistent nausea and vomiting Severe uncontrolled  pain Redness, tenderness, or signs of infection (pain, swelling, redness, odor or green/yellow discharge around the site) Difficulty breathing, headache or visual disturbances Hives Persistent dizziness or light-headedness Extreme fatigue Any other questions or concerns you may have after discharge  In an emergency, call 911 or go to an Emergency Department at a nearby hospital  OTHER HELPFUL INFORMATION  You should wean off your narcotic medicines as soon as you are able.  Most patients will be off or using minimal narcotics before their first postop appointment.   Do not drink alcoholic beverages or take illicit drugs when taking pain medications.  In most states it is against the law to drive while you are in a splint or sling.  And certainly against the law to drive while taking narcotics.  You may return to work/school in the next couple of days when you feel up to it.   Pain medication may make you constipated.  Below are a few solutions to try in this order: Decrease the amount of pain medication if you arent having pain. Drink lots of decaffeinated fluids. Drink prune juice and/or each dried prunes  If the first 3 dont work start with additional solutions Take Colace -  an over-the-counter stool softener Take Senokot - an over-the-counter laxative Take Miralax - a stronger over-the-counter laxative

## 2023-12-21 LAB — BASIC METABOLIC PANEL WITH GFR
Anion gap: 7 (ref 5–15)
BUN: 17 mg/dL (ref 8–23)
CO2: 28 mmol/L (ref 22–32)
Calcium: 7.9 mg/dL — ABNORMAL LOW (ref 8.9–10.3)
Chloride: 99 mmol/L (ref 98–111)
Creatinine, Ser: 0.82 mg/dL (ref 0.44–1.00)
GFR, Estimated: >60 mL/min (ref >60)
Glucose, Bld: 126 mg/dL — ABNORMAL HIGH (ref 70–99)
Potassium: 3.8 mmol/L (ref 3.5–5.1)
Sodium: 134 mmol/L — ABNORMAL LOW (ref 135–145)

## 2023-12-21 LAB — CBC
HCT: 31.6 % — ABNORMAL LOW (ref 36.0–46.0)
Hemoglobin: 10.5 g/dL — ABNORMAL LOW (ref 12.0–15.0)
MCH: 31.6 pg (ref 26.0–34.0)
MCHC: 33.2 g/dL (ref 30.0–36.0)
MCV: 95.2 fL (ref 80.0–100.0)
Platelets: 112 K/uL — ABNORMAL LOW (ref 150–400)
RBC: 3.32 MIL/uL — ABNORMAL LOW (ref 3.87–5.11)
RDW: 12.1 % (ref 11.5–15.5)
WBC: 8.6 K/uL (ref 4.0–10.5)
nRBC: 0 % (ref 0.0–0.2)

## 2023-12-21 MED ORDER — ORAL CARE MOUTH RINSE: 15.0000 mL | OROMUCOSAL | Status: DC | PRN

## 2023-12-21 MED ORDER — SODIUM CHLORIDE 0.9 % IV SOLN: INTRAVENOUS | Status: AC

## 2023-12-21 MED ORDER — MAGNESIUM HYDROXIDE 400 MG/5ML PO SUSP
15.0000 mL | Freq: Once | ORAL | Status: AC
  Administered 2023-12-21: 15 mL via ORAL
  Filled 2023-12-21: qty 30

## 2023-12-21 NOTE — Progress Notes (Signed)
 Assessment & Plan: HD#4 - Mechanical fall (garage steps) Left 3-7 rib fxs with trace PTX  - multimodal pain control, pulm toilet, IS Left clavicle fx  - sling, non-op mgmt per Dr. Kendal, NWB Chronic T12 compression fx  - pt reports this is from 3-4 years ago Left pubic rami fxs with extension into acetabulum Left pubic bone fx Right inferior pubic ramus fx - per ortho Dr. Kendal, attempt Non-op mgmt with TDWB LLE Left tibial plateau fx with moderate lipohemarthrosis  - KI to L knee, TDWB  Hx of breast CA s/p bilateral mastectomy and chemo  Chemo related neuropathy  Osteoporosis   FEN: reg diet VTE: LMWH ID: no current indication for abx   Dispo: PT/OT  - Rehab to re-evaluate        Krystal Spinner, MD [facility] practice Office: [phone]        Chief Complaint: Fall  Subjective: Patient in bed, daughter at bedside  Objective: Vital signs in last 24 hours: Temp:  [97.8 F (36.6 C)-100 F (37.8 C)] 97.9 F (36.6 C) (12/13 0759) Pulse Rate:  [77-101] 78 (12/13 0759) Resp:  [15-19] 19 (12/13 0759) BP: (87-106)/(49-67) 94/49 (12/13 0759) SpO2:  [90 %-94 %] 94 % (12/13 0759) Last BM Date : 12/17/23  Intake/Output from previous day: 12/12 0701 - 12/13 0700 In: 225 [I.V.:225] Out: 300 [Urine:300] Intake/Output this shift: No intake/output data recorded.  Physical Exam: HEENT - sclerae clear, mucous membranes moist Neck - soft Abdomen - soft, non-distended Ext - neuro intact  Lab Results:  Recent Labs    12/20/23 0329 12/21/23 0130  WBC 10.2 8.6  HGB 12.0 10.5*  HCT 36.4 31.6*  PLT 111* 112*   BMET Recent Labs    12/20/23 0329 12/21/23 0130  NA 136 134*  K 3.8 3.8  CL 104 99  CO2 27 28  GLUCOSE 119* 126*  BUN 20 17  CREATININE 0.75 0.82  CALCIUM  8.0* 7.9*   PT/INR No results for input(s): LABPROT, INR in the last 72 hours. Comprehensive Metabolic Panel:    Component Value Date/Time   NA 134 (L)  12/21/2023 0130   NA 136 12/20/2023 0329   NA 147 (H) 12/18/2016 1125   NA 143 06/13/2016 0947   NA 143 12/14/2015 0932   NA 143 06/13/2015 0947   K 3.8 12/21/2023 0130   K 3.8 12/20/2023 0329   K 4.2 12/18/2016 1125   K 4.3 06/13/2016 0947   K 4.3 12/14/2015 0932   K 3.9 06/13/2015 0947   CL 99 12/21/2023 0130   CL 104 12/20/2023 0329   CL 103 12/18/2016 1125   CL 106 06/13/2016 0947   CO2 28 12/21/2023 0130   CO2 27 12/20/2023 0329   CO2 30 12/18/2016 1125   CO2 31 06/13/2016 0947   CO2 27 12/14/2015 0932   CO2 30 (H) 06/13/2015 0947   BUN 17 12/21/2023 0130   BUN 20 12/20/2023 0329   BUN 11 12/18/2016 1125   BUN 10 06/13/2016 0947   BUN 10.2 12/14/2015 0932   BUN 14.7 06/13/2015 0947   CREATININE 0.82 12/21/2023 0130   CREATININE 0.75 12/20/2023 0329   CREATININE 0.71 12/04/2023 1033   CREATININE 0.69 05/16/2023 1012   CREATININE 0.9 12/18/2016 1125   CREATININE 0.8 06/13/2016 0947   CREATININE 0.8 12/14/2015 0932   CREATININE 0.8 06/13/2015 0947   GLUCOSE 126 (H) 12/21/2023 0130   GLUCOSE 119 (H) 12/20/2023 0329   GLUCOSE  101 12/18/2016 1125   GLUCOSE 109 06/13/2016 0947   CALCIUM  7.9 (L) 12/21/2023 0130   CALCIUM  8.0 (L) 12/20/2023 0329   CALCIUM  10.3 12/18/2016 1125   CALCIUM  9.5 06/13/2016 0947   CALCIUM  10.1 12/14/2015 0932   CALCIUM  9.7 06/13/2015 0947   AST 22 12/04/2023 1033   AST 20 05/16/2023 1012   AST 32 12/14/2015 0932   AST 22 06/13/2015 0947   ALT 16 12/04/2023 1033   ALT 17 05/16/2023 1012   ALT 32 12/18/2016 1125   ALT 41 06/13/2016 0947   ALT 35 12/14/2015 0932   ALT 24 06/13/2015 0947   ALKPHOS 93 12/04/2023 1033   ALKPHOS 86 05/16/2023 1012   ALKPHOS 73 12/18/2016 1125   ALKPHOS 69 06/13/2016 0947   ALKPHOS 75 12/14/2015 0932   ALKPHOS 63 06/13/2015 0947   BILITOT 0.7 12/04/2023 1033   BILITOT 0.6 05/16/2023 1012   BILITOT 0.82 12/14/2015 0932   BILITOT 0.80 06/13/2015 0947   PROT 6.8 12/04/2023 1033   PROT 6.7 05/16/2023 1012    PROT 6.9 12/18/2016 1125   PROT 6.6 06/13/2016 0947   PROT 6.8 12/14/2015 0932   PROT 6.8 06/13/2015 0947   ALBUMIN 4.3 12/04/2023 1033   ALBUMIN 4.4 05/16/2023 1012   ALBUMIN 3.8 12/18/2016 1125   ALBUMIN 3.5 06/13/2016 0947   ALBUMIN 3.7 12/14/2015 0932   ALBUMIN 3.7 06/13/2015 0947    Studies/Results: DG CHEST PORT 1 VIEW Result Date: 12/20/2023 EXAM: 1 VIEW(S) XRAY OF THE CHEST 12/20/2023 07:33:00 AM COMPARISON: Portable chest yesterday at 7:14 am. CLINICAL HISTORY: Pulmonary edema. FINDINGS: LINES, TUBES AND DEVICES: Overlying telemetry leads. LUNGS AND PLEURA: There are continued low lung volumes with basilar haziness, which could be atelectasis or infiltrates. No new lung opacity is seen, and the remaining lungs are clear. No pleural effusion. No pneumothorax. HEART AND MEDIASTINUM: No acute abnormality of the cardiac and mediastinal silhouettes. BONES AND SOFT TISSUES: There are left axillary surgical clips, overlying breast implants. Osteopenia. No acute osseous abnormality. IMPRESSION: 1. Continued low lung volumes with basilar haziness, which could reflect atelectasis or infiltrates. 2. No new lung opacity. Electronically signed by: Francis Quam MD 12/20/2023 07:39 AM EST RP Workstation: [id number]      Krystal Spinner 12/21/2023  Patient ID: Margaret English, female   DOB: [birth year], 78 y.o.   MRN: [id number]

## 2023-12-21 NOTE — Evaluation (Signed)
 Occupational Therapy Evaluation Patient Details Name: JOEANNA HOWDYSHELL MRN: [id number] DOB: 1945-07-05 Today's Date: 12/21/2023   History of Present Illness   78 y.o. female presents to [facility] hospital on 12/18/2023 after a fall. Imaging notable for L PTX, L rib 3-7 fx, L clavicle fx, T12 compression fx (chronic), multiple L pelvic fxs and R inferior pubic rami fxs, L tibial plateau fx. PMH includes breast CA s/p mastectomy, cataract, CKD, osteoporosis.     Clinical Impressions This 78 yo female admitted with above presents to acute OT with PLOF of being totally independent with all basic ADLs, IADLs, working, and driving. Currently she is setup-total A +2 for basic ADLs and Max A-total A +2 for bed mobility. She will continue to benefit from acute OT with follow up from intensive inpatient follow-up therapy, >3 hours/day.      If plan is discharge home, recommend the following:   Two people to help with walking and/or transfers;Two people to help with bathing/dressing/bathroom;Assistance with cooking/housework;Help with stairs or ramp for entrance;Assist for transportation     Functional Status Assessment   Patient has had a recent decline in their functional status and demonstrates the ability to make significant improvements in function in a reasonable and predictable amount of time.     Equipment Recommendations   Other (comment) (TBD next venue)      Precautions/Restrictions   Precautions Precautions: Fall Recall of Precautions/Restrictions: Intact Required Braces or Orthoses: Sling;Knee Immobilizer - Left Knee Immobilizer - Left: On at all times Restrictions Weight Bearing Restrictions Per Provider Order: Yes LUE Weight Bearing Per Provider Order: Non weight bearing LLE Weight Bearing Per Provider Order: Touchdown weight bearing     Mobility Bed Mobility Overal bed mobility: Needs Assistance Bed Mobility: Supine to Sit, Sit to Supine     Supine to sit: HOB  elevated, Used rails, Max assist Sit to supine: Total assist, +2 for physical assistance   General bed mobility comments: A for LLE and use of bed pad to help scoot hips around for up to EOB. To lay back down total A +2 one for trunk and one for legs    Transfers                          Balance Overall balance assessment: Needs assistance Sitting-balance support: Single extremity supported, Feet supported Sitting balance-Leahy Scale: Fair Sitting balance - Comments: Pt able to sit EOB for ~10 minutes                                   ADL either performed or assessed with clinical judgement   ADL Overall ADL's : Needs assistance/impaired Eating/Feeding: Set up;Sitting   Grooming: Moderate assistance;Sitting   Upper Body Bathing: Moderate assistance;Sitting   Lower Body Bathing: Total assistance;Bed level   Upper Body Dressing : Total assistance;Sitting   Lower Body Dressing: Total assistance;Bed level                       Vision Patient Visual Report: No change from baseline              Pertinent Vitals/Pain Pain Assessment Pain Assessment: 0-10 Pain Score: 8  Pain Location: left lower back (rib area); R groin and  L shoulder did hurt as well, but not as bad as rib area Pain Descriptors / Indicators: Aching, Grimacing, Guarding, Sore Pain  Intervention(s): Limited activity within patient's tolerance, Monitored during session, Repositioned     Extremity/Trunk Assessment Upper Extremity Assessment Upper Extremity Assessment: LUE deficits/detail LUE Deficits / Details: LUE in sling due to clavicle fx;  elbow ROM appears functional with attempts to reposition sling LUE Coordination: decreased gross motor           Communication Communication Communication: No apparent difficulties   Cognition Arousal: Alert Behavior During Therapy: WFL for tasks assessed/performed                                 Following  commands: Intact       Cueing  Cueing Techniques: Verbal cues              Home Living Family/patient expects to be discharged to:: Private residence Living Arrangements: Spouse/significant other;Other relatives Available Help at Discharge: Family;Available 24 hours/day Type of Home: House Home Access: Stairs to enter [employer] of Steps: 1 Entrance Stairs-Rails: None Home Layout: One level     Bathroom Shower/Tub: Producer, Television/film/video: Standard     Home Equipment: Shower seat - built in          Prior Functioning/Environment Prior Level of Function : Independent/Modified Independent;Working/employed;Driving             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 Problem List: Decreased strength;Decreased range of motion;Impaired balance (sitting and/or standing);Decreased knowledge of use of DME or AE;Impaired UE functional use;Pain   OT Treatment/Interventions: Self-care/ADL training;DME and/or AE instruction;Balance training;Patient/family education;Therapeutic activities      OT Goals(Current goals can be found in the care plan section)   Acute Rehab OT Goals Patient Stated Goal: to be able to move better before going home OT Goal Formulation: With patient/family Time For Goal Achievement: 01/04/24 Potential to Achieve Goals: Good   OT Frequency:  Min 2X/week       AM-PAC OT 6 Clicks Daily Activity     Outcome Measure Help from another person eating meals?: A Little Help from another person taking care of personal grooming?: A Lot Help from another person toileting, which includes using toliet, bedpan, or urinal?: Total Help from another person bathing (including washing, rinsing, drying)?: A Lot Help from another person to put on and taking off regular upper body clothing?: Total Help from another person to put on and taking off regular lower body clothing?: Total 6 Click Score: 10   End of Session     Activity Tolerance: Patient tolerated treatment well Patient left: in bed;with call bell/phone within reach;with family/visitor present  OT Visit Diagnosis: Other abnormalities of gait and mobility (R26.89);Muscle weakness (generalized) (M62.81);Pain Pain - part of body:  (left shoulder, left LE, R groin, left ribs)                Time: 8546-8470 OT Time Calculation (min): 36 min Charges:  OT General Charges $OT Visit: 1 Visit OT Evaluation $OT Eval Moderate Complexity: 1 Mod OT Treatments $Self Care/Home Management : 8-22 mins Donny BECKER OT Acute Rehabilitation Services Office [phone]    [place] Dorothyann Distel 12/21/2023, 4:15 PM

## 2023-12-21 NOTE — Progress Notes (Signed)
°   12/21/23 0305  Vitals  Temp 97.8 F (36.6 C)  Temp Source Oral  BP (!) 87/53  MAP (mmHg) (!) 63  BP Location Right Arm  BP Method Automatic  Patient Position (if appropriate) Lying  Pulse Rate 77  Pulse Rate Source Monitor  Resp 16  Level of Consciousness  Level of Consciousness Alert  MEWS COLOR  MEWS Score Color Green  Oxygen Therapy  SpO2 91 %  MEWS Score  MEWS Temp 0  MEWS Systolic 1  MEWS Pulse 0  MEWS RR 0  MEWS LOC 0  MEWS Score 1   Reached to on call provider d/t patient running soft pressures. Obtained new orders for IVF. See mar for details.

## 2023-12-22 ENCOUNTER — Inpatient Hospital Stay (HOSPITAL_COMMUNITY)

## 2023-12-22 LAB — BASIC METABOLIC PANEL WITH GFR
Anion gap: 9 (ref 5–15)
BUN: 17 mg/dL (ref 8–23)
CO2: 27 mmol/L (ref 22–32)
Calcium: 8.5 mg/dL — ABNORMAL LOW (ref 8.9–10.3)
Chloride: 104 mmol/L (ref 98–111)
Creatinine, Ser: 0.64 mg/dL (ref 0.44–1.00)
GFR, Estimated: >60 mL/min (ref >60)
Glucose, Bld: 104 mg/dL — ABNORMAL HIGH (ref 70–99)
Potassium: 4.1 mmol/L (ref 3.5–5.1)
Sodium: 140 mmol/L (ref 135–145)

## 2023-12-22 LAB — CBC
HCT: 32.3 % — ABNORMAL LOW (ref 36.0–46.0)
Hemoglobin: 10.5 g/dL — ABNORMAL LOW (ref 12.0–15.0)
MCH: 32.4 pg (ref 26.0–34.0)
MCHC: 32.5 g/dL (ref 30.0–36.0)
MCV: 99.7 fL (ref 80.0–100.0)
Platelets: 129 K/uL — ABNORMAL LOW (ref 150–400)
RBC: 3.24 MIL/uL — ABNORMAL LOW (ref 3.87–5.11)
RDW: 12.1 % (ref 11.5–15.5)
WBC: 5.6 K/uL (ref 4.0–10.5)
nRBC: 0 % (ref 0.0–0.2)

## 2023-12-22 MED ORDER — BISACODYL 10 MG RE SUPP
10.0000 mg | Freq: Every day | RECTAL | Status: DC | PRN
  Administered 2023-12-25: 16:00:00 10 mg via RECTAL
  Filled 2023-12-22 (×2): qty 1

## 2023-12-22 NOTE — Progress Notes (Signed)
 Assessment & Plan: HD#5 - Mechanical fall (garage steps) Left 3-7 rib fxs with trace PTX  - multimodal pain control, pulm toilet, IS Left clavicle fx  - sling, non-op mgmt per Dr. Kendal, NWB Chronic T12 compression fx  - pt reports this is from 3-4 years ago Left pubic rami fxs with extension into acetabulum Left pubic bone fx Right inferior pubic ramus fx - per ortho Dr. Kendal, attempt Non-op mgmt with TDWB LLE Left tibial plateau fx with moderate lipohemarthrosis  - KI to L knee, TDWB   Hx of breast CA s/p bilateral mastectomy and chemo  Chemo related neuropathy  Osteoporosis   FEN: reg diet VTE: LMWH ID: no current indication for abx   Dispo: PT/OT - productive session yesterday with OT (see report).  Hopefully progress this week with PT and OT so that may be considered for in-patient rehab again.  Discussed with daughter and patient at bedside.  Constipation - on Colace and Miralax .  Nausea with MOM.  Will try suppository today.        Krystal Spinner, MD [facility] practice Office: [phone]        Chief Complaint: Fall  Subjective: Patient in bed, nursing giving bath.  Daughter at bedside.  Objective: Vital signs in last 24 hours: Temp:  [97.6 F (36.4 C)-98.7 F (37.1 C)] 98.5 F (36.9 C) (12/14 0741) Pulse Rate:  [87-103] 87 (12/14 0741) Resp:  [18-22] 22 (12/14 0741) BP: (115-135)/(56-72) 135/65 (12/14 0741) SpO2:  [91 %-95 %] 95 % (12/14 0741) Last BM Date : 12/17/23  Intake/Output from previous day: 12/13 0701 - 12/14 0700 In: 1902 [I.V.:1902] Out: 750 [Urine:750] Intake/Output this shift: Total I/O In: -  Out: 30 [Emesis/NG output:30]  Physical Exam: HEENT - sclerae clear, mucous membranes moist Ext - LLE immobilizer and dressing in place  Lab Results:  Recent Labs    12/21/23 0130 12/22/23 0510  WBC 8.6 5.6  HGB 10.5* 10.5*  HCT 31.6* 32.3*  PLT 112* 129*   BMET Recent Labs    12/21/23 0130  12/22/23 0510  NA 134* 140  K 3.8 4.1  CL 99 104  CO2 28 27  GLUCOSE 126* 104*  BUN 17 17  CREATININE 0.82 0.64  CALCIUM  7.9* 8.5*   PT/INR No results for input(s): LABPROT, INR in the last 72 hours. Comprehensive Metabolic Panel:    Component Value Date/Time   NA 140 12/22/2023 0510   NA 134 (L) 12/21/2023 0130   NA 147 (H) 12/18/2016 1125   NA 143 06/13/2016 0947   NA 143 12/14/2015 0932   NA 143 06/13/2015 0947   K 4.1 12/22/2023 0510   K 3.8 12/21/2023 0130   K 4.2 12/18/2016 1125   K 4.3 06/13/2016 0947   K 4.3 12/14/2015 0932   K 3.9 06/13/2015 0947   CL 104 12/22/2023 0510   CL 99 12/21/2023 0130   CL 103 12/18/2016 1125   CL 106 06/13/2016 0947   CO2 27 12/22/2023 0510   CO2 28 12/21/2023 0130   CO2 30 12/18/2016 1125   CO2 31 06/13/2016 0947   CO2 27 12/14/2015 0932   CO2 30 (H) 06/13/2015 0947   BUN 17 12/22/2023 0510   BUN 17 12/21/2023 0130   BUN 11 12/18/2016 1125   BUN 10 06/13/2016 0947   BUN 10.2 12/14/2015 0932   BUN 14.7 06/13/2015 0947   CREATININE 0.64 12/22/2023 0510   CREATININE 0.82 12/21/2023 0130  CREATININE 0.71 12/04/2023 1033   CREATININE 0.69 05/16/2023 1012   CREATININE 0.9 12/18/2016 1125   CREATININE 0.8 06/13/2016 0947   CREATININE 0.8 12/14/2015 0932   CREATININE 0.8 06/13/2015 0947   GLUCOSE 104 (H) 12/22/2023 0510   GLUCOSE 126 (H) 12/21/2023 0130   GLUCOSE 101 12/18/2016 1125   GLUCOSE 109 06/13/2016 0947   CALCIUM  8.5 (L) 12/22/2023 0510   CALCIUM  7.9 (L) 12/21/2023 0130   CALCIUM  10.3 12/18/2016 1125   CALCIUM  9.5 06/13/2016 0947   CALCIUM  10.1 12/14/2015 0932   CALCIUM  9.7 06/13/2015 0947   AST 22 12/04/2023 1033   AST 20 05/16/2023 1012   AST 32 12/14/2015 0932   AST 22 06/13/2015 0947   ALT 16 12/04/2023 1033   ALT 17 05/16/2023 1012   ALT 32 12/18/2016 1125   ALT 41 06/13/2016 0947   ALT 35 12/14/2015 0932   ALT 24 06/13/2015 0947   ALKPHOS 93 12/04/2023 1033   ALKPHOS 86 05/16/2023 1012    ALKPHOS 73 12/18/2016 1125   ALKPHOS 69 06/13/2016 0947   ALKPHOS 75 12/14/2015 0932   ALKPHOS 63 06/13/2015 0947   BILITOT 0.7 12/04/2023 1033   BILITOT 0.6 05/16/2023 1012   BILITOT 0.82 12/14/2015 0932   BILITOT 0.80 06/13/2015 0947   PROT 6.8 12/04/2023 1033   PROT 6.7 05/16/2023 1012   PROT 6.9 12/18/2016 1125   PROT 6.6 06/13/2016 0947   PROT 6.8 12/14/2015 0932   PROT 6.8 06/13/2015 0947   ALBUMIN 4.3 12/04/2023 1033   ALBUMIN 4.4 05/16/2023 1012   ALBUMIN 3.8 12/18/2016 1125   ALBUMIN 3.5 06/13/2016 0947   ALBUMIN 3.7 12/14/2015 0932   ALBUMIN 3.7 06/13/2015 0947    Studies/Results: No results found.    Krystal Spinner 12/22/2023  Patient ID: Margaret English, female   DOB: 1945-03-01, 78 y.o.   MRN: [id number]

## 2023-12-23 LAB — BASIC METABOLIC PANEL WITH GFR
Anion gap: 6 (ref 5–15)
BUN: 16 mg/dL (ref 8–23)
CO2: 27 mmol/L (ref 22–32)
Calcium: 8.3 mg/dL — ABNORMAL LOW (ref 8.9–10.3)
Chloride: 106 mmol/L (ref 98–111)
Creatinine, Ser: 0.39 mg/dL — ABNORMAL LOW (ref 0.44–1.00)
GFR, Estimated: >60 mL/min (ref >60)
Glucose, Bld: 104 mg/dL — ABNORMAL HIGH (ref 70–99)
Potassium: 4.1 mmol/L (ref 3.5–5.1)
Sodium: 139 mmol/L (ref 135–145)

## 2023-12-23 LAB — CBC
HCT: 30.3 % — ABNORMAL LOW (ref 36.0–46.0)
Hemoglobin: 10.2 g/dL — ABNORMAL LOW (ref 12.0–15.0)
MCH: 32.7 pg (ref 26.0–34.0)
MCHC: 33.7 g/dL (ref 30.0–36.0)
MCV: 97.1 fL (ref 80.0–100.0)
Platelets: 156 K/uL (ref 150–400)
RBC: 3.12 MIL/uL — ABNORMAL LOW (ref 3.87–5.11)
RDW: 12.3 % (ref 11.5–15.5)
WBC: 5.8 K/uL (ref 4.0–10.5)
nRBC: 0 % (ref 0.0–0.2)

## 2023-12-23 NOTE — Progress Notes (Signed)
 Physical Therapy Treatment Patient Details Name: Margaret English MRN: [id number] DOB: 06/04/1945 Today's Date: 12/23/2023   History of Present Illness 78 y.o. F adm 12/18/23 after a fall with Lt PTX, Lt 3-7 rib fx, Lt clavicle fx, T12 compression fx (chronic), multiple Lt pelvic fxs and Rt inferior pubic rami fxs, Lt tibial plateau fx. PMHx: breast CA s/p mastectomy, cataract, CKD, osteoporosis.    PT Comments  Pt pleasant and very willing to attempt mobility. Pt reports left shoulder and pelvic pain with movement but pain free end of session at rest. Pt with significant progression able to perform scooting and repeated standing trials this date. Pt educated for plan for OOB next session and encouraged continued attempts of movement of LLE while in bed. Patient will benefit from intensive inpatient follow-up therapy, >3 hours/day  92% on 1.5L HR 120    If plan is discharge home, recommend the following: Two people to help with walking and/or transfers;Two people to help with bathing/dressing/bathroom;Assistance with cooking/housework;Assist for transportation;Help with stairs or ramp for entrance   Can travel by private vehicle        Equipment Recommendations  Wheelchair (measurements PT);Wheelchair cushion (measurements PT);Hospital bed    Recommendations for Other Services       Precautions / Restrictions Precautions Precautions: Fall Recall of Precautions/Restrictions: Intact Required Braces or Orthoses: Sling;Knee Immobilizer - Left Knee Immobilizer - Left: On at all times Restrictions LUE Weight Bearing Per Provider Order: Non weight bearing LLE Weight Bearing Per Provider Order: Touchdown weight bearing     Mobility  Bed Mobility Overal bed mobility: Needs Assistance Bed Mobility: Supine to Sit, Sit to Supine     Supine to sit: Mod assist Sit to supine: Mod assist   General bed mobility comments: mod assist to pivot to Rt side of bed with assist to pivot LLE and  elevate trunk from surface, mod assist to pivot pelvis. Mod assist to lift legs to surface with return to bed, pt able to scoot herself to [facility] with RLE and bed in trendelenburg    Transfers Overall transfer level: Needs assistance   Transfers: Sit to/from Stand, Bed to chair/wheelchair/BSC Sit to Stand: Mod assist Stand pivot transfers: Max assist         General transfer comment: mod assist to rise from bed and bSC total of 4 trials with assist of belt/pad to rise with pillow under LLE to prevent WB. Pt pivoted to and from [facility] with assist to control pivot and max cues for hand placement and safety. Daughter present to assist with lines and safety. Pt able to scoot along EOB 6 with pad, mod assist and cues for sequence    Ambulation/Gait               General Gait Details: not yet able   Stairs             Wheelchair Mobility     Tilt Bed    Modified Rankin (Stroke Patients Only)       Balance Overall balance assessment: Needs assistance Sitting-balance support: Feet supported, Single extremity supported Sitting balance-Leahy Scale: Fair     Standing balance support: Single extremity supported, During functional activity Standing balance-Leahy Scale: Poor Standing balance comment: RUE support and physical assist for balance                            Communication Communication Communication: No apparent difficulties  Cognition Arousal: Alert  Behavior During Therapy: Flat affect   PT - Cognitive impairments: Problem solving                       PT - Cognition Comments: pt with slight delay in response and reports feeling a bit groggy from pain meds Following commands: Intact      Cueing Cueing Techniques: Verbal cues, Gestural cues  Exercises      General Comments        Pertinent Vitals/Pain Pain Assessment Pain Assessment: 0-10 Pain Score: 6  Pain Location: pelvis with movement Pain Descriptors / Indicators:  Aching, Grimacing, Guarding, Sore Pain Intervention(s): Limited activity within patient's tolerance, Monitored during session, Repositioned, Premedicated before session    Home Living                          Prior Function            PT Goals (current goals can now be found in the care plan section) Progress towards PT goals: Progressing toward goals    Frequency    Min 3X/week      PT Plan      Co-evaluation              AM-PAC PT 6 Clicks Mobility   Outcome Measure  Help needed turning from your back to your side while in a flat bed without using bedrails?: A Lot Help needed moving from lying on your back to sitting on the side of a flat bed without using bedrails?: A Lot Help needed moving to and from a bed to a chair (including a wheelchair)?: A Lot Help needed standing up from a chair using your arms (e.g., wheelchair or bedside chair)?: A Lot Help needed to walk in hospital room?: Total Help needed climbing 3-5 steps with a railing? : Total 6 Click Score: 10    End of Session Equipment Utilized During Treatment: Left knee immobilizer;Gait belt;Oxygen Activity Tolerance: Patient tolerated treatment well Patient left: in bed;with call bell/phone within reach;with family/visitor present Nurse Communication: Mobility status;Need for lift equipment PT Visit Diagnosis: Other abnormalities of gait and mobility (R26.89);Muscle weakness (generalized) (M62.81);Pain Pain - Right/Left: Left Pain - part of body: Shoulder     Time: 0920-0950 PT Time Calculation (min) (ACUTE ONLY): 30 min  Charges:    $Therapeutic Activity: 23-37 mins PT General Charges $$ ACUTE PT VISIT: 1 Visit                     Lenoard SQUIBB, PT Acute Rehabilitation Services Office: [phone]    Gayna Braddy B Mazie Fencl 12/23/2023, 10:09 AM

## 2023-12-23 NOTE — Progress Notes (Signed)
 Subjective: Had a rough night as she began having many BMs.  She had some nausea with all the medications given to her for this.  This is improving with phenergan  overnight.  Having pain from her ribs mostly.  Daughter at bedside.  Voiding.  ROS: See above, otherwise other systems negative  Objective: Vital signs in last 24 hours: Temp:  [98.2 F (36.8 C)-98.9 F (37.2 C)] 98.4 F (36.9 C) (12/15 0737) Pulse Rate:  [81-93] 93 (12/15 0737) Resp:  [16-20] 20 (12/15 0737) BP: (95-136)/(56-84) 132/84 (12/15 0737) SpO2:  [93 %-97 %] 97 % (12/15 0538) Last BM Date : 12/22/23  Intake/Output from previous day: 12/14 0701 - 12/15 0700 In: 50 [IV Piggyback:50] Out: 430 [Urine:400; Emesis/NG output:30] Intake/Output this shift: No intake/output data recorded.  PE: Gen: NAD, lying in bed HEENT: PERRL Heart: regular  Lungs: CTA, respiratory effort nonlabored, pain in her left chest as expected Abd: soft, NT Ext: able to moved BUEs, specifically LUE fairly well given clavicle fx on left.  + radial pulses.  Moves BLE.  KI on left knee.  Moves her toes with normal sensation, All extremities are WWP Psych: A&Ox3  Lab Results:  Recent Labs    12/22/23 0510 12/23/23 0546  WBC 5.6 5.8  HGB 10.5* 10.2*  HCT 32.3* 30.3*  PLT 129* 156   BMET Recent Labs    12/22/23 0510 12/23/23 0546  NA 140 139  K 4.1 4.1  CL 104 106  CO2 27 27  GLUCOSE 104* 104*  BUN 17 16  CREATININE 0.64 0.39*  CALCIUM  8.5* 8.3*   PT/INR No results for input(s): LABPROT, INR in the last 72 hours. CMP     Component Value Date/Time   NA 139 12/23/2023 0546   NA 147 (H) 12/18/2016 1125   NA 143 12/14/2015 0932   K 4.1 12/23/2023 0546   K 4.2 12/18/2016 1125   K 4.3 12/14/2015 0932   CL 106 12/23/2023 0546   CL 103 12/18/2016 1125   CO2 27 12/23/2023 0546   CO2 30 12/18/2016 1125   CO2 27 12/14/2015 0932   GLUCOSE 104 (H) 12/23/2023 0546   GLUCOSE 101 12/18/2016 1125   BUN 16  12/23/2023 0546   BUN 11 12/18/2016 1125   BUN 10.2 12/14/2015 0932   CREATININE 0.39 (L) 12/23/2023 0546   CREATININE 0.71 12/04/2023 1033   CREATININE 0.9 12/18/2016 1125   CREATININE 0.8 12/14/2015 0932   CALCIUM  8.3 (L) 12/23/2023 0546   CALCIUM  10.3 12/18/2016 1125   CALCIUM  10.1 12/14/2015 0932   PROT 6.8 12/04/2023 1033   PROT 6.9 12/18/2016 1125   PROT 6.8 12/14/2015 0932   ALBUMIN 4.3 12/04/2023 1033   ALBUMIN 3.8 12/18/2016 1125   ALBUMIN 3.7 12/14/2015 0932   AST 22 12/04/2023 1033   AST 32 12/14/2015 0932   ALT 16 12/04/2023 1033   ALT 32 12/18/2016 1125   ALT 35 12/14/2015 0932   ALKPHOS 93 12/04/2023 1033   ALKPHOS 73 12/18/2016 1125   ALKPHOS 75 12/14/2015 0932   BILITOT 0.7 12/04/2023 1033   BILITOT 0.82 12/14/2015 0932   GFRNONAA >60 12/23/2023 0546   GFRNONAA >60 12/04/2023 1033   GFRAA >60 07/09/2019 1144   Lipase  No results found for: LIPASE     Studies/Results: DG Abd 1 View Result Date: 12/22/2023 CLINICAL DATA:  [id number] Abdominal pain [id number] EXAM: ABDOMEN - 1 VIEW COMPARISON:  July 31, 2016 FINDINGS: Air and stool-filled nondilated  loops of bowel. Mild-to-moderate colonic stool burden diffusely throughout the colon. Bibasilar reticulonodular opacities, likely atelectasis. Pelvic phleboliths. Vascular calcifications. Degenerative changes of the lumbar spine. Asymmetric sclerosis of the LEFT sacrum likely reflecting a nondisplaced sacral fracture. Revisualization of a LEFT superior pubic ramus fracture. IMPRESSION: 1. Nonobstructive bowel gas pattern. 2. Asymmetric sclerosis of the LEFT sacrum likely reflecting a nondisplaced sacral fracture. Revisualization of a LEFT superior pubic ramus fracture. Electronically Signed   By: Corean Salter M.D.   On: 12/22/2023 19:34    Anti-infectives: Anti-infectives (From admission, onward)    None        Assessment/Plan Mechanical fall (garage steps) Left 3-7 rib fxs with trace PTX - multimodal pain  control, pulm toilet, IS Left clavicle fx - sling, non-op mgmt per Dr. Kendal, NWB Chronic T12 compression fx - pt reports this is from 3-4 years ago Left pubic rami fxs with extension into acetabulum Left pubic bone fx Right inferior pubic ramus fx - per ortho Dr. Kendal, attempt Non-op mgmt with TDWB LLE Left tibial plateau fx with moderate lipohemarthrosis - KI to L knee, TDWB Hx of breast CA s/p bilateral mastectomy and chemo  Chemo related neuropathy  Osteoporosis   FEN: reg diet, bowel regimen VTE: LMWH ID: no current indication for abx   Dispo: PT/OT - improved yesterday.  Continue with therapies and hope for reassessment by CIR.  Daughter updated at bedside.  I reviewed nursing notes, Consultant ortho notes, last 24 h vitals and pain scores, last 48 h intake and output, last 24 h labs and trends, and last 24 h imaging results.   LOS: 5 days    Burnard FORBES Banter , [facility] Surgery 12/23/2023, 8:32 AM Please see Amion for pager number during day hours 7:00am-4:30pm or 7:00am -11:30am on weekends

## 2023-12-23 NOTE — Plan of Care (Signed)
°  Problem: Education: Goal: Knowledge of General Education information will improve Description: Including pain rating scale, medication(s)/side effects and non-pharmacologic comfort measures Outcome: Progressing   Problem: Health Behavior/Discharge Planning: Goal: Ability to manage health-related needs will improve Outcome: Progressing   Problem: Clinical Measurements: Goal: Ability to maintain clinical measurements within normal limits will improve Outcome: Progressing Goal: Will remain free from infection Outcome: Progressing Goal: Diagnostic test results will improve Outcome: Progressing Goal: Respiratory complications will improve Outcome: Progressing Goal: Cardiovascular complication will be avoided Outcome: Progressing   Problem: Skin Integrity: Goal: Risk for impaired skin integrity will decrease Outcome: Progressing   Problem: Safety: Goal: Ability to remain free from injury will improve Outcome: Progressing   Problem: Pain Managment: Goal: General experience of comfort will improve and/or be controlled Outcome: Progressing   Problem: Elimination: Goal: Will not experience complications related to bowel motility Outcome: Progressing Goal: Will not experience complications related to urinary retention Outcome: Progressing   Problem: Coping: Goal: Level of anxiety will decrease Outcome: Progressing

## 2023-12-23 NOTE — Plan of Care (Signed)

## 2023-12-23 NOTE — Plan of Care (Signed)
   Problem: Activity: Goal: Risk for activity intolerance will decrease Outcome: Progressing   Problem: Nutrition: Goal: Adequate nutrition will be maintained Outcome: Progressing   Problem: Coping: Goal: Level of anxiety will decrease Outcome: Progressing   Problem: Elimination: Goal: Will not experience complications related to bowel motility Outcome: Progressing

## 2023-12-24 LAB — EXPECTORATED SPUTUM ASSESSMENT W GRAM STAIN, RFLX TO RESP C

## 2023-12-24 MED ORDER — HYDROMORPHONE HCL 1 MG/ML IJ SOLN: 0.5000 mg | INTRAMUSCULAR | Status: DC | PRN

## 2023-12-24 MED ORDER — GUAIFENESIN ER 600 MG PO TB12
600.0000 mg | ORAL_TABLET | Freq: Two times a day (BID) | ORAL | Status: DC
  Administered 2023-12-24 – 2024-01-03 (×21): 600 mg via ORAL
  Filled 2023-12-24 (×21): qty 1

## 2023-12-24 MED ORDER — ENSURE PLUS HIGH PROTEIN PO LIQD
237.0000 mL | Freq: Two times a day (BID) | ORAL | Status: DC
  Administered 2023-12-24 – 2024-01-03 (×20): 237 mL via ORAL

## 2023-12-24 NOTE — [place] FL2 (Signed)
 [place]  MEDICAID FL2 LEVEL OF CARE FORM     IDENTIFICATION  Patient Name: Margaret English Birthdate: 18-Jan-1945 Sex: female Admission Date (Current Location): 12/18/2023  [facility] and [place] Number:  Producer, Television/film/video and Address:  [facility], 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   Genital herpes simplex 06/18/2017   Malignant neoplasm of female breast (HCC) 06/18/2017   Mixed hyperlipidemia 06/18/2017   Senile osteoporosis 06/18/2017   Vitamin D  deficiency 06/18/2017    Orientation RESPIRATION BLADDER Height & Weight     Self, Time, Situation, Place  O2 (1.5L) External catheter Weight: 148 lb 13 oz (67.5 kg) Height:  5' 6 (167.6 cm)  BEHAVIORAL SYMPTOMS/MOOD NEUROLOGICAL BOWEL NUTRITION STATUS      Continent Diet (regular)  AMBULATORY STATUS COMMUNICATION OF NEEDS Skin   Extensive Assist Verbally Other (Comment) (traumatic pretibial left - knee mobilizer, gauze; redness)                       Personal Care Assistance Level of Assistance  Bathing, Feeding, Dressing Bathing Assistance: Maximum assistance Feeding assistance: Limited assistance Dressing Assistance: Maximum assistance     Functional Limitations Info  Hearing   Hearing Info: Impaired      SPECIAL CARE FACTORS FREQUENCY  PT (By licensed PT), OT (By licensed OT)     PT Frequency: 5 times a week OT Frequency: 5 times a week             Contractures      Additional Factors Info  Code Status, Allergies Code Status Info: full Allergies Info: Sulfa Antibiotics, Sulfonamide Derivatives, Latex, Cvs Astringent Eye Drops (Tetrahydrozoline-zn Sulfate)           Current Medications (12/24/2023):  This is the current hospital active medication list Current Facility-Administered Medications  Medication Dose Route Frequency Provider Last Rate Last Admin   acetaminophen  (TYLENOL ) tablet 1,000 mg  1,000 mg Oral Q6H Ann Fine, MD   1,000 mg at 12/24/23 9370   bisacodyl  (DULCOLAX) suppository 10 mg  10 mg Rectal Daily PRN Eletha Boas, MD       docusate sodium  (COLACE) capsule 100 mg  100 mg Oral BID Ann Fine, MD   100 mg at 12/24/23 9076   enoxaparin  (LOVENOX ) injection 30 mg  30 mg Subcutaneous Q12H Ann Fine, MD   30 mg at 12/24/23 9076   gabapentin  (NEURONTIN ) capsule 300 mg  300 mg Oral TID Ann Fine, MD   300 mg at 12/24/23 9076   guaiFENesin  (MUCINEX ) 12 hr tablet 600 mg  600 mg Oral BID Maczis, Michael M, PA-C   600 mg at 12/24/23 1206   hydrALAZINE  (APRESOLINE ) injection 10 mg  10 mg Intravenous Q2H PRN Ann Fine, MD       HYDROmorphone  (DILAUDID ) injection 0.5 mg  0.5 mg Intravenous Q4H PRN Maczis, Michael M, PA-C  ibuprofen  (ADVIL ) tablet 600 mg  600 mg Oral TID Augustus Almarie RAMAN, PA-C   600 mg at 12/24/23 9076   lidocaine  (LIDODERM ) 5 % 2 patch  2 patch Transdermal Q24H Vicci Burnard SAUNDERS, PA-C   2 patch at 12/24/23 9074   methocarbamol  (ROBAXIN ) tablet 750 mg  750 mg Oral QID Paola Dreama SAILOR, MD   750 mg at 12/24/23 9076   ondansetron  (ZOFRAN -ODT) disintegrating tablet 4 mg  4 mg Oral Q6H PRN Paola Dreama SAILOR, MD   4 mg at 12/24/23 9247   Or   ondansetron  (ZOFRAN ) injection 4 mg  4 mg Intravenous Q6H PRN Paola Dreama SAILOR, MD       Oral care mouth rinse  15 mL Mouth Rinse PRN Tanda Locus, MD       oxyCODONE  (Oxy IR/ROXICODONE ) immediate release tablet 5-7.5 mg  5-7.5  mg Oral Q4H PRN Lovick, Ayesha N, MD   5 mg at 12/24/23 0749   polyethylene glycol (MIRALAX  / GLYCOLAX ) packet 17 g  17 g Oral Daily Lovick, Ayesha N, MD   17 g at 12/24/23 9075   promethazine  (PHENERGAN ) tablet 25 mg  25 mg Oral Q6H PRN Paola Dreama SAILOR, MD       Or   promethazine  (PHENERGAN ) 12.5 mg in sodium chloride  0.9 % 50 mL IVPB  12.5 mg Intravenous Q6H PRN Paola Dreama SAILOR, MD 150 mL/hr at 12/23/23 0536 12.5 mg at 12/23/23 0536   Or   promethazine  (PHENERGAN ) suppository 25 mg  25 mg Rectal Q6H PRN Paola Dreama SAILOR, MD         Discharge Medications: Please see discharge summary for a list of discharge medications.  Relevant Imaging Results:  Relevant Lab Results:   Additional Information SS #: [id number]  Clester Chlebowski E Ryliee Figge, LCSW

## 2023-12-24 NOTE — PMR Pre-admission (Shared)
 PMR Admission Coordinator Pre-Admission Assessment  Patient: Margaret English is an 78 y.o., female MRN: [id number] DOB: 25-May-1945 Height: 5' 6 (167.6 cm) Weight: 67.5 kg  Insurance Information HMO: ***    PPO: ***     PCP:      IPA:      80/20:      OTHER:  PRIMARY: [employer] Medicare      Policy#: [id number]      Subscriber: pt CM Name: ***      Phone#: ***     Fax#: *** Pre-Cert#: ***      Employer: *** Benefits:  Phone #: ***     Name: *** Margaret English. Date: ***     Deduct: ***      Out of Pocket Max: ***      Life Max: *** CIR: ***      SNF: *** Outpatient: ***     Co-Pay: *** Home Health: ***      Co-Pay: *** DME: ***     Co-Pay: *** Providers:  SECONDARY:       Policy#:      Phone#:   Financial Counselor:       Phone#:   The [employer] for patients in Inpatient Rehabilitation Facilities with attached Privacy Act Statement-Health Care Records was provided and verbally reviewed with: Patient and Family  Emergency Contact Information Contact Information     Name Relation Home Work Mobile   Margaret English [place] [phone]   Margaret English Margaret English   [id number]      Other Contacts     Name Relation Home Work Mobile   Sian,Lorton Spouse   [phone]       Current Medical History  Patient Admitting Diagnosis: polytrauma  History of Present Illness: Pt is a 78 y/o previously independent/working female with PMH of breast cancer s/p mastectomy, CKD, and osteoporosis who was admitted to [facility] on 12/18/[facility] after a fall.  Pt became dizzy in the ED with presyncopal symptoms.  She was hemodynamically stable but with soft BP.  Imaging revealed trace L PTX, L3-7 ribs fx, L clavicle fx, T12 compression fx, L sup/inf pubic rami fractures with extension into anterior acetabulum, L pubic bone fx, and R inf pubic rami fx.  Consults to orthopedics Dr. Kendal with recommendations for nonoperative management of pelvic ring injury and  tibial plateau fx (KI on LLE and TTWB).  She is to be NWB in a sling for her LUE.  Hospital course pain management and bowel regimen.  Therapy ongoing and recommendations are for CIR.  Patients medical and functional status supports admission to an inpatient rehabilitation program. Due to multifactorial deficits, continued progress will require a coordinated, team-based approach with physician oversight to achieve meaningful gains in mobility, ADL independence, and safety.     Patient's medical record from Margaret English has been reviewed by the rehabilitation admission coordinator and physician.  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 Yes  Family History   family history is not on file.  Current Medications Current Medications[1]  Patients Current  Diet:  Diet Order             Diet regular Room service appropriate? Yes; Fluid consistency: Thin  Diet effective now                   Precautions / Restrictions Precautions Precautions: Fall Restrictions Weight Bearing Restrictions Per Provider Order: Yes LUE Weight Bearing Per Provider Order: Non weight bearing LLE Weight Bearing Per Provider Order: Touchdown weight bearing Other Position/Activity Restrictions: per ortho note 12/12 okay for elbow distal ROM L UE   Has the patient had 2 or more falls or a fall with injury in the past year? Yes  Prior Activity Level Community (5-7x/wk): independent, no DME, driving, working as a travel Scientist, Product/process Development Level Self Care: Did the patient need help bathing, dressing, using the toilet or eating? Independent  Indoor Mobility: Did the patient need assistance  with walking from room to room (with or without device)? Independent  Stairs: Did the patient need assistance with internal or external stairs (with or without device)? Independent  Functional Cognition: Did the patient need help planning regular tasks such as shopping or remembering to take medications? Independent  Patient Information Are you of Hispanic, Latino/a,or Spanish origin?: A. No, not of Hispanic, Latino/a, or Spanish origin What is your race?: A. White Do you need or want an interpreter to communicate with a doctor or health care staff?: 0. No  Patient's Response To:  Health Literacy and Transportation Is the patient able to respond to health literacy and transportation needs?: Yes Health Literacy - How often do you need to have someone help you when you read instructions, pamphlets, or other written material from your doctor or pharmacy?: Never In the past 12 months, has lack of transportation kept you from medical appointments or from getting medications?: No In the past 12 months, has lack of transportation kept you from meetings, work, or from getting things needed for daily living?: No  Journalist, Newspaper / Equipment Home Equipment: Shower seat - built in  Prior Device Use: Indicate devices/aids used by the patient prior to current illness, exacerbation or injury? None of the above  Current Functional Level Cognition  Orientation Level: Oriented X4    Extremity Assessment (includes Sensation/Coordination)  Upper Extremity Assessment: LUE deficits/detail LUE Deficits / Details: WFL elbow distal ROM, educated on no shoulder ROM. LUE Coordination: decreased gross motor  Lower Extremity Assessment: Defer to PT evaluation RLE Deficits / Details: ROM WFL, grossly 4/5 LLE Deficits / Details: LLE in knee immobilizer, ankle ROM WFL, hip strength not formally assessed but limited to <3/5 currently with pain seeming to limit    ADLs  Overall ADL's : Needs  assistance/impaired Eating/Feeding: Set up, Sitting Grooming: Minimal assistance, Sitting Upper Body Bathing: Moderate assistance, Sitting Lower Body Bathing: Total assistance, Bed level Upper Body Dressing : Maximal assistance, Sitting Lower Body Dressing: Total assistance, Sit to/from stand Functional mobility during ADLs: Moderate assistance, +2 for physical assistance, Cueing for sequencing, Cueing for safety    Mobility  Overal bed mobility: Needs Assistance Bed Mobility: Supine to Sit Supine to sit: Min assist, HOB elevated, Used rails Sit to supine: Mod assist General bed mobility comments: OOB in recliner upon entry    Transfers  Overall transfer level: Needs assistance Equipment used: 1 person hand held assist Transfers: Sit to/from Stand Sit to Stand: Mod assist, +2 safety/equipment Bed to/from chair/wheelchair/BSC transfer type:: Stand pivot Stand pivot transfers: Mod assist  Lateral/Scoot Transfers:  (  initiated lateral scoots, 3 attempts with lateral movement of ~3 inches total to R side.) General transfer comment: pt sitting in recliner, mod assist to fully power up into standing. pt with cueing for technique and sequencing, adherence to TWB L LE.    Ambulation / Gait / Stairs / Wheelchair Mobility  Ambulation/Gait General Gait Details: not yet able    Posture / Balance Dynamic Sitting Balance Sitting balance - Comments: EOB without support Balance Overall balance assessment: Needs assistance Sitting-balance support: Feet supported, Single extremity supported, No upper extremity supported Sitting balance-Leahy Scale: Good Sitting balance - Comments: EOB without support Standing balance support: Single extremity supported, During functional activity Standing balance-Leahy Scale: Poor Standing balance comment: RUE support and physical assist for balance    Special considerations/life events  Skin L tibial wound from fall   Previous Home Environment (from acute  therapy documentation) Living Arrangements: Spouse/significant other, Other relatives Available Help at Discharge: Family, Available 24 hours/day Type of Home: House Home Layout: One level Home Access: Stairs to enter Entrance Stairs-Rails: None Entrance Stairs-Number of Steps: 1 Bathroom Shower/Tub: Health Visitor: Standard Home Care Services: No  Discharge Living Setting Plans for Discharge Living Setting: Patient's home, Lives with (comment) (spouse) Type of Home at Discharge: House Discharge Home Layout: One level Discharge Home Access: Stairs to enter Entrance Stairs-Rails: None Entrance Stairs-Number of Steps: 1 small step Discharge Bathroom Shower/Tub: Walk-in shower Discharge Bathroom Toilet: Standard Discharge Bathroom Accessibility: Yes How Accessible: Accessible via walker Does the patient have any problems obtaining your medications?: No  Social/Family/Support Systems Patient Roles: Spouse Anticipated Caregiver: Lorton and 2 dtrs Anticipated Caregiver's Contact Information: [phone] Ability/Limitations of Caregiver: none stated Caregiver Availability: 24/7 Discharge Plan Discussed with Primary Caregiver: Yes Is Caregiver In Agreement with Plan?: Yes Does Caregiver/Family have Issues with Lodging/Transportation while Pt is in Rehab?: No  Goals Patient/Family Goal for Rehab: PT supervision to mod I, OT min assist to supervision, SLP n/a Expected length of stay: 10-14 days Additional Information: Discharge plan: home with spouse 24/7, also has assist from 2 daughters nearly 24/7 Pt/Family Agrees to Admission and willing to participate: Yes Program Orientation Provided & Reviewed with Pt/Caregiver Including Roles  & Responsibilities: Yes  Decrease burden of Care through IP rehab admission: n/a  Possible need for SNF placement upon discharge: Not anticipated.  Plan for discharge home with 24/7 support from spouse and 2 daughters  Patient  Condition: This patient's condition remains as documented in the consult dated 12/16, in which the Rehabilitation Physician determined and documented that the patient's condition is appropriate for intensive rehabilitative care in an inpatient rehabilitation facility. Will admit to inpatient rehab ***.  Preadmission Screen Completed By:  Reche FORBES Lowers, 12/24/2023 2:10 PM ______________________________________________________________________   Discussed status with Dr. PIERRETTE on *** at *** and received approval for admission today.  Admission Coordinator:  Kadarrius Yanke E Dally Oshel, PT, time PIERRETTEPattricia ***   Assessment/Plan: Diagnosis: *** Does the need for close, 24 hr/day Medical supervision in concert with the patient's rehab needs make it unreasonable for this patient to be served in a less intensive setting? {yes_no_potentially:3041433} Co-Morbidities requiring supervision/potential complications: *** Due to {due un:6958565}, does the patient require 24 hr/day rehab nursing? {yes_no_potentially:3041433} Does the patient require coordinated care of a physician, rehab nurse, PT, OT, and SLP to address physical and functional deficits in the context of the above medical diagnosis(es)? {yes_no_potentially:3041433} Addressing deficits in the following areas: {deficits:3041436} Can the patient actively participate in an intensive therapy program of  at least 3 hrs of therapy 5 days a week? {yes_no_potentially:3041433} The potential for patient to make measurable gains while on inpatient rehab is {potential:3041437} Anticipated functional outcomes upon discharge from inpatient rehab: {functional outcomes:304600100} PT, {functional outcomes:304600100} OT, {functional outcomes:304600100} SLP Estimated rehab length of stay to reach the above functional goals is: *** Anticipated discharge destination: {anticipated dc setting:21604} 10. Overall Rehab/Functional Prognosis: {potential:3041437}   MD  Signature: ***    [1]  Current Facility-Administered Medications:    acetaminophen  (TYLENOL ) tablet 1,000 mg, 1,000 mg, Oral, Q6H, Ann Fine, MD, 1,000 mg at 12/24/23 9370   bisacodyl  (DULCOLAX) suppository 10 mg, 10 mg, Rectal, Daily PRN, Eletha Boas, MD   docusate sodium  (COLACE) capsule 100 mg, 100 mg, Oral, BID, Ann Fine, MD, 100 mg at 12/24/23 9076   enoxaparin  (LOVENOX ) injection 30 mg, 30 mg, Subcutaneous, Q12H, Ann Fine, MD, 30 mg at 12/24/23 9076   gabapentin  (NEURONTIN ) capsule 300 mg, 300 mg, Oral, TID, Ann Fine, MD, 300 mg at 12/24/23 9076   guaiFENesin  (MUCINEX ) 12 hr tablet 600 mg, 600 mg, Oral, BID, Maczis, Michael M, PA-C, 600 mg at 12/24/23 1206   hydrALAZINE  (APRESOLINE ) injection 10 mg, 10 mg, Intravenous, Q2H PRN, Ann Fine, MD   HYDROmorphone  (DILAUDID ) injection 0.5 mg, 0.5 mg, Intravenous, Q4H PRN, Maczis, Michael M, PA-C   ibuprofen  (ADVIL ) tablet 600 mg, 600 mg, Oral, TID, Simaan, Elizabeth S, PA-C, 600 mg at 12/24/23 9076   lidocaine  (LIDODERM ) 5 % 2 patch, 2 patch, Transdermal, Q24H, Vicci Burnard SAUNDERS, PA-C, 2 patch at 12/24/23 9074   methocarbamol  (ROBAXIN ) tablet 750 mg, 750 mg, Oral, QID, 750 mg at 12/24/23 0923 **OR** [DISCONTINUED] methocarbamol  (ROBAXIN ) injection 500 mg, 500 mg, Intravenous, Q8H, Ann Fine, MD, 500 mg at 12/18/23 2145   ondansetron  (ZOFRAN -ODT) disintegrating tablet 4 mg, 4 mg, Oral, Q6H PRN, 4 mg at 12/24/23 0752 **OR** ondansetron  (ZOFRAN ) injection 4 mg, 4 mg, Intravenous, Q6H PRN, Paola Dreama SAILOR, MD   Oral care mouth rinse, 15 mL, Mouth Rinse, PRN, Tanda Locus, MD   oxyCODONE  (Oxy IR/ROXICODONE ) immediate release tablet 5-7.5 mg, 5-7.5 mg, Oral, Q4H PRN, Paola Dreama SAILOR, MD, 5 mg at 12/24/23 0749   polyethylene glycol (MIRALAX  / GLYCOLAX ) packet 17 g, 17 g, Oral, Daily, Paola Dreama SAILOR, MD, 17 g at 12/24/23 9075   promethazine  (PHENERGAN ) tablet 25 mg, 25 mg, Oral, Q6H PRN **OR** promethazine   (PHENERGAN ) 12.5 mg in sodium chloride  0.9 % 50 mL IVPB, 12.5 mg, Intravenous, Q6H PRN, Last Rate: 150 mL/hr at 12/23/23 0536, 12.5 mg at 12/23/23 0536 **OR** promethazine  (PHENERGAN ) suppository 25 mg, 25 mg, Rectal, Q6H PRN, Paola Dreama SAILOR, MD

## 2023-12-24 NOTE — Progress Notes (Signed)
 Occupational Therapy Treatment Patient Details Name: Margaret English MRN: [id number] DOB: 16-Oct-1945 Today's Date: 12/24/2023   History of present illness 78 y.o. F adm 12/18/23 after a fall with Lt PTX, Lt 3-7 rib fx, Lt clavicle fx, T12 compression fx (chronic), multiple Lt pelvic fxs and Rt inferior pubic rami fxs, Lt tibial plateau fx. PMHx: breast CA s/p mastectomy, cataract, CKD, osteoporosis.   OT comments  Pt in recliner and agreeable to OT session.  Educated on ROM to elbow distal on L UE only, importance of wearing sling (esp during mobility), she voiced understanding.  Pt requires total assist to don sling, overall max assist for UB dressing simulated; total assist for LB dressing this date.  Worked on standing from recliner, mod assist +2 to power up and cueing for technique/sequencing- pt initially attempting to push through L LE and requires support to maintain precautions when standing. Worked on scooting back in chair once sitting with mod assist.  Pts VSS on 1L via [place].  Continue to recommend >3hrs/day inpatient setting at dc. Will follow.       If plan is discharge home, recommend the following:  Two people to help with walking and/or transfers;Two people to help with bathing/dressing/bathroom;Assistance with cooking/housework;Help with stairs or ramp for entrance;Assist for transportation   Equipment Recommendations  Other (comment) (defer)    Recommendations for Other Services Rehab consult    Precautions / Restrictions Precautions Precautions: Fall Recall of Precautions/Restrictions: Intact Required Braces or Orthoses: Sling;Knee Immobilizer - Left Knee Immobilizer - Left: On at all times Restrictions Weight Bearing Restrictions Per Provider Order: Yes LUE Weight Bearing Per Provider Order: Non weight bearing LLE Weight Bearing Per Provider Order: Touchdown weight bearing Other Position/Activity Restrictions: per ortho note 12/12 okay for elbow distal ROM L UE        Mobility Bed Mobility               General bed mobility comments: OOB in recliner upon entry    Transfers Overall transfer level: Needs assistance Equipment used: 1 person hand held assist Transfers: Sit to/from Stand Sit to Stand: Mod assist, +2 safety/equipment           General transfer comment: pt sitting in recliner, mod assist to fully power up into standing. pt with cueing for technique and sequencing, adherence to TWB L LE.     Balance Overall balance assessment: Needs assistance Sitting-balance support: Feet supported, Single extremity supported, No upper extremity supported Sitting balance-Leahy Scale: Good Sitting balance - Comments: EOB without support   Standing balance support: Single extremity supported, During functional activity Standing balance-Leahy Scale: Poor Standing balance comment: RUE support and physical assist for balance                           ADL either performed or assessed with clinical judgement   ADL Overall ADL's : Needs assistance/impaired     Grooming: Minimal assistance;Sitting           Upper Body Dressing : Maximal assistance;Sitting   Lower Body Dressing: Total assistance;Sit to/from stand               Functional mobility during ADLs: Moderate assistance;+2 for physical assistance;Cueing for sequencing;Cueing for safety      Extremity/Trunk Assessment Upper Extremity Assessment Upper Extremity Assessment: LUE deficits/detail LUE Deficits / Details: WFL elbow distal ROM, educated on no shoulder ROM. LUE Coordination: decreased gross motor   Lower Extremity Assessment  Lower Extremity Assessment: Defer to PT evaluation        Vision       Perception     Praxis     Communication Communication Communication: Impaired Factors Affecting Communication: Hearing impaired (deaf Rt ear)   Cognition Arousal: Alert Behavior During Therapy: WFL for tasks assessed/performed Cognition: No  apparent impairments                               Following commands: Intact        Cueing   Cueing Techniques: Verbal cues, Gestural cues  Exercises      Shoulder Instructions       General Comments pt on 1L [place] during session, SpO2 stable    Pertinent Vitals/ Pain       Pain Assessment Pain Assessment: 0-10 Pain Score: 6  Pain Location: ribs wtih coughing, pelvis with mobility Pain Descriptors / Indicators: Aching, Grimacing, Guarding, Sore Pain Intervention(s): Limited activity within patient's tolerance, Monitored during session, Repositioned  Home Living                                          Prior Functioning/Environment              Frequency  Min 2X/week        Progress Toward Goals  OT Goals(current goals can now be found in the care plan section)  Progress towards OT goals: Progressing toward goals  Acute Rehab OT Goals Patient Stated Goal: move better and get home OT Goal Formulation: With patient/family Time For Goal Achievement: 01/04/24 Potential to Achieve Goals: Good  Plan      Co-evaluation                 AM-PAC OT 6 Clicks Daily Activity     Outcome Measure   Help from another person eating meals?: A Little Help from another person taking care of personal grooming?: A Little Help from another person toileting, which includes using toliet, bedpan, or urinal?: Total Help from another person bathing (including washing, rinsing, drying)?: A Lot Help from another person to put on and taking off regular upper body clothing?: A Lot Help from another person to put on and taking off regular lower body clothing?: Total 6 Click Score: 12    End of Session Equipment Utilized During Treatment: Gait belt;Oxygen;Left knee immobilizer (1L)  OT Visit Diagnosis: Other abnormalities of gait and mobility (R26.89);Muscle weakness (generalized) (M62.81);Pain Pain - part of body:  (ribs, L LE)   Activity  Tolerance Patient tolerated treatment well   Patient Left with call bell/phone within reach;with family/visitor present;in chair;with chair alarm set   Nurse Communication Mobility status;Precautions        Time: 8868-8795 OT Time Calculation (min): 33 min  Charges: OT General Charges $OT Visit: 1 Visit OT Treatments $Self Care/Home Management : 23-37 mins  Margaret English, OT Acute Rehabilitation Services Office [phone] Secure Chat Preferred    Margaret English 12/24/2023, 1:24 PM

## 2023-12-24 NOTE — Progress Notes (Signed)
 Subjective: Patient reports pain over her left ribs with coughing. Productive cough with brown/green sputum. No sob. Pulling 1000 on IS. On 2L. Does not use o2 at home. Denies tobacco use.   Some nausea with pain medications but does not want to change these as her pain is well controlled and feels these are working. No abdominal pain or vomiting. Tolerating po. Passing flatus. BM 2 days ago. Voiding.   Up to the chair this morning.   Afebrile. No tachycardia or hypotension.  No labs done today.   Objective: Vital signs in last 24 hours: Temp:  [98.7 F (37.1 C)-99.2 F (37.3 C)] 99.2 F (37.3 C) (12/16 0748) Pulse Rate:  [79-95] 95 (12/16 0748) Resp:  [19] 19 (12/16 0748) BP: (108-148)/(65-83) 148/83 (12/16 0748) SpO2:  [90 %-95 %] 95 % (12/16 0911) Last BM Date : 12/22/23  Intake/Output from previous day: 12/15 0701 - 12/16 0700 In: 240 [P.O.:240] Out: 1275 [Urine:1275] Intake/Output this shift: No intake/output data recorded.  PE: Gen: NAD, lying in bed HEENT: PERRL Heart: regular, radial 2+ b/l. LLE cap refill 2+, R DP 2+ Lungs: CTA, respiratory effort nonlabored, pain in her left chest as expected Abd: soft, NT Ext: MAE's, KI and ace on LLE.  Moves her toes with normal sensation Psych: A&Ox3  Lab Results:  Recent Labs    12/22/23 0510 12/23/23 0546  WBC 5.6 5.8  HGB 10.5* 10.2*  HCT 32.3* 30.3*  PLT 129* 156   BMET Recent Labs    12/22/23 0510 12/23/23 0546  NA 140 139  K 4.1 4.1  CL 104 106  CO2 27 27  GLUCOSE 104* 104*  BUN 17 16  CREATININE 0.64 0.39*  CALCIUM  8.5* 8.3*   PT/INR No results for input(s): LABPROT, INR in the last 72 hours. CMP     Component Value Date/Time   NA 139 12/23/2023 0546   NA 147 (H) 12/18/2016 1125   NA 143 12/14/2015 0932   K 4.1 12/23/2023 0546   K 4.2 12/18/2016 1125   K 4.3 12/14/2015 0932   CL 106 12/23/2023 0546   CL 103 12/18/2016 1125   CO2 27 12/23/2023 0546   CO2 30 12/18/2016 1125    CO2 27 12/14/2015 0932   GLUCOSE 104 (H) 12/23/2023 0546   GLUCOSE 101 12/18/2016 1125   BUN 16 12/23/2023 0546   BUN 11 12/18/2016 1125   BUN 10.2 12/14/2015 0932   CREATININE 0.39 (L) 12/23/2023 0546   CREATININE 0.71 12/04/2023 1033   CREATININE 0.9 12/18/2016 1125   CREATININE 0.8 12/14/2015 0932   CALCIUM  8.3 (L) 12/23/2023 0546   CALCIUM  10.3 12/18/2016 1125   CALCIUM  10.1 12/14/2015 0932   PROT 6.8 12/04/2023 1033   PROT 6.9 12/18/2016 1125   PROT 6.8 12/14/2015 0932   ALBUMIN 4.3 12/04/2023 1033   ALBUMIN 3.8 12/18/2016 1125   ALBUMIN 3.7 12/14/2015 0932   AST 22 12/04/2023 1033   AST 32 12/14/2015 0932   ALT 16 12/04/2023 1033   ALT 32 12/18/2016 1125   ALT 35 12/14/2015 0932   ALKPHOS 93 12/04/2023 1033   ALKPHOS 73 12/18/2016 1125   ALKPHOS 75 12/14/2015 0932   BILITOT 0.7 12/04/2023 1033   BILITOT 0.82 12/14/2015 0932   GFRNONAA >60 12/23/2023 0546   GFRNONAA >60 12/04/2023 1033   GFRAA >60 07/09/2019 1144   Lipase  No results found for: LIPASE     Studies/Results: DG Abd 1 View Result Date: 12/22/2023  CLINICAL DATA:  [id number] Abdominal pain [id number] EXAM: ABDOMEN - 1 VIEW COMPARISON:  July 31, 2016 FINDINGS: Air and stool-filled nondilated loops of bowel. Mild-to-moderate colonic stool burden diffusely throughout the colon. Bibasilar reticulonodular opacities, likely atelectasis. Pelvic phleboliths. Vascular calcifications. Degenerative changes of the lumbar spine. Asymmetric sclerosis of the LEFT sacrum likely reflecting a nondisplaced sacral fracture. Revisualization of a LEFT superior pubic ramus fracture. IMPRESSION: 1. Nonobstructive bowel gas pattern. 2. Asymmetric sclerosis of the LEFT sacrum likely reflecting a nondisplaced sacral fracture. Revisualization of a LEFT superior pubic ramus fracture. Electronically Signed   By: Corean Salter M.D.   On: 12/22/2023 19:34    Anti-infectives: Anti-infectives (From admission, onward)    None         Assessment/Plan Mechanical fall (garage steps) Left 3-7 rib fxs with trace PTX - multimodal pain control, pulm toilet, IS, add mucinex  for cough, wean o2 Left clavicle fx - sling, non-op mgmt per Dr. Kendal, NWB Chronic T12 compression fx - pt reports this is from 3-4 years ago Left pubic rami fxs with extension into acetabulum - Per Ortho, Dr. Kendal, TDWB LLE Left pubic bone fx - Per Ortho, Dr. Kendal, TDWB LLE Right inferior pubic ramus fx - per ortho Dr. Kendal Left tibial plateau fx with moderate lipohemarthrosis - Per Ortho, Dr. Kendal, TDWB LLE in KI  Hx of breast CA s/p bilateral mastectomy and chemo  Chemo related neuropathy  Osteoporosis FEN: reg diet, bowel regimen VTE: LMWH ID: no current indication for abx Foley: None, spont void Dispo: PT/OT - CIR. Has 24/7 support from family (husband and daughter) after rehab.   I reviewed nursing notes, Consultant ortho notes, last 24 h vitals and pain scores, last 48 h intake and output, last 24 h labs and trends, and last 24 h imaging results.   LOS: 6 days    Margaret English , [facility] 12/24/2023, 10:49 AM Please see Amion for pager number during day hours 7:00am-4:30pm or 7:00am -11:30am on weekends

## 2023-12-24 NOTE — TOC Progression Note (Signed)
 Transition of Care [facility]) - Progression Note    Patient Details  Name: Margaret English MRN: [id number] Date of Birth: 1945/06/19  Transition of Care [facility]) CM/SW Contact  Jisel Fleet E Luiscarlos Kaczmarczyk, LCSW Phone Number: 12/24/2023, 11:55 AM  Clinical Narrative:    CSW met with patient, spouse Ed, and daughter Jon at bedside. They state they just met with the CIR MD and are very hopeful for CIR. They are agreeable to SNF work up as a back up plan. Provided Medicare Care Compare info to research options if needed.   Expected Discharge Plan: IP Rehab Facility Barriers to Discharge: Continued Medical Work up               Expected Discharge Plan and Services   Discharge Planning Services: CM Consult                                           Social Drivers of Health (SDOH) Interventions SDOH Screenings   Food Insecurity: No Food Insecurity (12/19/2023)  Housing: Unknown (12/19/2023)  Transportation Needs: No Transportation Needs (12/19/2023)  Utilities: Not At Risk (12/19/2023)  Depression (PHQ2-9): Low Risk (12/04/2023)  Social Connections: Moderately Integrated (12/19/2023)  Tobacco Use: Low Risk (12/18/2023)    Readmission Risk Interventions     No data to display

## 2023-12-24 NOTE — Consult Note (Signed)
 Physical Medicine and Rehabilitation Consult Reason for Consult: Polytrauma Referring Physician: Trauma service   HPI: Margaret English is a 78 y.o. female with a history of breast cancer status postmastectomy, chronic kidney disease and osteoporosis who fell from her garage steps on 12/18/2023 suffering left pneumothorax, fractures of left ribs 3 through 7, left clavicle fracture,  multiple left-sided pelvic fractures and right inferior pubic rami fractures, left tibial plateau fracture.  She has a chronic T12 compression from 3 to 4 years ago.  Fracture patient was placed in sling for left level fracture.  Plan is for nonoperative management of her pelvic fractures.  Patient is toe-touch weightbearing left lower extremity.  She is in a knee immobilizer for the tibial plateau fracture with toe down weightbearing on the left side.  She has been working with therapy and is min assist for sit to stand transfers and pivoted while standing to the chair.  She has not attempted gait as of yet.  Patient was modified independent working and driving prior to this admission.  It appears that she has some assistance from her spouse and others after discharge    Home: Home Living Family/patient expects to be discharged to:: Private residence Living Arrangements: Spouse/significant other, Other relatives Available Help at Discharge: Family, Available 24 hours/day Type of Home: House Home Access: Stairs to enter Secretary/administrator of Steps: 1 Entrance Stairs-Rails: None Home Layout: One level Bathroom Shower/Tub: Health Visitor: Standard Home Equipment: Information systems manager - built in  Functional History: Prior Function Prior Level of Function : Independent/Modified Independent, Working/employed, 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 Status:  Mobility: Bed Mobility Overal bed mobility: Needs Assistance Bed Mobility: Supine to Sit Supine to sit: Min assist,  HOB elevated, Used rails Sit to supine: Mod assist General bed mobility comments: min assist to move LLE to EOB and elevate trunk with use of rail and HOB 35 degrees, increased time, min assist to scoot to EOB Transfers Overall transfer level: Needs assistance Equipment used: 1 person hand held assist Transfers: Sit to/from Stand, Bed to chair/wheelchair/BSC Sit to Stand: Min assist Bed to/from chair/wheelchair/BSC transfer type:: Stand pivot Stand pivot transfers: Mod assist  Lateral/Scoot Transfers:  (initiated lateral scoots, 3 attempts with lateral movement of ~3 inches total to R side.) General transfer comment: min assist to rise from bed and chair with cues for RUE placement. mod assist stand pivot toward Rt with pt holding onto therapist with RUE and tech guarding LLE on pillow with pivot, +2 assist for lines/safety Ambulation/Gait General Gait Details: not yet able    ADL: ADL Overall ADL's : Needs assistance/impaired Eating/Feeding: Set up, Sitting Grooming: Moderate assistance, Sitting Upper Body Bathing: Moderate assistance, Sitting Lower Body Bathing: Total assistance, Bed level Upper Body Dressing : Total assistance, Sitting Lower Body Dressing: Total assistance, Bed level  Cognition: Cognition Orientation Level: Oriented X4 Cognition Arousal: Alert Behavior During Therapy: WFL for tasks assessed/performed   Review of Systems  Constitutional:  Negative for fever.  HENT: Negative.    Eyes: Negative.   Respiratory: Negative.    Cardiovascular: Negative.   Gastrointestinal: Negative.   Musculoskeletal:  Positive for joint pain and myalgias.  Skin: Negative.   Neurological:  Positive for weakness. Negative for dizziness, sensory change and speech change.  Psychiatric/Behavioral:  Negative for depression.    Past Medical History:  Diagnosis Date   Allergy    Cancer [facility]) 2009   Breast    Cataract  Chronic kidney disease    kidney stones   History of  kidney stones    Neuromuscular disorder (HCC)    neuropathy in feet due to chemo   Osteopenia    Osteoporosis    Past Surgical History:  Procedure Laterality Date   ABDOMINAL HYSTERECTOMY     COLONOSCOPY  03/16/2016   Dr.Perry   EYE SURGERY Bilateral    2016- Cataracts   MASTECTOMY Bilateral    2009   POLYPECTOMY     Family History  Problem Relation Age of Onset   Colon cancer Neg Hx    Esophageal cancer Neg Hx    Rectal cancer Neg Hx    Stomach cancer Neg Hx    Social History:  reports that she has never smoked. She has never used smokeless tobacco. She reports current alcohol use. She reports that she does not use drugs. Allergies: Allergies[1] Medications Prior to Admission  Medication Sig Dispense Refill   acetaminophen  (TYLENOL ) 500 MG tablet Take 500 mg by mouth every 6 (six) hours as needed.     acyclovir (ZOVIRAX) 200 MG capsule Take 200-600 mg by mouth See admin instructions. 400 mg in the morning and 600 in the evening if she has a break out     ALPRAZolam  (XANAX ) 0.5 MG tablet Take 0.5 mg by mouth daily as needed. Uses when flying or occasional sleep.     aspirin 81 MG EC tablet Take 162 mg by mouth at bedtime. Swallow whole.     Calcium -Vitamin D -Vitamin K (CALCIUM  SOFT CHEWS PO) Take 750 mg by mouth daily as needed (bone).     celecoxib (CELEBREX) 200 MG capsule Take 200 mg by mouth as needed.     Cholecalciferol (VITAMIN D3) 50 MCG (2000 UT) CAPS Take by mouth 2 (two) times daily.     Elderberry 500 MG CAPS Take 1 capsule by mouth daily.     fluticasone (FLONASE) 50 MCG/ACT nasal spray Place 2 sprays into both nostrils as needed for allergies or rhinitis.     Magnesium  250 MG TABS Take 1 tablet by mouth 3 (three) times a week.     OVER THE COUNTER MEDICATION Take 1 tablet by mouth daily. WEEM supplement       Blood pressure (!) 148/83, pulse 95, temperature 99.2 F (37.3 C), temperature source Oral, resp. rate 19, height 5' 6 (1.676 m), weight 67.5 kg, SpO2  95%. Physical Exam Constitutional:      Appearance: She is obese.  HENT:     Head: Normocephalic.     Right Ear: External ear normal.     Left Ear: External ear normal.     Nose: Nose normal.     Mouth/Throat:     Pharynx: Oropharynx is clear.  Eyes:     Conjunctiva/sclera: Conjunctivae normal.  Cardiovascular:     Rate and Rhythm: Normal rate.  Pulmonary:     Effort: Pulmonary effort is normal.  Abdominal:     Palpations: Abdomen is soft.  Musculoskeletal:        General: Swelling and tenderness present.     Cervical back: Normal range of motion.     Comments: Left leg in KI, swelling throughout the leg. Left shoulder limited d/t clavicle pain  Skin:    General: Skin is warm.  Neurological:     Mental Status: She is alert.     Comments: Alert and oriented x 3. Normal insight and awareness. Intact Memory. Normal language and speech. Cranial nerve exam unremarkable.  MMT: RUE 4/5. LUE 2/5 (pain), 4/5 WF/EF/HI. RLE 3-4/5 prox to 4+/5 ADFPF, LLE limited by ortho. Can wiggle toes. Sensory exam normal for light touch and pain in all 4 limbs. No limb ataxia or cerebellar signs. No abnormal tone appreciated.  SABRA    Psychiatric:        Mood and Affect: Mood normal.        Behavior: Behavior normal.     No results found for this or any previous visit (from the past 24 hours). DG Abd 1 View Result Date: 12/22/2023 CLINICAL DATA:  [id number] Abdominal pain [id number] EXAM: ABDOMEN - 1 VIEW COMPARISON:  July 31, 2016 FINDINGS: Air and stool-filled nondilated loops of bowel. Mild-to-moderate colonic stool burden diffusely throughout the colon. Bibasilar reticulonodular opacities, likely atelectasis. Pelvic phleboliths. Vascular calcifications. Degenerative changes of the lumbar spine. Asymmetric sclerosis of the LEFT sacrum likely reflecting a nondisplaced sacral fracture. Revisualization of a LEFT superior pubic ramus fracture. IMPRESSION: 1. Nonobstructive bowel gas pattern. 2. Asymmetric sclerosis  of the LEFT sacrum likely reflecting a nondisplaced sacral fracture. Revisualization of a LEFT superior pubic ramus fracture. Electronically Signed   By: Corean Salter M.D.   On: 12/22/2023 19:34    Assessment/Plan: Diagnosis: 78 year old female who fell suffering multiple injuries as outlined above Does the need for close, 24 hr/day medical supervision in concert with the patient's rehab needs make it unreasonable for this patient to be served in a less intensive setting? Yes Co-Morbidities requiring supervision/potential complications:  - Posttraumatic pain management - Posttraumatic anemia -Wound care considerations, Ortho weightbearing precautions Due to bladder management, bowel management, safety, skin/wound care, disease management, medication administration, and patient education, does the patient require 24 hr/day rehab nursing? Yes Does the patient require coordinated care of a physician, rehab nurse, therapy disciplines of PT, OT to address physical and functional deficits in the context of the above medical diagnosis(es)? Yes Addressing deficits in the following areas: balance, endurance, locomotion, strength, transferring, bowel/bladder control, bathing, dressing, feeding, grooming, toileting, and psychosocial support Can the patient actively participate in an intensive therapy program of at least 3 hrs of therapy per day at least 5 days per week? Yes The potential for patient to make measurable gains while on inpatient rehab is excellent Anticipated functional outcomes upon discharge from inpatient rehab are supervision to mod I with PT, supervision and min assist with OT, n/a with SLP. Estimated rehab length of stay to reach the above functional goals is: 10-14 days Anticipated discharge destination: Home Overall Rehab/Functional Prognosis: excellent  POST ACUTE RECOMMENDATIONS: This patient's condition is appropriate for continued rehabilitative care in the following  setting: CIR Patient has agreed to participate in recommended program. Yes Note that insurance prior authorization may be required for reimbursement for recommended care.  Comment: Pt is very motivated, active. Husband can provide assist at home. Rehab Admissions Coordinator to follow up.       I have personally performed a face to face diagnostic evaluation of this patient. Additionally, I have examined the patient's medical record including any pertinent labs and radiographic images.    Thanks,  Arthea ONEIDA Gunther, MD 12/24/2023      [1]  Allergies Allergen Reactions   Sulfa Antibiotics Other (See Comments)    Drainage and red eyes only allergic to sulfa eye drops   Sulfonamide Derivatives Other (See Comments)    Eye drops only   Latex Itching and Dermatitis    Latex bandages   [facility]vs Astringent Eye Drops [Tetrahydrozoline-Zn Sulfate] Other (  See Comments)

## 2023-12-24 NOTE — Care Management Important Message (Signed)
 Important Message  Patient Details  Name: Margaret English MRN: [id number] Date of Birth: Oct 26, 1945   Important Message Given:  Yes - Medicare IM     Jennie Laneta Dragon 12/24/2023, 12:49 PM

## 2023-12-24 NOTE — Progress Notes (Signed)
 Inpatient Rehab Coordinator Note:  I met with patient and her family (spouse and daughter) at bedside to discuss CIR recommendations and goals/expectations of CIR stay.  We reviewed 3 hrs/day of therapy, physician follow up, and average length of stay 2 weeks (dependent upon progress) with goals of supervision with PT, and up to min assist with OT.  Pt/family confirm plan to discharge home with spouse and 2 daughters providing 24/7 support.  WE reviewed insurance auth process and pt confirms [facility] Medicare as primary payor.  I will work on starting auth request today.   Reche Lowers, PT, DPT Admissions Coordinator [phone] 12/24/2023 2:05 PM

## 2023-12-24 NOTE — Progress Notes (Signed)
 Physical Therapy Treatment Patient Details Name: Margaret English MRN: [id number] DOB: 22-Sep-1945 Today's Date: 12/24/2023   History of Present Illness 78 y.o. F adm 12/18/23 after a fall with Lt PTX, Lt 3-7 rib fx, Lt clavicle fx, T12 compression fx (chronic), multiple Lt pelvic fxs and Rt inferior pubic rami fxs, Lt tibial plateau fx. PMHx: breast CA s/p mastectomy, cataract, CKD, osteoporosis.    PT Comments  Pt pleasant and wanting to get OOB. Pt without pain at rest, limited left chest and pelvic pain with mobility. Pt with desaturation to 87% on RA and maintained >92% on 1.5L throughout session. Pt educated for transfers, progression and safety. Pt happily tearful end of session to be OOB to chair, daughter present and helpful with lines throughout session. Patient will benefit from intensive inpatient follow-up therapy, >3 hours/day     If plan is discharge home, recommend the following: Assistance with cooking/housework;Assist for transportation;Help with stairs or ramp for entrance;A lot of help with walking and/or transfers;A lot of help with bathing/dressing/bathroom   Can travel by private vehicle        Equipment Recommendations  Wheelchair (measurements PT);Wheelchair cushion (measurements PT);Hospital bed;BSC/3in1    Recommendations for Other Services       Precautions / Restrictions Precautions Precautions: Fall Recall of Precautions/Restrictions: Intact Required Braces or Orthoses: Sling;Knee Immobilizer - Left Knee Immobilizer - Left: On at all times Restrictions LUE Weight Bearing Per Provider Order: Non weight bearing LLE Weight Bearing Per Provider Order: Touchdown weight bearing     Mobility  Bed Mobility Overal bed mobility: Needs Assistance Bed Mobility: Supine to Sit     Supine to sit: Min assist, HOB elevated, Used rails     General bed mobility comments: min assist to move LLE to EOB and elevate trunk with use of rail and HOB 35 degrees, increased  time, min assist to scoot to EOB    Transfers Overall transfer level: Needs assistance   Transfers: Sit to/from Stand, Bed to chair/wheelchair/BSC Sit to Stand: Min assist Stand pivot transfers: Mod assist         General transfer comment: min assist to rise from bed and chair with cues for RUE placement. mod assist stand pivot toward Rt with pt holding onto therapist with RUE and tech guarding LLE on pillow with pivot, +2 assist for lines/safety    Ambulation/Gait               General Gait Details: not yet able   Stairs             Wheelchair Mobility     Tilt Bed    Modified Rankin (Stroke Patients Only)       Balance Overall balance assessment: Needs assistance Sitting-balance support: Feet supported, Single extremity supported, No upper extremity supported Sitting balance-Leahy Scale: Good Sitting balance - Comments: EOB without support   Standing balance support: Single extremity supported, During functional activity Standing balance-Leahy Scale: Poor Standing balance comment: RUE support and physical assist for balance                            Communication Communication Communication: Impaired Factors Affecting Communication: Hearing impaired (deaf Rt ear)  Cognition Arousal: Alert Behavior During Therapy: WFL for tasks assessed/performed   PT - Cognitive impairments: No apparent impairments                         Following  commands: Intact      Cueing Cueing Techniques: Verbal cues, Gestural cues  Exercises      General Comments        Pertinent Vitals/Pain Pain Assessment Pain Score: 4  Pain Location: ribs wtih coughing, pelvis with pivot to EOB Pain Descriptors / Indicators: Aching, Grimacing, Guarding, Sore Pain Intervention(s): Limited activity within patient's tolerance, Monitored during session, Premedicated before session, Repositioned    Home Living                          Prior  Function            PT Goals (current goals can now be found in the care plan section) Progress towards PT goals: Progressing toward goals    Frequency    Min 3X/week      PT Plan      Co-evaluation              AM-PAC PT 6 Clicks Mobility   Outcome Measure  Help needed turning from your back to your side while in a flat bed without using bedrails?: A Little Help needed moving from lying on your back to sitting on the side of a flat bed without using bedrails?: A Little Help needed moving to and from a bed to a chair (including a wheelchair)?: A Lot Help needed standing up from a chair using your arms (e.g., wheelchair or bedside chair)?: A Little Help needed to walk in hospital room?: Total Help needed climbing 3-5 steps with a railing? : Total 6 Click Score: 13    End of Session Equipment Utilized During Treatment: Left knee immobilizer;Gait belt;Oxygen Activity Tolerance: Patient tolerated treatment well Patient left: with call bell/phone within reach;with family/visitor present;in chair;with chair alarm set Nurse Communication: Mobility status;Precautions;Weight bearing status PT Visit Diagnosis: Other abnormalities of gait and mobility (R26.89);Muscle weakness (generalized) (M62.81);Pain     Time: 0737-0806 PT Time Calculation (min) (ACUTE ONLY): 29 min  Charges:    $Therapeutic Activity: 23-37 mins PT General Charges $$ ACUTE PT VISIT: 1 Visit                     Lenoard SQUIBB, PT Acute Rehabilitation Services Office: [phone]    Lenoard NOVAK Erryn Dickison 12/24/2023, 9:16 AM

## 2023-12-25 MED ORDER — MAGNESIUM CITRATE PO SOLN
0.5000 | Freq: Once | ORAL | Status: DC
  Filled 2023-12-25: qty 296

## 2023-12-25 MED ORDER — SENNA 8.6 MG PO TABS
2.0000 | ORAL_TABLET | Freq: Once | ORAL | Status: AC
  Administered 2023-12-25: 18:00:00 17.2 mg via ORAL
  Filled 2023-12-25: qty 2

## 2023-12-25 MED ORDER — BISACODYL 5 MG PO TBEC
10.0000 mg | DELAYED_RELEASE_TABLET | Freq: Once | ORAL | Status: DC
  Filled 2023-12-25: qty 2

## 2023-12-25 MED ORDER — POLYETHYLENE GLYCOL 3350 17 G PO PACK
17.0000 g | PACK | Freq: Two times a day (BID) | ORAL | Status: DC
  Administered 2024-01-02: 17 g via ORAL
  Filled 2023-12-25 (×8): qty 1

## 2023-12-25 MED ORDER — MAGNESIUM HYDROXIDE 400 MG/5ML PO SUSP: 30.0000 mL | Freq: Once | ORAL | Status: DC

## 2023-12-25 MED ORDER — BUSPIRONE HCL 10 MG PO TABS
10.0000 mg | ORAL_TABLET | Freq: Two times a day (BID) | ORAL | Status: DC
  Administered 2023-12-25 – 2024-01-03 (×18): 10 mg via ORAL
  Filled 2023-12-25 (×12): qty 1
  Filled 2023-12-25: qty 2
  Filled 2023-12-25 (×5): qty 1

## 2023-12-25 MED ORDER — HYDROXYZINE HCL 25 MG PO TABS
25.0000 mg | ORAL_TABLET | Freq: Three times a day (TID) | ORAL | Status: DC | PRN
  Administered 2023-12-27 – 2024-01-02 (×10): 25 mg via ORAL
  Filled 2023-12-25 (×11): qty 1

## 2023-12-25 NOTE — Progress Notes (Signed)
 Subjective: Patient reports pain over her left ribs with coughing. Continued productive cough but better after starting mucinex . No sob. Pulling 1250 on IS. Weaned to 1L.  Also some pain in left hip with movement. Pain is well controlled. Tolerating po without n/v. Passing flatus. No BM. Last BM 12/14. Voiding. Up to the chair yesterday.   Her daughter is at bedside.   Afebrile. Tachycardia resolved. No hypotension.  No labs done today.   Objective: Vital signs in last 24 hours: Temp:  [98.5 F (36.9 C)-99.1 F (37.3 C)] 98.8 F (37.1 C) (12/17 0800) Pulse Rate:  [84-105] 96 (12/17 0800) Resp:  [17-20] 17 (12/17 0800) BP: (121-130)/(67-71) 130/71 (12/17 0800) SpO2:  [92 %-94 %] 93 % (12/17 0800) Last BM Date : 12/22/23  Intake/Output from previous day: 12/16 0701 - 12/17 0700 In: -  Out: 700 [Urine:700] Intake/Output this shift: No intake/output data recorded.  PE: Gen: NAD, lying in bed Heart: regular, radial 2+ b/l. LLE cap refill 2+, R DP 2+ Lungs: CTA, respiratory effort nonlabored, on 1L, pulling 1250 on IS Abd: soft, ND, NT Ext: MAE's, KI and ace on LLE.  Moves her toes with normal sensation Psych: A&Ox3  Lab Results:  Recent Labs    12/23/23 0546  WBC 5.8  HGB 10.2*  HCT 30.3*  PLT 156   BMET Recent Labs    12/23/23 0546  NA 139  K 4.1  CL 106  CO2 27  GLUCOSE 104*  BUN 16  CREATININE 0.39*  CALCIUM  8.3*   PT/INR No results for input(s): LABPROT, INR in the last 72 hours. CMP     Component Value Date/Time   NA 139 12/23/2023 0546   NA 147 (H) 12/18/2016 1125   NA 143 12/14/2015 0932   K 4.1 12/23/2023 0546   K 4.2 12/18/2016 1125   K 4.3 12/14/2015 0932   CL 106 12/23/2023 0546   CL 103 12/18/2016 1125   CO2 27 12/23/2023 0546   CO2 30 12/18/2016 1125   CO2 27 12/14/2015 0932   GLUCOSE 104 (H) 12/23/2023 0546   GLUCOSE 101 12/18/2016 1125   BUN 16 12/23/2023 0546   BUN 11 12/18/2016 1125   BUN 10.2 12/14/2015 0932    CREATININE 0.39 (L) 12/23/2023 0546   CREATININE 0.71 12/04/2023 1033   CREATININE 0.9 12/18/2016 1125   CREATININE 0.8 12/14/2015 0932   CALCIUM  8.3 (L) 12/23/2023 0546   CALCIUM  10.3 12/18/2016 1125   CALCIUM  10.1 12/14/2015 0932   PROT 6.8 12/04/2023 1033   PROT 6.9 12/18/2016 1125   PROT 6.8 12/14/2015 0932   ALBUMIN 4.3 12/04/2023 1033   ALBUMIN 3.8 12/18/2016 1125   ALBUMIN 3.7 12/14/2015 0932   AST 22 12/04/2023 1033   AST 32 12/14/2015 0932   ALT 16 12/04/2023 1033   ALT 32 12/18/2016 1125   ALT 35 12/14/2015 0932   ALKPHOS 93 12/04/2023 1033   ALKPHOS 73 12/18/2016 1125   ALKPHOS 75 12/14/2015 0932   BILITOT 0.7 12/04/2023 1033   BILITOT 0.82 12/14/2015 0932   GFRNONAA >60 12/23/2023 0546   GFRNONAA >60 12/04/2023 1033   GFRAA >60 07/09/2019 1144   Lipase  No results found for: LIPASE     Studies/Results: No results found.   Anti-infectives: Anti-infectives (From admission, onward)    None        Assessment/Plan Mechanical fall (garage steps) Left 3-7 rib fxs with trace PTX - multimodal pain control, pulm toilet, IS,  mucinex  for cough, wean o2 Left clavicle fx - sling, non-op mgmt per Dr. Kendal, NWB Chronic T12 compression fx - pt reports this is from 3-4 years ago Left pubic rami fxs with extension into acetabulum - Per Ortho, Dr. Kendal, TDWB LLE Left pubic bone fx - Per Ortho, Dr. Kendal, TDWB LLE Right inferior pubic ramus fx - per ortho Dr. Kendal Left tibial plateau fx with moderate lipohemarthrosis - Per Ortho, Dr. Kendal, TDWB LLE in KI  Hx of breast CA s/p bilateral mastectomy and chemo  Chemo related neuropathy  Osteoporosis FEN: reg diet, increase bowel regimen VTE: SCD, LMWH ID: no current indication for abx. Resp cx not acceptable for testing - afebrile, wbc wnl on last check, no sob, hold on sending repeat sample for now Foley: None, spont void Dispo: PT/OT - CIR. Auth pending.   I reviewed nursing notes, Consultant ortho  notes, last 24 h vitals and pain scores, last 48 h intake and output, last 24 h labs and trends, and last 24 h imaging results.   LOS: 7 days    Margaret English , [facility] 12/25/2023, 9:47 AM Please see Amion for pager number during day hours 7:00am-4:30pm or 7:00am -11:30am on weekends

## 2023-12-25 NOTE — Progress Notes (Signed)
 Physical Therapy Treatment Patient Details Name: Margaret English MRN: [id number] DOB: [birth year] Today's Date: 12/25/2023   History of Present Illness 78 y.o. F adm 12/18/23 after a fall with Lt PTX, Lt 3-7 rib fx, Lt clavicle fx, T12 compression fx (chronic), multiple Lt pelvic fxs and Rt inferior pubic rami fxs, Lt tibial plateau fx. PMHx: breast CA s/p mastectomy, cataract, CKD, osteoporosis.    PT Comments  Pt received in chair, alert and agreeable to therapy session, daughter present and encouraging pt. Pt reporting discomfort today due to constipation and recently receiving suppository, so she defers wheelchair mobility in hallway as PTA had planned, but agreeable to work on standing and pivot transfer training. Pt SpO2 desat to 88-90% on 1L O2 [place] with exertion and transfer to more reclined posture once in bed, so wall O2 increased to 1.5L/min. Pt instructed on IS frequency, pressure relief strategy/frequency in bed vs chair, benefits of mobility and plan for next session to work more on wheelchair mobility in the hallway, pt agreeable to this plan. Patient will benefit from intensive inpatient follow-up therapy, >3 hours/day, she is making good progress toward her goals and reports she tolerated up to chair for ~5 hours today, including transfers to/from [facility].     If plan is discharge home, recommend the following: Assistance with cooking/housework;Assist for transportation;Help with stairs or ramp for entrance;A lot of help with walking and/or transfers;A lot of help with bathing/dressing/bathroom   Can travel by private vehicle        Equipment Recommendations  Wheelchair (measurements PT);Wheelchair cushion (measurements PT);Hospital bed;BSC/3in1 (removable arm and leg rests, elevating leg rests)    Recommendations for Other Services Rehab consult     Precautions / Restrictions Precautions Precautions: Fall Recall of Precautions/Restrictions: Intact Required Braces or Orthoses:  Sling;Knee Immobilizer - Left Knee Immobilizer - Left: On at all times Restrictions Weight Bearing Restrictions Per Provider Order: Yes LUE Weight Bearing Per Provider Order: Non weight bearing LLE Weight Bearing Per Provider Order: Touchdown weight bearing Other Position/Activity Restrictions: per ortho note 12/12 okay for elbow distal ROM L UE     Mobility  Bed Mobility Overal bed mobility: Needs Assistance Bed Mobility: Sit to Supine       Sit to supine: Min assist, +2 for safety/equipment   General bed mobility comments: to flat bed, no rail use; minA for LLE assist over EOB    Transfers Overall transfer level: Needs assistance Equipment used: 1 person hand held assist Transfers: Sit to/from Stand, Bed to chair/wheelchair/BSC Sit to Stand: Mod assist, +2 safety/equipment Stand pivot transfers: Mod assist, +2 safety/equipment         General transfer comment: pt sitting in recliner, mod assist to fully power up into standing. pt with cueing for technique and sequencing, adherence to TWB L LE, tech present to assist wtih IV/O2 lines for safety.    Ambulation/Gait               General Gait Details: not yet able   Psychologist, Counselling mobility:  (had planned to use but due to pt just receiving suppository and requesting return to supine, PTA defer this session. Needs elevating leg rest for LLE on WC next session.)   Tilt Bed    Modified Rankin (Stroke Patients Only)       Balance Overall balance assessment: Needs assistance Sitting-balance support: Feet supported, Single extremity supported, No upper extremity  supported Sitting balance-Leahy Scale: Good Sitting balance - Comments: EOB without support   Standing balance support: Single extremity supported, During functional activity Standing balance-Leahy Scale: Poor Standing balance comment: RUE support and physical assist for balance                             Communication Communication Communication: Impaired Factors Affecting Communication: Hearing impaired (deaf R ear)  Cognition Arousal: Alert Behavior During Therapy: WFL for tasks assessed/performed   PT - Cognitive impairments: No apparent impairments                       PT - Cognition Comments: Pt able to verbalize precs on LUE and able to don/doff sling with reminders Following commands: Intact      Cueing Cueing Techniques: Verbal cues, Gestural cues  Exercises Other Exercises Other Exercises: reviewed IS use, pt reports compliance, encouraged BLE ankle pumps x10 hourly along with IS hourly. Pt able to achieve ~1,000 mL on IS    General Comments General comments (skin integrity, edema, etc.): SpO2 desat to 88% on 1L O2 [place] wtih exertion and remains 90-91% on 1L in supine, so pt O2 increased to 1.5L on wall to maintain SpO2 >92%. HOB ~30 deg in supine. HR ~115 bpm with exertion.      Pertinent Vitals/Pain Pain Assessment Pain Assessment: 0-10 Pain Score: 2  (resting) Pain Location: ribs with coughing, pelvis with mobility, 2/10 resting in bed Pain Descriptors / Indicators: Aching, Grimacing, Guarding, Sore Pain Intervention(s): Limited activity within patient's tolerance, Monitored during session, Premedicated before session, Repositioned    Home Living                          Prior Function            PT Goals (current goals can now be found in the care plan section) Acute Rehab PT Goals Patient Stated Goal: to return to prior level of function, improve transfer quality PT Goal Formulation: With patient/family Time For Goal Achievement: 01/03/24 Progress towards PT goals: Progressing toward goals    Frequency    Min 3X/week      PT Plan      Co-evaluation              AM-PAC PT 6 Clicks Mobility   Outcome Measure  Help needed turning from your back to your side while in a flat bed without using  bedrails?: A Little Help needed moving from lying on your back to sitting on the side of a flat bed without using bedrails?: A Little Help needed moving to and from a bed to a chair (including a wheelchair)?: A Lot Help needed standing up from a chair using your arms (e.g., wheelchair or bedside chair)?: A Lot Help needed to walk in hospital room?: Total Help needed climbing 3-5 steps with a railing? : Total 6 Click Score: 12    End of Session Equipment Utilized During Treatment: Gait belt;Left knee immobilizer;Oxygen Activity Tolerance: Patient tolerated treatment well;Other (comment) (c/o constipation related discomfort) Patient left: in bed;with call bell/phone within reach;with bed alarm set;with family/visitor present;Other (comment) (L heel floated in bed, KI in place, daughter Jon present; pt defers SCD on RLE in bed at this time as it makes her feel claustrophobic) Nurse Communication: Mobility status;Precautions;Other (comment) (O2 increased to 1.5L due to desat; constipation, RN aware) PT Visit Diagnosis: Other  abnormalities of gait and mobility (R26.89);Muscle weakness (generalized) (M62.81);Pain Pain - Right/Left: Left Pain - part of body: Shoulder (flank/ribs)     Time: 8384-8361 PT Time Calculation (min) (ACUTE ONLY): 23 min  Charges:    $Therapeutic Activity: 23-37 mins PT General Charges $$ ACUTE PT VISIT: 1 Visit                     Kailiana Granquist P., PTA Acute Rehabilitation Services Secure Chat Preferred 9a-5:30pm Office: [phone]    Connell HERO [facility] 12/25/2023, 4:55 PM

## 2023-12-25 NOTE — Progress Notes (Signed)
 Inpatient Rehab Admissions Coordinator:   Awaiting determination from [facility] Medicare for CIR prior auth request.  Will follow.   Reche Lowers, PT, DPT Admissions Coordinator [phone] 12/25/2023 10:45 AM

## 2023-12-25 NOTE — Plan of Care (Signed)

## 2023-12-26 NOTE — TOC Progression Note (Signed)
 Transition of Care [facility]) - Progression Note    Patient Details  Name: Margaret English MRN: [id number] Date of Birth: 11-Dec-1945  Transition of Care [facility]) CM/SW Contact  Carmelita FORBES Carbon, LCSW Phone Number: 12/26/2023, 9:06 AM  Clinical Narrative:    Awaiting CIR insurance authorization.   Expected Discharge Plan: IP Rehab Facility Barriers to Discharge: Continued Medical Work up               Expected Discharge Plan and Services   Discharge Planning Services: CM Consult                                           Social Drivers of Health (SDOH) Interventions SDOH Screenings   Food Insecurity: No Food Insecurity (12/19/2023)  Housing: Unknown (12/19/2023)  Transportation Needs: No Transportation Needs (12/19/2023)  Utilities: Not At Risk (12/19/2023)  Depression (PHQ2-9): Low Risk (12/04/2023)  Social Connections: Moderately Integrated (12/19/2023)  Tobacco Use: Low Risk (12/18/2023)    Readmission Risk Interventions     No data to display

## 2023-12-26 NOTE — Plan of Care (Signed)

## 2023-12-26 NOTE — Progress Notes (Signed)
 Physical Therapy Treatment Patient Details Name: Margaret English MRN: [id number] DOB: 1945-01-28 Today's Date: 12/26/2023   History of Present Illness 78 y.o. F adm 12/18/23 after a fall with Lt PTX, Lt 3-7 rib fx, Lt clavicle fx, T12 compression fx (chronic), multiple Lt pelvic fxs and Rt inferior pubic rami fxs, Lt tibial plateau fx. PMHx: breast CA s/p mastectomy, cataract, CKD, osteoporosis.    PT Comments  Edris remains pleasant, eager to mobilize and happy with progress. Pt able to transition to EOB with decreased assist, pivot to surfaces and stand on RLE with assist for pericare. Initiated WC mobility and education with pt Rt side fatiguing after pt chosen distance to get down the hall to large windows. Pt and daughter educated for sequence and transfers with pt on RA at 89-92% throughout session. Will continue to follow. Patient will benefit from intensive inpatient follow-up therapy, >3 hours/day     If plan is discharge home, recommend the following: Assistance with cooking/housework;Assist for transportation;Help with stairs or ramp for entrance;A lot of help with walking and/or transfers;A lot of help with bathing/dressing/bathroom   Can travel by private vehicle        Equipment Recommendations  Wheelchair (measurements PT);Wheelchair cushion (measurements PT);BSC/3in1    Recommendations for Other Services       Precautions / Restrictions Precautions Precautions: Fall Recall of Precautions/Restrictions: Intact Required Braces or Orthoses: Sling;Knee Immobilizer - Left Knee Immobilizer - Left: On at all times Restrictions LUE Weight Bearing Per Provider Order: Non weight bearing LLE Weight Bearing Per Provider Order: Touchdown weight bearing Other Position/Activity Restrictions: per ortho note 12/12 okay for elbow distal ROM L UE     Mobility  Bed Mobility Overal bed mobility: Needs Assistance Bed Mobility: Supine to Sit     Supine to sit: Min assist, HOB elevated,  Used rails     General bed mobility comments: HOB 30 degrees with rail and min assist only to move LLE, pt able to elevate trunk with rail and scoot to EOB    Transfers Overall transfer level: Needs assistance   Transfers: Sit to/from Stand, Bed to chair/wheelchair/BSC Sit to Stand: Min assist Stand pivot transfers: Mod assist         General transfer comment: min assist to rise from bed and BSC, mod assist to pivot bed>BSC>WC with cues for sequence, hand placement and safety. Pt holding therapist in static standing for pericare    Ambulation/Gait                   Psychologist, Counselling mobility: Yes Wheelchair propulsion: Right upper extremity, Right lower extremity Wheelchair parts: Supervision/cueing Distance: 200 Wheelchair Assistance Details (indicate cue type and reason): assist for Left brake, leg rests and therapist holding up LLE as leg rest would not elevate. cues for steering with pt alternating between RUE and RLE use. assist to direct RW in close quarters with return to room   Tilt Bed    Modified Rankin (Stroke Patients Only)       Balance Overall balance assessment: Needs assistance Sitting-balance support: Feet supported, Single extremity supported, No upper extremity supported Sitting balance-Leahy Scale: Good Sitting balance - Comments: EOB without support   Standing balance support: Single extremity supported, During functional activity Standing balance-Leahy Scale: Poor Standing balance comment: RUE support and physical assist for balance  Communication Communication Communication: Impaired Factors Affecting Communication: Hearing impaired  Cognition Arousal: Alert Behavior During Therapy: WFL for tasks assessed/performed   PT - Cognitive impairments: No apparent impairments                         Following commands: Intact       Cueing Cueing Techniques: Verbal cues, Gestural cues  Exercises      General Comments General comments (skin integrity, edema, etc.): SpO2 on RA 90-94%, after transfer to recliner desaturates briefly to 87% but recovers with PLB      Pertinent Vitals/Pain Pain Assessment Pain Score: 2  Pain Location: pelvis Pain Descriptors / Indicators: Aching, Sore Pain Intervention(s): Limited activity within patient's tolerance, Monitored during session, Premedicated before session, Repositioned    Home Living                          Prior Function            PT Goals (current goals can now be found in the care plan section) Progress towards PT goals: Progressing toward goals    Frequency    Min 3X/week      PT Plan      Co-evaluation              AM-PAC PT 6 Clicks Mobility   Outcome Measure  Help needed turning from your back to your side while in a flat bed without using bedrails?: A Little Help needed moving from lying on your back to sitting on the side of a flat bed without using bedrails?: A Little Help needed moving to and from a bed to a chair (including a wheelchair)?: A Lot Help needed standing up from a chair using your arms (e.g., wheelchair or bedside chair)?: A Little Help needed to walk in hospital room?: Total Help needed climbing 3-5 steps with a railing? : Total 6 Click Score: 13    End of Session Equipment Utilized During Treatment: Gait belt;Left knee immobilizer Activity Tolerance: Patient tolerated treatment well Patient left: in chair;with family/visitor present;Other (comment) (In WC with OT to tranfers back to recliner with daughter and spouse present) Nurse Communication: Precautions;Mobility status PT Visit Diagnosis: Other abnormalities of gait and mobility (R26.89);Muscle weakness (generalized) (M62.81);Pain     Time: 0932-1009 PT Time Calculation (min) (ACUTE ONLY): 37 min  Charges:    $Therapeutic Activity:  23-37 mins PT General Charges $$ ACUTE PT VISIT: 1 Visit                     Lenoard SQUIBB, PT Acute Rehabilitation Services Office: [phone]    Lenoard NOVAK Annalyse Langlais 12/26/2023, 11:48 AM

## 2023-12-26 NOTE — Progress Notes (Addendum)
 Subjective: Seen with RN. Daughter at bedside.   She reports pain over her left ribs with coughing that is improved. Productive cough better after starting mucinex . No sob. Pulling 1250 on IS. Weaned to 0.5L while in the room.  Also some pain in pelvis and LLE with movement. Pain is well controlled. Not requiring prn pain medications. Tolerating po without n/v. Eating more. BM after suppository yesterday. Voiding. Worked with therapies yesterday.   Afebrile. Tachycardia resolved. Soft BP overnight improved. HDS without fever, tachycardia or hypotension on last check.  No labs done today.   Objective: Vital signs in last 24 hours: Temp:  [98 F (36.7 C)-98.9 F (37.2 C)] 98.4 F (36.9 C) (12/18 0808) Pulse Rate:  [76-102] 89 (12/18 0808) Resp:  [17-20] 18 (12/18 0808) BP: (97-137)/(55-70) 137/66 (12/18 0808) SpO2:  [90 %-96 %] 94 % (12/18 0808) Last BM Date : 12/25/23  Intake/Output from previous day: 12/17 0701 - 12/18 0700 In: 360 [P.O.:360] Out: 400 [Urine:400] Intake/Output this shift: No intake/output data recorded.  PE: Gen: NAD, lying in bed Heart: regular Lungs: CTA, respiratory effort nonlabored, on 0.5L, pulling 1250 on IS Abd: soft, ND, NT Ext: MAE's, KI and ace on LLE.  Moves her toes with normal sensation. L radial 2+ LLE cap refill 2+, R DP 2+ Psych: A&Ox3  Lab Results:  No results for input(s): WBC, HGB, HCT, PLT in the last 72 hours.  BMET No results for input(s): NA, K, CL, CO2, GLUCOSE, BUN, CREATININE, CALCIUM  in the last 72 hours.  PT/INR No results for input(s): LABPROT, INR in the last 72 hours. CMP     Component Value Date/Time   NA 139 12/23/2023 0546   NA 147 (H) 12/18/2016 1125   NA 143 12/14/2015 0932   K 4.1 12/23/2023 0546   K 4.2 12/18/2016 1125   K 4.3 12/14/2015 0932   CL 106 12/23/2023 0546   CL 103 12/18/2016 1125   CO2 27 12/23/2023 0546   CO2 30 12/18/2016 1125   CO2 27 12/14/2015 0932    GLUCOSE 104 (H) 12/23/2023 0546   GLUCOSE 101 12/18/2016 1125   BUN 16 12/23/2023 0546   BUN 11 12/18/2016 1125   BUN 10.2 12/14/2015 0932   CREATININE 0.39 (L) 12/23/2023 0546   CREATININE 0.71 12/04/2023 1033   CREATININE 0.9 12/18/2016 1125   CREATININE 0.8 12/14/2015 0932   CALCIUM  8.3 (L) 12/23/2023 0546   CALCIUM  10.3 12/18/2016 1125   CALCIUM  10.1 12/14/2015 0932   PROT 6.8 12/04/2023 1033   PROT 6.9 12/18/2016 1125   PROT 6.8 12/14/2015 0932   ALBUMIN 4.3 12/04/2023 1033   ALBUMIN 3.8 12/18/2016 1125   ALBUMIN 3.7 12/14/2015 0932   AST 22 12/04/2023 1033   AST 32 12/14/2015 0932   ALT 16 12/04/2023 1033   ALT 32 12/18/2016 1125   ALT 35 12/14/2015 0932   ALKPHOS 93 12/04/2023 1033   ALKPHOS 73 12/18/2016 1125   ALKPHOS 75 12/14/2015 0932   BILITOT 0.7 12/04/2023 1033   BILITOT 0.82 12/14/2015 0932   GFRNONAA >60 12/23/2023 0546   GFRNONAA >60 12/04/2023 1033   GFRAA >60 07/09/2019 1144   Lipase  No results found for: LIPASE     Studies/Results: No results found.   Anti-infectives: Anti-infectives (From admission, onward)    None        Assessment/Plan Mechanical fall (garage steps) Left 3-7 rib fxs with trace PTX - multimodal pain control, pulm toilet, IS, mucinex   for cough, wean o2 Left clavicle fx - sling, non-op mgmt per Dr. Kendal, NWB Chronic T12 compression fx - pt reports this is from 3-4 years ago Left pubic rami fxs with extension into acetabulum - Per Ortho, Dr. Kendal, TDWB LLE Left pubic bone fx - Per Ortho, Dr. Kendal, TDWB LLE Right inferior pubic ramus fx - per ortho Dr. Kendal Left tibial plateau fx with moderate lipohemarthrosis - Per Ortho, Dr. Kendal, TDWB LLE in KI  Hx of breast CA s/p bilateral mastectomy and chemo  Chemo related neuropathy  Osteoporosis Acute stress reaction/anxiety - psych consult FEN: reg diet, continue bowel regimen VTE: SCD, LMWH ID: no current indication for abx. Resp cx not acceptable for testing  - afebrile, wbc wnl on last check, no sob, hold on sending repeat sample for now Foley: None, spont void Dispo: PT/OT - CIR. Auth pending.   I reviewed nursing notes, Consultant ortho notes, last 24 h vitals and pain scores, last 48 h intake and output, last 24 h labs and trends, and last 24 h imaging results.   LOS: 8 days    Margaret English , [facility] 12/26/2023, 8:19 AM Please see Amion for pager number during day hours 7:00am-4:30pm or 7:00am -11:30am on weekends

## 2023-12-26 NOTE — Progress Notes (Signed)
 Occupational Therapy Treatment Patient Details Name: RODOLFO NOTARO MRN: [id number] DOB: 22-Apr-1945 Today's Date: 12/26/2023   History of present illness 78 y.o. F adm 12/18/23 after a fall with Lt PTX, Lt 3-7 rib fx, Lt clavicle fx, T12 compression fx (chronic), multiple Lt pelvic fxs and Rt inferior pubic rami fxs, Lt tibial plateau fx. PMHx: breast CA s/p mastectomy, cataract, CKD, osteoporosis.   OT comments  Pt in wheelchair, handoff from PT.  Worked on stand pivot transfer towards R side, cueing for techniques and safety- pt needing min assist to power up and steady; pivoting with min assist given increased time.  Initiated education with compensatory LB dressing techniques, she will benefit from use of LB AE, but needing overall total assist +2 (daughter assisting) to pull up underwear.  SpO2 stable on RA, 1 brief drop to 87% but recovered quickly with PLB.  Continue to recommend >3hrs/day inpatient setting at dc. Will follow acutely.       If plan is discharge home, recommend the following:  Assistance with cooking/housework;Help with stairs or ramp for entrance;Assist for transportation;A lot of help with walking and/or transfers;A lot of help with bathing/dressing/bathroom   Equipment Recommendations  Other (comment) (defer)    Recommendations for Other Services Rehab consult    Precautions / Restrictions Precautions Precautions: Fall Recall of Precautions/Restrictions: Intact Required Braces or Orthoses: Sling;Knee Immobilizer - Left Knee Immobilizer - Left: On at all times Restrictions Weight Bearing Restrictions Per Provider Order: Yes LUE Weight Bearing Per Provider Order: Non weight bearing LLE Weight Bearing Per Provider Order: Touchdown weight bearing Other Position/Activity Restrictions: per ortho note 12/12 okay for elbow distal ROM L UE       Mobility Bed Mobility                    Transfers Overall transfer level: Needs assistance Equipment used: 1  person hand held assist Transfers: Sit to/from Stand Sit to Stand: Min assist, +2 safety/equipment           General transfer comment: pt sitting in wheelchair, min assist to power up with cueing for R hand placement and technique. good adherance to precautions to L LE.     Balance Overall balance assessment: Needs assistance Sitting-balance support: Feet supported, Single extremity supported, No upper extremity supported Sitting balance-Leahy Scale: Good     Standing balance support: Single extremity supported, During functional activity Standing balance-Leahy Scale: Poor Standing balance comment: RUE support and physical assist for balance                           ADL either performed or assessed with clinical judgement   ADL Overall ADL's : Needs assistance/impaired                     Lower Body Dressing: Total assistance;+2 for physical assistance;Sit to/from stand Lower Body Dressing Details (indicate cue type and reason): assist to don mesh underwear, therapist assists to stand while daughter pulls over hips Toilet Transfer: Minimal assistance Toilet Transfer Details (indicate cue type and reason): stimulated from wheelchair to recliner towards R side         Functional mobility during ADLs: Minimal assistance;Cueing for safety;Cueing for sequencing General ADL Comments: cueing for sequencing and techniques.  initated educated on LB compensatory techniques.    Extremity/Trunk Assessment              Vision  Perception     Praxis     Communication Communication Communication: Impaired Factors Affecting Communication: Hearing impaired (deaf R ear)   Cognition Arousal: Alert Behavior During Therapy: WFL for tasks assessed/performed Cognition: No apparent impairments                               Following commands: Intact        Cueing   Cueing Techniques: Verbal cues, Gestural cues  Exercises       Shoulder Instructions       General Comments SpO2 on RA 90-94%, after transfer to recliner desaturates briefly to 87% but recovers with PLB    Pertinent Vitals/ Pain       Pain Assessment Pain Assessment: Faces Faces Pain Scale: Hurts little more Pain Location: pelvis, R hip with transfers Pain Descriptors / Indicators: Aching, Grimacing, Guarding, Sore Pain Intervention(s): Limited activity within patient's tolerance, Monitored during session, Repositioned  Home Living                                          Prior Functioning/Environment              Frequency  Min 2X/week        Progress Toward Goals  OT Goals(current goals can now be found in the care plan section)  Progress towards OT goals: Progressing toward goals  Acute Rehab OT Goals Patient Stated Goal: move better and get home, rehab OT Goal Formulation: With patient/family Time For Goal Achievement: 01/04/24 Potential to Achieve Goals: Good  Plan      Co-evaluation                 AM-PAC OT 6 Clicks Daily Activity     Outcome Measure   Help from another person eating meals?: A Little Help from another person taking care of personal grooming?: A Little Help from another person toileting, which includes using toliet, bedpan, or urinal?: Total Help from another person bathing (including washing, rinsing, drying)?: A Lot Help from another person to put on and taking off regular upper body clothing?: A Lot Help from another person to put on and taking off regular lower body clothing?: Total 6 Click Score: 12    End of Session Equipment Utilized During Treatment: Gait belt;Left knee immobilizer;Other (comment) (sling)  OT Visit Diagnosis: Other abnormalities of gait and mobility (R26.89);Muscle weakness (generalized) (M62.81);Pain Pain - part of body:  (pelvis, L LE)   Activity Tolerance Patient tolerated treatment well   Patient Left with call bell/phone within  reach;with family/visitor present;in chair   Nurse Communication Mobility status;Precautions        Time: 8994-8972 OT Time Calculation (min): 22 min  Charges: OT General Charges $OT Visit: 1 Visit OT Treatments $Self Care/Home Management : 8-22 mins  Etta NOVAK, OT Acute Rehabilitation Services Office [phone] Secure Chat Preferred    Etta GORMAN Hope 12/26/2023, 10:48 AM

## 2023-12-26 NOTE — Consult Note (Signed)
 [facility] Psychiatric Consult Initial  Patient Name: Margaret English  MRN: [id number]  DOB: July 03, 1945  Consult Order details:  Orders (From admission, onward)     Start     Ordered   12/25/23 1602  IP CONSULT TO PSYCHIATRY       Ordering Provider: Paola Dreama LOISE, MD  Provider:  (Not yet assigned)  Question Answer Comment  Location [facility]   Reason for Consult? acute stress reaction/anxiety      12/25/23 1602            Mode of Visit: In person   Psychiatry Consult Evaluation  Service Date: December 26, 2023 LOS:  LOS: 8 days  Chief Complaint acute stress reaction/anxiety  Primary Psychiatric Diagnoses  Anxiety  Assessment  Margaret English is a 78 y.o. female admitted:medically on 12/18/2023  1:34 PM for a mechanical fall after tripping on a pair of shoes and landing on her left side on concrete. She has no relevant psychiatric diagnosis and has a past medical history of breast cancer in remission, osteoporosis, CKD.  On assessment, patient reports mild anxiety regarding how she will transition to life again given temporary functional limitations, including having to use a wheelchair.  She had some tearful episodes earlier this relating related to accident, but reports she is feeling well currently. She denies symptoms of low mood, avoidance, hypervigilance, nightmares. While she does endorse occasional flashbacks, she reports these are not bothersome. Overall, patient's mood appears to be stable. She presents as very future-oriented and optimistic. She has a extensive and involved support system.  Discussed with patient and family that if she were to experience worsening symptoms of anxiety or low mood following discharge, recommend following up with outpatient mental health provider or PCP. Psychiatry will sign off. Please reach out if further questions or concerns arise.  Please see plan below for detailed recommendations.   Diagnoses:  [facility]  problems: Principal Problem:   Trauma   Plan   ## Psychiatric Medication Recommendations:  -- Continue hydroxyzine  25 mg as needed for anxiety -- Continue Buspar  10 mg BID for anxiety  ## Medical Decision Making Capacity: Not specifically addressed in this encounter  ## Further Work-up:  -- Per primary team -- Pertinent labwork reviewed earlier this admission includes: BMP, CBC, UA  ## Disposition:-- There are no psychiatric contraindications to discharge at this time.  ## Behavioral / Environmental: - No specific recommendations at this time.    ## Safety and Observation Level:  - Based on my clinical evaluation, I estimate the patient to be at low risk of self harm in the current setting. - At this time, we recommend routine observation. This decision is based on my review of the chart including patient's history and current presentation, interview of the patient, mental status examination, and consideration of suicide risk including evaluating suicidal ideation, plan, intent, suicidal or self-harm behaviors, risk factors, and protective factors. This judgment is based on our ability to directly address suicide risk, implement suicide prevention strategies, and develop a safety plan while the patient is in the clinical setting. Please contact our team if there is a concern that risk level has changed.  CSSR Risk Category:C-SSRS RISK CATEGORY: No Risk  Suicide Risk Assessment: Patient has following modifiable risk factors for suicide: pain, medical illness (ie new dx of cancer), which we are addressing by medication management. Patient has following non-modifiable or demographic risk factors for suicide: None Patient has the following protective factors  against suicide: Access to outpatient mental health care, Supportive family, Supportive friends, no history of suicide attempts, and no history of NSSIB  Thank you for this consult request. Recommendations have been communicated to the  primary team.  We will sign off at this time.   Ashley LOISE Gravely, MD      History of Present Illness  Relevant Aspects of Hospital Course:  Admitted on 12/18/2023 for mechanical fall after tripping on shoes. Patient reports she fell down one step and landed on her left side on concrete. She was found to have trace left pneumothorax, left clavicle fracture, acetabular fracture, left-sided pubic rami fractures, multiple rib fractures and left tibial plateau fracture.  Patient Report:   On initial evaluation, patient seen in her room with daughter and husband at bedside. Patient reports she feels pretty good today. States she tripped over some shoes at home and fell onto concrete, and fractured several bones. States she had a busy morning working with PT and was able to get in a wheelchair and wheel herself down the hallway. States she knows recovery will be a challenge, but is happy with the progress she's made so far. States since being hospitalized a week ago, she's had about two days where she was tearful about the accident, but says she's feeling better. Endorses some mild anxiety about going home and how she is going to navigate her home and life with a wheelchair. Also has some anxiety around falling again. Reports she has a tremendous support network of family and friends. Daughter states patient is very independent and vibrant at baseline, and states patient just returned from a cruise with her husband two weeks ago. Patient states she is looking forward to making progress in rehab and getting back to walking, traveling, and being independent again. She's had a couple flashbacks of falling, but states these are not bothersome. Reports she has good dreams of being able to walk again. Denies symptoms of hypervigilance, nightmares, avoidance, or low mood. She denies any prior mental health history.  Psych ROS:  Depression: Denies Anxiety: Denies Mania (lifetime and current): Denies Psychosis:  (lifetime and current): Denies  Review of Systems  Gastrointestinal:  Negative for abdominal pain, constipation, diarrhea, nausea and vomiting.  Neurological:  Negative for dizziness and headaches.  All other systems reviewed and are negative.    Psychiatric and Social History  Psychiatric History:  Information collected from patient and chart review.  Prev Dx/Sx: None Current Psych Provider: Denies Home Meds (current): Denies Previous Med Trials: Denies Therapy: Denies  Prior Psych Hospitalization: Denies  Prior Self Harm: Denies Prior Violence: Denies  Family Psych History: Denies Family Hx suicide: Denies  Social History:  Patient lives with her husband and granddaughter. She has a daughter. She has many supportive friends and family locally. She works as a firefighter.  Access to weapons/lethal means: Denies  Substance History Patient denies history of substance use.  Exam Findings  Vital Signs:  Temp:  [98 F (36.7 C)-98.9 F (37.2 C)] 98.4 F (36.9 C) (12/18 0808) Pulse Rate:  [76-102] 89 (12/18 0808) Resp:  [17-20] 18 (12/18 0808) BP: (97-137)/(55-70) 137/66 (12/18 0808) SpO2:  [90 %-96 %] 94 % (12/18 0808) Blood pressure 137/66, pulse 89, temperature 98.4 F (36.9 C), temperature source Oral, resp. rate 18, height 5' 6 (1.676 m), weight 67.5 kg, SpO2 94%. Body mass index is 24.02 kg/m.  Physical Exam Vitals and nursing note reviewed.  Constitutional:      General: She  is not in acute distress.    Appearance: Normal appearance. She is normal weight. She is not ill-appearing.  HENT:     Head: Normocephalic and atraumatic.  Pulmonary:     Effort: Pulmonary effort is normal. No respiratory distress.  Neurological:     General: No focal deficit present.     Mental Status: She is alert and oriented to person, place, and time. Mental status is at baseline.    Mental Status Exam: General Appearance: Casual and Well Groomed  Orientation:  Full (Time,  Place, and Person)  Memory:  Immediate;   Good Recent;   Good Remote;   Good  Concentration:  Concentration: Good and Attention Span: Good  Recall:  Good  Attention  Good  Eye Contact:  Good  Speech:  Clear and Coherent and Normal Rate  Language:  Good  Volume:  Normal  Mood: Pretty good  Affect:  Appropriate, Congruent, and Full Range  Thought Process:  Coherent and Linear  Thought Content:  WDL  Suicidal Thoughts:  No  Homicidal Thoughts:  No  Judgement:  Good  Insight:  Good  Psychomotor Activity:  Normal  Akathisia:  No  Fund of Knowledge:  Good   Assets:  Communication Skills Housing Intimacy Leisure Time Resilience Social Support Vocational/Educational  Cognition:  WNL  ADL's:  Impaired  AIMS (if indicated):    Other History   These have been pulled in through the EMR, reviewed, and updated if appropriate.   Family History:  The patient's family history is not on file.  Medical History: 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   Osteopenia    Osteoporosis    Surgical History: Past Surgical History:  Procedure Laterality Date   ABDOMINAL HYSTERECTOMY     COLONOSCOPY  03/16/2016   Dr.Perry   EYE SURGERY Bilateral    2016- Cataracts   MASTECTOMY Bilateral    2009   POLYPECTOMY     Medications:  Current Medications[1]  Allergies: Allergies[2]  Ashley LOISE Gravely, MD    [1]  Current Facility-Administered Medications:    acetaminophen  (TYLENOL ) tablet 1,000 mg, 1,000 mg, Oral, Q6H, Ann Fine, MD, 1,000 mg at 12/26/23 9396   bisacodyl  (DULCOLAX) suppository 10 mg, 10 mg, Rectal, Daily PRN, Eletha Boas, MD, 10 mg at 12/25/23 1554   busPIRone  (BUSPAR ) tablet 10 mg, 10 mg, Oral, BID, Paola Dreama LOISE, MD, 10 mg at 12/26/23 9081   docusate sodium   (COLACE) capsule 100 mg, 100 mg, Oral, BID, Ann Fine, MD, 100 mg at 12/25/23 9078   enoxaparin  (LOVENOX ) injection 30 mg, 30 mg, Subcutaneous, Q12H, Ann Fine, MD, 30 mg at 12/26/23 9082   feeding supplement (ENSURE PLUS HIGH PROTEIN) liquid 237 mL, 237 mL, Oral, BID BM, Vicci Burnard SAUNDERS, PA-C, 237 mL at 12/26/23 9142   gabapentin  (NEURONTIN ) capsule 300 mg, 300 mg, Oral, TID, Ann Fine, MD, 300 mg at 12/26/23 9080   guaiFENesin  (MUCINEX ) 12 hr tablet 600 mg, 600 mg, Oral, BID, Maczis, Michael M, PA-C, 600 mg at 12/26/23 9081   hydrALAZINE  (APRESOLINE ) injection 10 mg, 10 mg, Intravenous, Q2H PRN, Ann Fine, MD   HYDROmorphone  (DILAUDID ) injection 0.5 mg, 0.5 mg, Intravenous, Q4H PRN, Maczis, Michael M, PA-C  hydrOXYzine  (ATARAX ) tablet 25 mg, 25 mg, Oral, TID PRN, Paola Dreama SAILOR, MD   ibuprofen  (ADVIL ) tablet 600 mg, 600 mg, Oral, TID, Simaan, Elizabeth S, PA-C, 600 mg at 12/26/23 9081   lidocaine  (LIDODERM ) 5 % 2 patch, 2 patch, Transdermal, Q24H, Vicci Burnard SAUNDERS, PA-C, 2 patch at 12/26/23 9080   methocarbamol  (ROBAXIN ) tablet 750 mg, 750 mg, Oral, QID, 750 mg at 12/26/23 0918 **OR** [DISCONTINUED] methocarbamol  (ROBAXIN ) injection 500 mg, 500 mg, Intravenous, Q8H, Ann Fine, MD, 500 mg at 12/18/23 2145   ondansetron  (ZOFRAN -ODT) disintegrating tablet 4 mg, 4 mg, Oral, Q6H PRN, 4 mg at 12/24/23 0752 **OR** ondansetron  (ZOFRAN ) injection 4 mg, 4 mg, Intravenous, Q6H PRN, Paola Dreama SAILOR, MD   Oral care mouth rinse, 15 mL, Mouth Rinse, PRN, Tanda Locus, MD   oxyCODONE  (Oxy IR/ROXICODONE ) immediate release tablet 5-7.5 mg, 5-7.5 mg, Oral, Q4H PRN, Paola Dreama SAILOR, MD, 5 mg at 12/26/23 0919   polyethylene glycol (MIRALAX  / GLYCOLAX ) packet 17 g, 17 g, Oral, BID, Maczis, Michael M, PA-C   promethazine  (PHENERGAN ) tablet 25 mg, 25 mg, Oral, Q6H PRN **OR** promethazine  (PHENERGAN ) 12.5 mg in sodium chloride  0.9 % 50 mL IVPB, 12.5 mg, Intravenous, Q6H PRN, Last  Rate: 150 mL/hr at 12/23/23 0536, 12.5 mg at 12/23/23 0536 **OR** promethazine  (PHENERGAN ) suppository 25 mg, 25 mg, Rectal, Q6H PRN, Paola Dreama SAILOR, MD [2]  Allergies Allergen Reactions   Sulfa Antibiotics Other (See Comments)    Drainage and red eyes only allergic to sulfa eye drops   Sulfonamide Derivatives Other (See Comments)    Eye drops only   Latex Itching and Dermatitis    Latex bandages   [facility] Astringent Eye Drops [Tetrahydrozoline-Zn Sulfate] Other (See Comments)

## 2023-12-26 NOTE — Progress Notes (Signed)
 Inpatient Rehab Admissions Coordinator:   Awaiting determination from [facility] Medicare for CIR prior auth request.  Will follow.   Reche Lowers, PT, DPT Admissions Coordinator [phone] 12/26/2023 10:21 AM

## 2023-12-26 NOTE — Plan of Care (Signed)
  Problem: Education: Goal: Knowledge of General Education information will improve Description: Including pain rating scale, medication(s)/side effects and non-pharmacologic comfort measures Outcome: Progressing   Problem: Clinical Measurements: Goal: Ability to maintain clinical measurements within normal limits will improve Outcome: Progressing   Problem: Activity: Goal: Risk for activity intolerance will decrease Outcome: Progressing   Problem: Pain Managment: Goal: General experience of comfort will improve and/or be controlled Outcome: Progressing   Problem: Safety: Goal: Ability to remain free from injury will improve Outcome: Progressing   Problem: Skin Integrity: Goal: Risk for impaired skin integrity will decrease Outcome: Progressing

## 2023-12-27 MED ORDER — CHOLECALCIFEROL 10 MCG/ML (400 UNIT/ML) PO LIQD
4000.0000 [IU] | Freq: Every day | ORAL | Status: DC
  Administered 2023-12-27 – 2023-12-28 (×2): 4000 [IU] via ORAL
  Filled 2023-12-27 (×3): qty 10

## 2023-12-27 NOTE — TOC Progression Note (Signed)
 Transition of Care [facility]) - Progression Note    Patient Details  Name: Margaret English MRN: [id number] Date of Birth: Jul 03, 1945  Transition of Care [facility]) CM/SW Contact  Moniqua Engebretsen M, RN Phone Number: 12/27/2023, 5:17 PM  Clinical Narrative:    Appeal pending for CIR; bed offers given to patient and family.  They will review and choose facility as a backup plan.    Expected Discharge Plan: IP Rehab Facility Barriers to Discharge: Continued Medical Work up               Expected Discharge Plan and Services   Discharge Planning Services: CM Consult                                           Social Drivers of Health (SDOH) Interventions SDOH Screenings   Food Insecurity: No Food Insecurity (12/19/2023)  Housing: Unknown (12/19/2023)  Transportation Needs: No Transportation Needs (12/19/2023)  Utilities: Not At Risk (12/19/2023)  Depression (PHQ2-9): Low Risk (12/04/2023)  Social Connections: Moderately Integrated (12/19/2023)  Tobacco Use: Low Risk (12/18/2023)    Readmission Risk Interventions     No data to display         Mliss MICAEL Fass, RN, BSN  Trauma/Neuro ICU Case Manager [phone]

## 2023-12-27 NOTE — Plan of Care (Signed)

## 2023-12-27 NOTE — H&P (Shared)
 "   Physical Medicine and Rehabilitation Admission H&P    Chief Complaint  Patient presents with   Fall  : HPI: Margaret English is a 78 year old right-handed female with history significant for chronic kidney disease, osteoporosis, breast cancer status postmastectomy 2009 followed by Dr. Timmy, anxiety, chronic T12 compression fracture from 3 to 4 years ago.  Per chart review patient lives with spouse.  1 level home one-step to entry.  Independent and working as a firefighter.  Presented 12/18/2023 after mechanical fall when she tripped over a pair of shoes down her garage steps.  Denied loss of consciousness.  Cranial CT scan negative.  CT cervical spine no acute osseous abnormality.  Noted incidental 1.7 cm hypodense right thyroid nodule recommend nonemergent thyroid ultrasound as outpatient.  CT of the chest abdomen and pelvis showed multiple nondisplaced/minimally displaced left anterior lateral rib fractures with the tiniest medial pneumothorax, lateral left clavicle fracture, left superior and inferior pubic rami and left pubic bone fractures with associated hematoma in the medial soft tissues.  Left superior pubic ramus fracture appeared to involve the anterior left acetabulum.  Probable nondisplaced right inferior pubic ramus fracture.  T12 compression fracture age-indeterminate.  X-rays left lower extremity showed nondisplaced lateral tibial plateau fracture.  Orthopedic service Dr. Kendal consulted in regards to left clavicle fracture, left lateral compression pelvic ring injury and left lateral tibial plateau fracture felt to be nonoperative.  Placed in knee immobilizer for left lower extremity and touchdown weightbearing.  In regards to her left shoulder clavicle fracture again conservative care and nonweightbearing.  Patient was cleared for Lovenox  for DVT prophylaxis changing to aspirin 325 mg daily x 30 days on discharge for DVT prophylaxis.  Pain management as indicated with Neurontin  300  mg 3 times daily as well as lidocaine  patch with oxycodone  for breakthrough pain.  Psychiatry was consulted 12/26/2023 for stress reaction hospitalization and maintained on BuSpar .  Therapy evaluations completed due to patient decreased functional mobility was admitted for a comprehensive rehab program.  Review of Systems  Constitutional:  Negative for chills and fever.  HENT:  Negative for hearing loss.   Eyes:  Negative for blurred vision and double vision.  Respiratory:  Negative for cough, shortness of breath and wheezing.   Cardiovascular:  Negative for chest pain, palpitations and leg swelling.  Gastrointestinal:  Positive for constipation. Negative for heartburn, nausea and vomiting.  Genitourinary:  Negative for dysuria, flank pain and hematuria.  Musculoskeletal:  Positive for joint pain and myalgias.  Skin:  Negative for rash.  Psychiatric/Behavioral:         Anxiety  All other systems reviewed and are negative.  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 She has never used smokeless tobacco. She reports current alcohol use. She reports that she does not use drugs. Allergies: Allergies[1] Medications Prior to Admission  Medication Sig Dispense Refill   acetaminophen  (TYLENOL ) 500 MG tablet Take 500 mg by mouth every 6 (six) hours as needed.      acyclovir (ZOVIRAX) 200 MG capsule Take 200-600 mg by mouth See admin instructions. 400 mg in the morning and 600 in the evening if she has a break out     ALPRAZolam  (XANAX ) 0.5 MG tablet Take 0.5 mg by mouth daily as needed. Uses when flying or occasional sleep.     aspirin 81 MG EC tablet Take 162 mg by mouth at bedtime. Swallow whole.     Calcium -Vitamin D -Vitamin K (CALCIUM  SOFT CHEWS PO) Take 750 mg by mouth daily as needed (bone).     celecoxib (CELEBREX) 200 MG capsule Take 200 mg by mouth as needed.     Cholecalciferol  (VITAMIN D3) 50 MCG (2000 UT) CAPS Take by mouth 2 (two) times daily.     Elderberry 500 MG CAPS Take 1 capsule by mouth daily.     fluticasone (FLONASE) 50 MCG/ACT nasal spray Place 2 sprays into both nostrils as needed for allergies or rhinitis.     Magnesium  250 MG TABS Take 1 tablet by mouth 3 (three) times a week.     OVER THE COUNTER MEDICATION Take 1 tablet by mouth daily. 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 Status:  Mobility: Bed Mobility Overal bed mobility: Needs Assistance Bed Mobility: Supine to Sit Supine to sit: Min assist, HOB elevated, Used rails Sit to supine: Min assist, +2 for safety/equipment General bed mobility comments: in chair on arrival and end of session Transfers Overall transfer level: Needs assistance Equipment used: 1 person  hand held assist Transfers: Sit to/from Stand, Bed to chair/wheelchair/BSC Sit to Stand: Mod assist Bed to/from chair/wheelchair/BSC transfer type:: Squat pivot Stand pivot transfers: Mod assist Squat pivot transfers: Mod assist  Lateral/Scoot Transfers:  (initiated lateral scoots, 3 attempts with lateral movement of ~3 inches total to R side.) General transfer comment: mod assist to rise from chair to full standing with cues for TDWB LLE and use of quad cane in standing to stabilize, requires min assist to keep weight off LLE. squat pivot recliner <>WC toward Rt with mod assist, pt able to scoot to edge and setup but required mod assist for rise and pelvic control to pivot with LLE on pillow Ambulation/Gait General Gait Details: not yet able Wheelchair Mobility Wheelchair mobility: Yes Wheelchair propulsion: Right upper extremity, Right lower extremity Wheelchair parts: Needs assistance Distance: 50 Wheelchair Assistance Details (indicate cue type and reason): assist for brakes, leg rests positioning with daughter educated for [facility] parts and management. Assist to steer and direct RW with pt able to propel WC grossly 50' with RUE use, limited by fatigue and Rt hip pain.  ADL: ADL Overall  ADL's : Needs assistance/impaired Eating/Feeding: Set up, Sitting Grooming: Minimal assistance, Sitting Upper Body Bathing: Moderate assistance, Sitting Lower Body Bathing: Total assistance, Bed level Upper Body Dressing : Maximal assistance, Sitting Lower Body Dressing: Total assistance, +2 for physical assistance, Sit to/from stand Lower Body Dressing Details (indicate cue type and reason): assist to don mesh underwear, therapist assists to stand while daughter pulls over hips Toilet Transfer: Minimal assistance Toilet Transfer Details (indicate cue type and reason): stimulated from wheelchair to recliner towards R side Functional mobility during ADLs: Minimal assistance, Cueing for safety, Cueing for  sequencing General ADL Comments: cueing for sequencing and techniques.  initated educated on LB compensatory techniques.  Cognition: Cognition Orientation Level: Oriented X4 Cognition Arousal: Alert Behavior During Therapy: WFL for tasks assessed/performed  Physical Exam: Blood pressure (!) 96/58, pulse 81, temperature 97.9 F (36.6 C), temperature source Oral, resp. rate 16, height 5' 6 (1.676 m), weight 67.5 kg, SpO2 93%. Physical Exam Neurological:     Comments: Patient is alert and oriented x 3 following commands.  Cooperative with exam.     No results found for this or any previous visit (from the past 48 hours). No results found.    Blood pressure (!) 96/58, pulse 81, temperature 97.9 F (36.6 C), temperature source Oral, resp. rate 16, height 5' 6 (1.676 m), weight 67.5 kg, SpO2 93%.  Medical Problem List and Plan: 1. Functional deficits secondary to polytrauma after mechanical fall 2012 10/28/2023  -patient may *** shower  -ELOS/Goals: *** 2.  Antithrombotics: -DVT/anticoagulation:  Pharmaceutical: Lovenox .  Check vascular study.BEGIN ASPIRIN325 MG DAILY X 30 DAYS ON DISCHARGE FOR DVT PROPHYLAXIS  -antiplatelet therapy: N/A 3. Pain Management/chemo related neuropathy: Neurontin  300 mg 3 times daily, lidocaine  patch as directed, Advil  600 mg 3 times daily, Robaxin  750 mg 4 times daily, oxycodone  as needed for breakthrough pain 4. Mood/Behavior/Sleep: BuSpar  10 mg twice daily, Atarax  25 mg 3 times daily as needed anxiety  -antipsychotic agents: N/A 5. Neuropsych/cognition: This patient is capable of making decisions on her own behalf. 6. Skin/Wound Care: Routine skin checks 7. Fluids/Electrolytes/Nutrition: Routine in and outs with follow-up chemistries 8.  Left 3-7 rib fractures with trace PTX.  Conservative care. 9.  Left clavicle fracture.  Nonoperative/sling.  Nonweightbearing.  Follow-up Dr. Kendal 10.  Left pubic rami fracture with extension into the  acetabulum/left pubic bone fracture, right inferior pubic ramus fracture.  Touchdown weightbearing and conservative care 11.  Left tibial plateau fracture with moderate lipo hemarthrosis.  Touchdown weightbearing with knee immobilizer 12.  History of breast cancer status post bilateral mastectomy and chemo.  Follow-up outpatient Dr. Timmy 13.  Acute blood loss anemia.  Follow-up CBC 14.  Constipation.  Colace 100 mg twice daily, MiraLAX  twice daily   Toribio JINNY Pitch, PA-C 12/30/2023     [1]  Allergies Allergen Reactions   Sulfa Antibiotics Other (See Comments)    Drainage and red eyes only allergic to sulfa eye drops   Sulfonamide Derivatives Other (See Comments)    Eye drops only   Latex Itching and Dermatitis    Latex bandages   [facility] Astringent Eye Drops [Tetrahydrozoline-Zn Sulfate] Other (See Comments)   "

## 2023-12-27 NOTE — Plan of Care (Signed)
" °  Problem: Education: Goal: Knowledge of General Education information will improve Description: Including pain rating scale, medication(s)/side effects and non-pharmacologic comfort measures 12/27/2023 0015 by Marvis Kenneth SAILOR, RN Outcome: Progressing 12/26/2023 2339 by Marvis Kenneth SAILOR, RN Outcome: Progressing   Problem: Health Behavior/Discharge Planning: Goal: Ability to manage health-related needs will improve 12/27/2023 0015 by Marvis Kenneth SAILOR, RN Outcome: Progressing 12/26/2023 2339 by Marvis Kenneth SAILOR, RN Outcome: Progressing   Problem: Clinical Measurements: Goal: Ability to maintain clinical measurements within normal limits will improve 12/27/2023 0015 by Marvis Kenneth SAILOR, RN Outcome: Progressing 12/26/2023 2339 by Marvis Kenneth SAILOR, RN Outcome: Progressing Goal: Will remain free from infection 12/27/2023 0015 by Marvis Kenneth SAILOR, RN Outcome: Progressing 12/26/2023 2339 by Marvis Kenneth SAILOR, RN Outcome: Progressing Goal: Diagnostic test results will improve 12/27/2023 0015 by Marvis Kenneth SAILOR, RN Outcome: Progressing 12/26/2023 2339 by Marvis Kenneth SAILOR, RN Outcome: Progressing Goal: Respiratory complications will improve 12/27/2023 0015 by Marvis Kenneth SAILOR, RN Outcome: Progressing 12/26/2023 2339 by Marvis Kenneth SAILOR, RN Outcome: Progressing Goal: Cardiovascular complication will be avoided 12/27/2023 0015 by Marvis Kenneth SAILOR, RN Outcome: Progressing 12/26/2023 2339 by Marvis Kenneth SAILOR, RN Outcome: Progressing   Problem: Activity: Goal: Risk for activity intolerance will decrease 12/27/2023 0015 by Marvis Kenneth SAILOR, RN Outcome: Progressing 12/26/2023 2339 by Marvis Kenneth SAILOR, RN Outcome: Progressing   Problem: Nutrition: Goal: Adequate nutrition will be maintained 12/27/2023 0015 by Marvis Kenneth SAILOR, RN Outcome: Progressing 12/26/2023 2339 by Marvis Kenneth SAILOR, RN Outcome: Progressing   Problem: Coping: Goal: Level  of anxiety will decrease 12/27/2023 0015 by Marvis Kenneth SAILOR, RN Outcome: Progressing 12/26/2023 2339 by Marvis Kenneth SAILOR, RN Outcome: Progressing   Problem: Elimination: Goal: Will not experience complications related to bowel motility 12/27/2023 0015 by Marvis Kenneth SAILOR, RN Outcome: Progressing 12/26/2023 2339 by Marvis Kenneth SAILOR, RN Outcome: Progressing Goal: Will not experience complications related to urinary retention 12/27/2023 0015 by Marvis Kenneth SAILOR, RN Outcome: Progressing 12/26/2023 2339 by Marvis Kenneth SAILOR, RN Outcome: Progressing   Problem: Pain Managment: Goal: General experience of comfort will improve and/or be controlled 12/27/2023 0015 by Marvis Kenneth SAILOR, RN Outcome: Progressing 12/26/2023 2339 by Marvis Kenneth SAILOR, RN Outcome: Progressing   Problem: Safety: Goal: Ability to remain free from injury will improve 12/27/2023 0015 by Marvis Kenneth SAILOR, RN Outcome: Progressing 12/26/2023 2339 by Marvis Kenneth SAILOR, RN Outcome: Progressing   Problem: Skin Integrity: Goal: Risk for impaired skin integrity will decrease 12/27/2023 0015 by Marvis Kenneth SAILOR, RN Outcome: Progressing 12/26/2023 2339 by Marvis Kenneth SAILOR, RN Outcome: Progressing   "

## 2023-12-27 NOTE — Progress Notes (Signed)
 Inpatient Rehab Admissions Coordinator:  Peer-to-peer completed and insurance denied. Pt and family informed and would like to do an expedited appeal. Expedited appeal started. Will continue to follow.   Tinnie Yvone Cohens, MS, CCC-SLP Admissions Coordinator [phone]

## 2023-12-27 NOTE — Progress Notes (Signed)
 Physical Therapy Treatment Patient Details Name: Margaret English MRN: [id number] DOB: 02/23/1945 Today's Date: 12/27/2023   History of Present Illness 78 y.o. F adm 12/18/23 after a fall with Lt PTX, Lt 3-7 rib fx, Lt clavicle fx, T12 compression fx (chronic), multiple Lt pelvic fxs and Rt inferior pubic rami fxs, Lt tibial plateau fx. PMHx: breast CA s/p mastectomy, cataract, CKD, osteoporosis.    PT Comments  Pt, spouse and daughter all present throughout session. Discussed options of transfers for mobility progression. Attempted standing with quad cane to ease burden of care during pericare but pt struggles with maintaining LLE TDWB with static standing and needs assist for balance and precautions. Pt educated for squat pivot transfers this date with mod assist for rise to standing and pivots. Pt with Rt hip pain and soreness from reliance on RLE with transfers and unable tolerate using RLE to propel WC and relied on RUE. Pt and daughter educated for [facility] parts and management with setup and transfers. Patient will benefit from intensive inpatient follow-up therapy, >3 hours/day. Will continue to follow.      If plan is discharge home, recommend the following: Assistance with cooking/housework;Assist for transportation;Help with stairs or ramp for entrance;A lot of help with walking and/or transfers;A lot of help with bathing/dressing/bathroom   Can travel by private vehicle        Equipment Recommendations  Wheelchair (measurements PT);Wheelchair cushion (measurements PT);BSC/3in1    Recommendations for Other Services       Precautions / Restrictions Precautions Precautions: Fall Recall of Precautions/Restrictions: Intact Required Braces or Orthoses: Sling;Knee Immobilizer - Left Knee Immobilizer - Left: On at all times Restrictions LUE Weight Bearing Per Provider Order: Non weight bearing LLE Weight Bearing Per Provider Order: Touchdown weight bearing     Mobility  Bed Mobility                General bed mobility comments: in chair on arrival and end of session    Transfers Overall transfer level: Needs assistance   Transfers: Sit to/from Stand, Bed to chair/wheelchair/BSC Sit to Stand: Mod assist     Squat pivot transfers: Mod assist     General transfer comment: mod assist to rise from chair to full standing with cues for TDWB LLE and use of quad cane in standing to stabilize, requires min assist to keep weight off LLE. squat pivot recliner <>WC toward Rt with mod assist, pt able to scoot to edge and setup but required mod assist for rise and pelvic control to pivot with LLE on pillow    Ambulation/Gait               General Gait Details: not yet able   Psychologist, Counselling mobility: Yes Wheelchair propulsion: Right upper extremity, Right lower extremity Wheelchair parts: Needs assistance Distance: 50 Wheelchair Assistance Details (indicate cue type and reason): assist for brakes, leg rests positioning with daughter educated for [facility] parts and management. Assist to steer and direct RW with pt able to propel WC grossly 50' with RUE use, limited by fatigue and Rt hip pain.   Tilt Bed    Modified Rankin (Stroke Patients Only)       Balance Overall balance assessment: Needs assistance Sitting-balance support: Feet supported, Single extremity supported, No upper extremity supported Sitting balance-Leahy Scale: Good Sitting balance - Comments: sitting without support   Standing balance support: Single extremity supported, During  functional activity Standing balance-Leahy Scale: Poor Standing balance comment: quad cane and physical assist for balance and to maintain TDWB LLE                            Communication Communication Communication: Impaired Factors Affecting Communication: Hearing impaired  Cognition Arousal: Alert Behavior During Therapy: WFL for tasks  assessed/performed   PT - Cognitive impairments: No apparent impairments                       PT - Cognition Comments: pt able to state precautions and don/doff sling Following commands: Intact      Cueing Cueing Techniques: Verbal cues, Gestural cues  Exercises      General Comments        Pertinent Vitals/Pain Pain Assessment Pain Score: 8  Pain Location: Rt hip Pain Descriptors / Indicators: Aching, Guarding, Sore Pain Intervention(s): Limited activity within patient's tolerance, Monitored during session, Premedicated before session, Repositioned    Home Living                          Prior Function            PT Goals (current goals can now be found in the care plan section) Progress towards PT goals: Progressing toward goals    Frequency    Min 3X/week      PT Plan      Co-evaluation              AM-PAC PT 6 Clicks Mobility   Outcome Measure  Help needed turning from your back to your side while in a flat bed without using bedrails?: A Little Help needed moving from lying on your back to sitting on the side of a flat bed without using bedrails?: A Little Help needed moving to and from a bed to a chair (including a wheelchair)?: A Lot Help needed standing up from a chair using your arms (e.g., wheelchair or bedside chair)?: A Lot Help needed to walk in hospital room?: Total Help needed climbing 3-5 steps with a railing? : Total 6 Click Score: 12    End of Session Equipment Utilized During Treatment: Gait belt;Left knee immobilizer;Other (comment) (sling) Activity Tolerance: Patient tolerated treatment well Patient left: in chair;with family/visitor present Nurse Communication: Precautions;Mobility status PT Visit Diagnosis: Other abnormalities of gait and mobility (R26.89);Muscle weakness (generalized) (M62.81);Pain     Time: 8894-8848 PT Time Calculation (min) (ACUTE ONLY): 46 min  Charges:    $Therapeutic  Activity: 23-37 mins $Wheel Chair Management: 8-22 mins PT General Charges $$ ACUTE PT VISIT: 1 Visit                     Lenoard SQUIBB, PT Acute Rehabilitation Services Office: [phone]    Lenoard NOVAK Saren Corkern 12/27/2023, 12:43 PM

## 2023-12-27 NOTE — Progress Notes (Signed)
" ° °  Trauma/Critical Care Follow Up Note  Subjective:    Overnight Issues:   Objective:  Vital signs for last 24 hours: Temp:  [98.1 F (36.7 C)-99.4 F (37.4 C)] 98.1 F (36.7 C) (12/19 0343) Pulse Rate:  [90-100] 90 (12/19 0343) Resp:  [17-18] 17 (12/19 0343) BP: (114-136)/(57-73) 136/73 (12/19 0343) SpO2:  [90 %-94 %] 94 % (12/19 0343)  Intake/Output from previous day: No intake/output data recorded.  Intake/Output this shift: No intake/output data recorded.  Vent settings for last 24 hours:    Physical Exam:  Gen: comfortable, no distress Neuro: follows commands, alert, communicative HEENT: PERRL Neck: supple CV: RRR Pulm: unlabored breathing on RA Abd: soft, NT  , +BM GU: urine clear and yellow, +spontaneous voids Extr: wwp, no edema  No results found for this or any previous visit (from the past 24 hours).  Assessment & Plan:  LOS: 9 days   Additional comments:I reviewed the patient's new clinical lab test results.   and I reviewed the patients new imaging test results.    Mechanical fall (garage steps) Left 3-7 rib fxs with trace PTX - multimodal pain control, pulm toilet, IS, mucinex  for cough, wean o2 Left clavicle fx - sling, non-op mgmt per Dr. Kendal, NWB Chronic T12 compression fx - pt reports this is from 3-4 years ago Left pubic rami fxs with extension into acetabulum - Per Ortho, Dr. Kendal, TDWB LLE Left pubic bone fx - Per Ortho, Dr. Kendal, TDWB LLE Right inferior pubic ramus fx - per ortho Dr. Kendal Left tibial plateau fx with moderate lipohemarthrosis - Per Ortho, Dr. Kendal, TDWB LLE in KI  Hx of breast CA s/p bilateral mastectomy and chemo  Chemo related neuropathy  Osteoporosis- resume home vitD Acute stress reaction/anxiety - psych consult, buspar  and atarax  FEN: reg diet, continue bowel regimen VTE: SCD, LMWH ID: no current indication for abx Foley: None, spont void Dispo: PT/OT - CIR. Auth pending. Medically stable for  discharge  Dreama GEANNIE Hanger, MD Trauma & General Surgery Please use [email or web address] to contact on call provider  12/27/2023  *Care during the described time interval was provided by me. I have reviewed this patient's available data, including medical history, events of note, physical examination and test results as part of my evaluation.    "

## 2023-12-28 NOTE — Plan of Care (Signed)
" °  Problem: Education: Goal: Knowledge of General Education information will improve Description: Including pain rating scale, medication(s)/side effects and non-pharmacologic comfort measures 12/28/2023 0011 by Marvis Kenneth SAILOR, RN Outcome: Progressing 12/27/2023 2037 by Marvis Kenneth SAILOR, RN Outcome: Progressing   Problem: Health Behavior/Discharge Planning: Goal: Ability to manage health-related needs will improve 12/28/2023 0011 by Marvis Kenneth SAILOR, RN Outcome: Progressing 12/27/2023 2037 by Marvis Kenneth SAILOR, RN Outcome: Progressing   Problem: Clinical Measurements: Goal: Ability to maintain clinical measurements within normal limits will improve 12/28/2023 0011 by Marvis Kenneth SAILOR, RN Outcome: Progressing 12/27/2023 2037 by Marvis Kenneth SAILOR, RN Outcome: Progressing Goal: Will remain free from infection 12/28/2023 0011 by Marvis Kenneth SAILOR, RN Outcome: Progressing 12/27/2023 2037 by Marvis Kenneth SAILOR, RN Outcome: Progressing Goal: Diagnostic test results will improve 12/28/2023 0011 by Marvis Kenneth SAILOR, RN Outcome: Progressing 12/27/2023 2037 by Marvis Kenneth SAILOR, RN Outcome: Progressing Goal: Respiratory complications will improve 12/28/2023 0011 by Marvis Kenneth SAILOR, RN Outcome: Progressing 12/27/2023 2037 by Marvis Kenneth SAILOR, RN Outcome: Progressing Goal: Cardiovascular complication will be avoided 12/28/2023 0011 by Marvis Kenneth SAILOR, RN Outcome: Progressing 12/27/2023 2037 by Marvis Kenneth SAILOR, RN Outcome: Progressing   Problem: Activity: Goal: Risk for activity intolerance will decrease 12/28/2023 0011 by Marvis Kenneth SAILOR, RN Outcome: Progressing 12/27/2023 2037 by Marvis Kenneth SAILOR, RN Outcome: Progressing   Problem: Nutrition: Goal: Adequate nutrition will be maintained 12/28/2023 0011 by Marvis Kenneth SAILOR, RN Outcome: Progressing 12/27/2023 2037 by Marvis Kenneth SAILOR, RN Outcome: Progressing   Problem: Coping: Goal: Level  of anxiety will decrease 12/28/2023 0011 by Marvis Kenneth SAILOR, RN Outcome: Progressing 12/27/2023 2037 by Marvis Kenneth SAILOR, RN Outcome: Progressing   Problem: Elimination: Goal: Will not experience complications related to bowel motility 12/28/2023 0011 by Marvis Kenneth SAILOR, RN Outcome: Progressing 12/27/2023 2037 by Marvis Kenneth SAILOR, RN Outcome: Progressing Goal: Will not experience complications related to urinary retention 12/28/2023 0011 by Marvis Kenneth SAILOR, RN Outcome: Progressing 12/27/2023 2037 by Marvis Kenneth SAILOR, RN Outcome: Progressing   Problem: Pain Managment: Goal: General experience of comfort will improve and/or be controlled 12/28/2023 0011 by Marvis Kenneth SAILOR, RN Outcome: Progressing 12/27/2023 2037 by Marvis Kenneth SAILOR, RN Outcome: Progressing   Problem: Safety: Goal: Ability to remain free from injury will improve 12/28/2023 0011 by Marvis Kenneth SAILOR, RN Outcome: Progressing 12/27/2023 2037 by Marvis Kenneth SAILOR, RN Outcome: Progressing   Problem: Skin Integrity: Goal: Risk for impaired skin integrity will decrease 12/28/2023 0011 by Marvis Kenneth SAILOR, RN Outcome: Progressing 12/27/2023 2037 by Marvis Kenneth SAILOR, RN Outcome: Progressing   "

## 2023-12-28 NOTE — Progress Notes (Signed)
 Patient ID: Margaret English, female   DOB: 1945/05/25, 78 y.o.   MRN: [id number]      Subjective: Up in chair, doing better, BM ROS negative except as listed above. Objective: Vital signs in last 24 hours: Temp:  [98.1 F (36.7 C)-99 F (37.2 C)] 98.3 F (36.8 C) (12/20 0714) Pulse Rate:  [86-95] 90 (12/20 0714) Resp:  [18-20] 18 (12/20 0714) BP: (114-144)/(59-77) 128/73 (12/20 0714) SpO2:  [91 %-97 %] 97 % (12/20 0714) Last BM Date : 12/27/23  Intake/Output from previous day: 12/19 0701 - 12/20 0700 In: 120 [P.O.:120] Out: 200 [Urine:200] Intake/Output this shift: No intake/output data recorded.  General appearance: alert and cooperative GI: soft, NT Extremities: LLE ace, foot warm  Lab Results: CBC  No results for input(s): WBC, HGB, HCT, PLT in the last 72 hours. BMET No results for input(s): NA, K, CL, CO2, GLUCOSE, BUN, CREATININE, CALCIUM  in the last 72 hours. PT/INR No results for input(s): LABPROT, INR in the last 72 hours. ABG No results for input(s): PHART, HCO3 in the last 72 hours.  Invalid input(s): PCO2, PO2  Studies/Results: No results found.  Anti-infectives: Anti-infectives (From admission, onward)    None       Assessment/Plan: Mechanical fall (garage steps) Left 3-7 rib fxs with trace PTX - multimodal pain control, pulm toilet, IS, mucinex  for cough, wean o2 Left clavicle fx - sling, non-op mgmt per Dr. Kendal, NWB Chronic T12 compression fx - pt reports this is from 3-4 years ago Left pubic rami fxs with extension into acetabulum - Per Ortho, Dr. Kendal, TDWB LLE Left pubic bone fx - Per Ortho, Dr. Kendal, TDWB LLE Right inferior pubic ramus fx - per ortho Dr. Kendal Left tibial plateau fx with moderate lipohemarthrosis - Per Ortho, Dr. Kendal, TDWB LLE in [place]  Hx of breast CA s/p bilateral mastectomy and chemo  Chemo related neuropathy  Osteoporosis- resume home vitD Acute stress reaction/anxiety - psych  consult, buspar  and atarax  FEN: reg diet, continue bowel regimen VTE: SCD, LMWH ID: no current indication for abx Foley: None, spont void Dispo: PT/OT - CIR denied by insurance - appeal pending. Medically stable for discharge. TOC working on SNF as back-up dispo if CIR appeal denied. I also spoke with her daughter.   LOS: 10 days    Dann Hummer, MD, MPH, FACS Trauma & General Surgery Use [email or web address] to contact on call provider  12/28/2023

## 2023-12-29 MED ORDER — VITAMIN D 25 MCG (1000 UNIT) PO TABS
4000.0000 [IU] | ORAL_TABLET | Freq: Every day | ORAL | Status: DC
  Administered 2023-12-29 – 2024-01-03 (×6): 4000 [IU] via ORAL
  Filled 2023-12-29 (×6): qty 4

## 2023-12-29 NOTE — Progress Notes (Signed)
 Patient ID: Margaret English, female   DOB: Dec 14, 1945, 78 y.o.   MRN: [id number]       Subjective: No acute changes. Reports some anxiety but feels medications are helping. Pain is controlled.  Objective: Vital signs in last 24 hours: Temp:  [97.9 F (36.6 C)-99.3 F (37.4 C)] 98.5 F (36.9 C) (12/21 0800) Pulse Rate:  [79-96] 86 (12/21 0800) Resp:  [16-20] 16 (12/21 0800) BP: (102-127)/(60-72) 117/68 (12/21 0800) SpO2:  [90 %-96 %] 96 % (12/21 0800) Last BM Date : 12/28/23  Intake/Output from previous day: No intake/output data recorded. Intake/Output this shift: No intake/output data recorded.  General: sitting up in bed, NAD Neuro: alert and oriented, no focal deficits Resp: normal work of breathing on room air CV: RRR Extremities: warm and well-perfused, ACE wrap and knee immobilizer in place LLE   Lab Results: CBC  No results for input(s): WBC, HGB, HCT, PLT in the last 72 hours. BMET No results for input(s): NA, K, CL, CO2, GLUCOSE, BUN, CREATININE, CALCIUM  in the last 72 hours. PT/INR No results for input(s): LABPROT, INR in the last 72 hours. ABG No results for input(s): PHART, HCO3 in the last 72 hours.  Invalid input(s): PCO2, PO2  Studies/Results: No results found.  Anti-infectives: Anti-infectives (From admission, onward)    None       Assessment/Plan: Mechanical fall (garage steps) Left 3-7 rib fxs with trace PTX - multimodal pain control, pulm toilet, IS, mucinex  for cough, wean o2 Left clavicle fx - sling, non-op mgmt per Dr. Kendal, NWB Chronic T12 compression fx - pt reports this is from 3-4 years ago Left pubic rami fxs with extension into acetabulum - Per Ortho, Dr. Kendal, TDWB LLE Left pubic bone fx - Per Ortho, Dr. Kendal, TDWB LLE Right inferior pubic ramus fx - per ortho Dr. Kendal Left tibial plateau fx with moderate lipohemarthrosis - Per Ortho, Dr. Kendal, TDWB LLE in [name]. Patient would like to  shower, ok to remove knee immobilizer for showering per ortho notes. Hx of breast CA s/p bilateral mastectomy and chemo  Chemo related neuropathy  Osteoporosis- resume home vitD Acute stress reaction/anxiety - psych consult, buspar  and atarax  FEN: reg diet, continue bowel regimen VTE: SCD, LMWH ID: no current indication for abx Foley: None, spont void Dispo: PT/OT - CIR denied by insurance - appeal pending. Medically stable for discharge. TOC working on SNF as back-up dispo if CIR appeal denied. I spoke with patient and her family at bedside.   LOS: 11 days    Margaret Dawn, MD [facility] Surgery General, Hepatobiliary and Pancreatic Surgery 12/29/2023 11:26 AM

## 2023-12-29 NOTE — Plan of Care (Signed)
" °  Problem: Education: Goal: Knowledge of General Education information will improve Description: Including pain rating scale, medication(s)/side effects and non-pharmacologic comfort measures 12/29/2023 0006 by Marvis Kenneth SAILOR, RN Outcome: Progressing 12/28/2023 2015 by Marvis Kenneth SAILOR, RN Outcome: Progressing   Problem: Health Behavior/Discharge Planning: Goal: Ability to manage health-related needs will improve 12/29/2023 0006 by Marvis Kenneth SAILOR, RN Outcome: Progressing 12/28/2023 2015 by Marvis Kenneth SAILOR, RN Outcome: Progressing   Problem: Clinical Measurements: Goal: Ability to maintain clinical measurements within normal limits will improve 12/29/2023 0006 by Marvis Kenneth SAILOR, RN Outcome: Progressing 12/28/2023 2015 by Marvis Kenneth SAILOR, RN Outcome: Progressing Goal: Will remain free from infection 12/29/2023 0006 by Marvis Kenneth SAILOR, RN Outcome: Progressing 12/28/2023 2015 by Marvis Kenneth SAILOR, RN Outcome: Progressing Goal: Diagnostic test results will improve 12/29/2023 0006 by Marvis Kenneth SAILOR, RN Outcome: Progressing 12/28/2023 2015 by Marvis Kenneth SAILOR, RN Outcome: Progressing Goal: Respiratory complications will improve 12/29/2023 0006 by Marvis Kenneth SAILOR, RN Outcome: Progressing 12/28/2023 2015 by Marvis Kenneth SAILOR, RN Outcome: Progressing Goal: Cardiovascular complication will be avoided 12/29/2023 0006 by Marvis Kenneth SAILOR, RN Outcome: Progressing 12/28/2023 2015 by Marvis Kenneth SAILOR, RN Outcome: Progressing   Problem: Activity: Goal: Risk for activity intolerance will decrease 12/29/2023 0006 by Marvis Kenneth SAILOR, RN Outcome: Progressing 12/28/2023 2015 by Marvis Kenneth SAILOR, RN Outcome: Progressing   Problem: Nutrition: Goal: Adequate nutrition will be maintained 12/29/2023 0006 by Marvis Kenneth SAILOR, RN Outcome: Progressing 12/28/2023 2015 by Marvis Kenneth SAILOR, RN Outcome: Progressing   Problem: Coping: Goal: Level  of anxiety will decrease 12/29/2023 0006 by Marvis Kenneth SAILOR, RN Outcome: Progressing 12/28/2023 2015 by Marvis Kenneth SAILOR, RN Outcome: Progressing   Problem: Elimination: Goal: Will not experience complications related to bowel motility 12/29/2023 0006 by Marvis Kenneth SAILOR, RN Outcome: Progressing 12/28/2023 2015 by Marvis Kenneth SAILOR, RN Outcome: Progressing Goal: Will not experience complications related to urinary retention 12/29/2023 0006 by Marvis Kenneth SAILOR, RN Outcome: Progressing 12/28/2023 2015 by Marvis Kenneth SAILOR, RN Outcome: Progressing   Problem: Pain Managment: Goal: General experience of comfort will improve and/or be controlled 12/29/2023 0006 by Marvis Kenneth SAILOR, RN Outcome: Progressing 12/28/2023 2015 by Marvis Kenneth SAILOR, RN Outcome: Progressing   Problem: Safety: Goal: Ability to remain free from injury will improve 12/29/2023 0006 by Marvis Kenneth SAILOR, RN Outcome: Progressing 12/28/2023 2015 by Marvis Kenneth SAILOR, RN Outcome: Progressing   Problem: Skin Integrity: Goal: Risk for impaired skin integrity will decrease 12/29/2023 0006 by Marvis Kenneth SAILOR, RN Outcome: Progressing 12/28/2023 2015 by Marvis Kenneth SAILOR, RN Outcome: Progressing   Problem: Education: Goal: Required Educational Video(s) 12/29/2023 0006 by Marvis Kenneth SAILOR, RN Outcome: Progressing 12/28/2023 2015 by Marvis Kenneth SAILOR, RN Outcome: Progressing   Problem: Clinical Measurements: Goal: Ability to maintain clinical measurements within normal limits will improve 12/29/2023 0006 by Marvis Kenneth SAILOR, RN Outcome: Progressing 12/28/2023 2015 by Marvis Kenneth SAILOR, RN Outcome: Progressing Goal: Postoperative complications will be avoided or minimized 12/29/2023 0006 by Marvis Kenneth SAILOR, RN Outcome: Progressing 12/28/2023 2015 by Marvis Kenneth SAILOR, RN Outcome: Progressing   Problem: Skin Integrity: Goal: Demonstration of wound healing without infection  will improve 12/29/2023 0006 by Marvis Kenneth SAILOR, RN Outcome: Progressing 12/28/2023 2015 by Marvis Kenneth SAILOR, RN Outcome: Progressing   "

## 2023-12-30 NOTE — TOC Progression Note (Addendum)
 Transition of Care [facility]) - Progression Note    Patient Details  Name: JOZETTE CASTRELLON MRN: [id number] Date of Birth: October 02, 1945  Transition of Care [facility]) CM/SW Contact  Aubryn Spinola E Abir Eroh, LCSW Phone Number: 12/30/2023, 10:32 AM  Clinical Narrative:    Reached out to CIR to request update on appeal - appeal is still pending.   Expected Discharge Plan: IP Rehab Facility Barriers to Discharge: Continued Medical Work up               Expected Discharge Plan and Services   Discharge Planning Services: CM Consult                                           Social Drivers of Health (SDOH) Interventions SDOH Screenings   Food Insecurity: No Food Insecurity (12/19/2023)  Housing: Unknown (12/19/2023)  Transportation Needs: No Transportation Needs (12/19/2023)  Utilities: Not At Risk (12/19/2023)  Depression (PHQ2-9): Low Risk (12/04/2023)  Social Connections: Moderately Integrated (12/19/2023)  Tobacco Use: Low Risk (12/18/2023)    Readmission Risk Interventions     No data to display

## 2023-12-30 NOTE — Progress Notes (Signed)
 Patient ID: Margaret English, female   DOB: 12-12-1945, 78 y.o.   MRN: [id number]       Subjective: No acute changes. L rib and pelvic pain well controlled. No sob. On RA this am. Tolerating po. No n/v. BM this AM. Voiding. Up to the bathroom with staff this AM.   Objective: Vital signs in last 24 hours: Temp:  [97.9 F (36.6 C)-98.5 F (36.9 C)] 97.9 F (36.6 C) (12/22 0800) Pulse Rate:  [81-105] 105 (12/22 0800) Resp:  [16-18] 16 (12/22 0800) BP: (96-130)/(58-77) 130/76 (12/22 0800) SpO2:  [90 %-94 %] 90 % (12/22 0800) Last BM Date : 12/28/23  Intake/Output from previous day: No intake/output data recorded. Intake/Output this shift: No intake/output data recorded.  PE: Gen: NAD, lying in bed Heart: regular Lungs: CTA, respiratory effort nonlabored, on RA Abd: soft, ND, NT Ext: MAE's, KI and ace on LLE.  LLE wwp Psych: A&Ox3  Lab Results: CBC  No results for input(s): WBC, HGB, HCT, PLT in the last 72 hours. BMET No results for input(s): NA, K, CL, CO2, GLUCOSE, BUN, CREATININE, CALCIUM  in the last 72 hours. PT/INR No results for input(s): LABPROT, INR in the last 72 hours. ABG No results for input(s): PHART, HCO3 in the last 72 hours.  Invalid input(s): PCO2, PO2  Studies/Results: No results found.  Anti-infectives: Anti-infectives (From admission, onward)    None       Assessment/Plan: Mechanical fall (garage steps) Left 3-7 rib fxs with trace PTX - multimodal pain control, pulm toilet, IS Left clavicle fx - sling, non-op mgmt per Dr. Kendal, NWB Chronic T12 compression fx - pt reports this is from 3-4 years ago Left pubic rami fxs with extension into acetabulum - Per Ortho, Dr. Kendal, TDWB LLE Left pubic bone fx - Per Ortho, Dr. Kendal, TDWB LLE Right inferior pubic ramus fx - per ortho Dr. Kendal Left tibial plateau fx with moderate lipohemarthrosis - Per Ortho, Dr. Kendal, TDWB LLE in [place]. Patient would like to shower,  ok to remove knee immobilizer for showering per ortho notes. Hx of breast CA s/p bilateral mastectomy and chemo  Chemo related neuropathy  Osteoporosis- resume home vitD Acute stress reaction/anxiety - psych consult, buspar  and atarax  FEN: reg diet, continue bowel regimen VTE: SCD, LMWH ID: no current indication for abx Foley: None, spont void Dispo: PT/OT - CIR denied by insurance - appeal pending. Medically stable for discharge. TOC working on SNF as back-up dispo if CIR appeal denied.   LOS: 12 days    Margaret Dawn, MD [facility] Surgery General, Hepatobiliary and Pancreatic Surgery 12/30/2023 9:14 AM

## 2023-12-30 NOTE — Progress Notes (Signed)
 Physical Therapy Treatment Patient Details Name: Margaret English MRN: [id number] DOB: 01-Feb-1945 Today's Date: 12/30/2023   History of Present Illness 78 y.o. F adm 12/18/23 after a fall with Lt PTX, Lt 3-7 rib fx, Lt clavicle fx, T12 compression fx (chronic), multiple Lt pelvic fxs and Rt inferior pubic rami fxs, Lt tibial plateau fx. PMHx: breast CA s/p mastectomy, cataract, CKD, osteoporosis.    PT Comments  Pt pleasant, reports improved pain management and increased transfers with staff. Pt with decreased physical assist to rise and improved awareness of DME positioning for transfers. Pt able to propel WC further but requires assist for steering and direction due to limited Left side function. Pt educated for LB HEp as well as continued progression. Patient will benefit from intensive inpatient follow-up therapy, >3 hours/day     If plan is discharge home, recommend the following: Assistance with cooking/housework;Assist for transportation;Help with stairs or ramp for entrance;A lot of help with walking and/or transfers;A lot of help with bathing/dressing/bathroom   Can travel by private vehicle        Equipment Recommendations  Wheelchair (measurements PT);Wheelchair cushion (measurements PT);BSC/3in1    Recommendations for Other Services       Precautions / Restrictions Precautions Precautions: Fall Recall of Precautions/Restrictions: Intact Required Braces or Orthoses: Sling;Knee Immobilizer - Left Knee Immobilizer - Left: On at all times Restrictions LUE Weight Bearing Per Provider Order: Non weight bearing LLE Weight Bearing Per Provider Order: Touchdown weight bearing Other Position/Activity Restrictions: per ortho note 12/12 okay for elbow distal ROM L UE     Mobility  Bed Mobility Overal bed mobility: Needs Assistance Bed Mobility: Supine to Sit     Supine to sit: Min assist, HOB elevated, Used rails     General bed mobility comments: min assist to move LLE,  reliant on rail and HOB elevated to pivot to EOB with increased time    Transfers Overall transfer level: Needs assistance   Transfers: Bed to chair/wheelchair/BSC       Squat pivot transfers: Min assist     General transfer comment: min assist to rise and pivot bed>BSC>WC>recliner. cues and assist for LLE postioning, equipment setup and assist to balance and rise.    Ambulation/Gait               General Gait Details: not yet able   Psychologist, Counselling mobility: Yes Wheelchair propulsion: Right upper extremity, Right lower extremity Distance: 100' Wheelchair Assistance Details (indicate cue type and reason): assist for brakes, leg rests positioning with pt and daughter educated for [facility] parts and management. Assist to steer and direct RW with pt able to propel WC 100' with RUE and RLE use, limited by fatigue with push back to room and assist to back into room and position for transfers.   Tilt Bed    Modified Rankin (Stroke Patients Only)       Balance Overall balance assessment: Needs assistance Sitting-balance support: Feet supported, Single extremity supported, No upper extremity supported Sitting balance-Leahy Scale: Good Sitting balance - Comments: sitting without support                                    Communication Communication Communication: No apparent difficulties  Cognition Arousal: Alert Behavior During Therapy: WFL for tasks assessed/performed   PT - Cognitive impairments: No  apparent impairments                       PT - Cognition Comments: pt able to state precautions and don/doff sling Following commands: Intact      Cueing Cueing Techniques: Verbal cues, Gestural cues  Exercises General Exercises - Lower Extremity Long Arc Quad: AROM, Right, Seated, 15 reps, Strengthening Straight Leg Raises: AAROM, Left, Seated, 10 reps Hip Flexion/Marching: AROM,  Right, Seated, 10 reps, Strengthening    General Comments        Pertinent Vitals/Pain Pain Assessment Pain Score: 4  Pain Location: Rt hip Pain Descriptors / Indicators: Aching, Guarding, Sore Pain Intervention(s): Limited activity within patient's tolerance, Monitored during session, Premedicated before session, Repositioned    Home Living                          Prior Function            PT Goals (current goals can now be found in the care plan section) Acute Rehab PT Goals Patient Stated Goal: to return to prior level of function, improve transfer quality PT Goal Formulation: With patient/family Time For Goal Achievement: 01/13/24 Potential to Achieve Goals: Fair Progress towards PT goals: Progressing toward goals    Frequency    Min 3X/week      PT Plan      Co-evaluation              AM-PAC PT 6 Clicks Mobility   Outcome Measure  Help needed turning from your back to your side while in a flat bed without using bedrails?: A Little Help needed moving from lying on your back to sitting on the side of a flat bed without using bedrails?: A Little Help needed moving to and from a bed to a chair (including a wheelchair)?: A Lot Help needed standing up from a chair using your arms (e.g., wheelchair or bedside chair)?: A Little Help needed to walk in hospital room?: Total Help needed climbing 3-5 steps with a railing? : Total 6 Click Score: 13    End of Session Equipment Utilized During Treatment: Gait belt;Left knee immobilizer;Other (comment) (sling) Activity Tolerance: Patient tolerated treatment well Patient left: in chair;with family/visitor present Nurse Communication: Precautions;Mobility status PT Visit Diagnosis: Other abnormalities of gait and mobility (R26.89);Muscle weakness (generalized) (M62.81);Pain     Time: 8890-8854 PT Time Calculation (min) (ACUTE ONLY): 36 min  Charges:    $Therapeutic Exercise: 8-22  mins $Therapeutic Activity: 8-22 mins PT General Charges $$ ACUTE PT VISIT: 1 Visit                     Lenoard SQUIBB, PT Acute Rehabilitation Services Office: [phone]    Lenoard KATHEE Docker 12/30/2023, 12:47 PM

## 2023-12-30 NOTE — Plan of Care (Signed)

## 2023-12-30 NOTE — Progress Notes (Signed)
 Occupational Therapy Treatment Patient Details Name: Margaret English MRN: [id number] DOB: Jul 10, 1945 Today's Date: 12/30/2023   History of present illness 78 y.o. F adm 12/18/23 after a fall with Lt PTX, Lt 3-7 rib fx, Lt clavicle fx, T12 compression fx (chronic), multiple Lt pelvic fxs and Rt inferior pubic rami fxs, Lt tibial plateau fx. PMHx: breast CA s/p mastectomy, cataract, CKD, osteoporosis.   OT comments  Pt progressing well toward established OT goals. Pt with improved recall of transfer techniques but requires cues for optimal functional implementation. Focus session on return to ADL and education regarding performance within precautions. Pt needing repeated cues for reducing ROM of L shoulder during ADL. Pt needing dense assist for LB ADL; educated regarding other methods for pericare after BM rather than standing up.       If plan is discharge home, recommend the following:  Assistance with cooking/housework;Help with stairs or ramp for entrance;Assist for transportation;A lot of help with walking and/or transfers;A lot of help with bathing/dressing/bathroom   Equipment Recommendations  Other (comment) (defer)    Recommendations for Other Services Rehab consult    Precautions / Restrictions Precautions Precautions: Fall Recall of Precautions/Restrictions: Intact Required Braces or Orthoses: Sling;Knee Immobilizer - Left Knee Immobilizer - Left: On at all times Restrictions Weight Bearing Restrictions Per Provider Order: Yes LUE Weight Bearing Per Provider Order: Non weight bearing LLE Weight Bearing Per Provider Order: Touchdown weight bearing Other Position/Activity Restrictions: per ortho note 12/12 okay for elbow distal ROM L UE       Mobility Bed Mobility Overal bed mobility: Needs Assistance Bed Mobility: Supine to Sit     Supine to sit: Min assist, HOB elevated, Used rails     General bed mobility comments: min assist to move LLE, reliant on rail and HOB  elevated to pivot to EOB with increased time    Transfers Overall transfer level: Needs assistance Equipment used: 1 person hand held assist Transfers: Bed to chair/wheelchair/BSC, Sit to/from Stand Sit to Stand: Mod assist   Squat pivot transfers: Min assist       General transfer comment: min assist to rise and pivot bed>BSC>WC>recliner. cues and assist for LLE postioning, equipment setup and assist to balance and rise.     Balance Overall balance assessment: Needs assistance Sitting-balance support: Feet supported, Single extremity supported, No upper extremity supported Sitting balance-Leahy Scale: Good Sitting balance - Comments: sitting without support                                   ADL either performed or assessed with clinical judgement   ADL Overall ADL's : Needs assistance/impaired         Upper Body Bathing: Minimal assistance;Sitting Upper Body Bathing Details (indicate cue type and reason): cues for appropriate compensatory techniques. pt needing repeated commands for carryover at times. Lower Body Bathing: Maximal assistance;Sit to/from stand Lower Body Bathing Details (indicate cue type and reason): pt able to wash RLE seated; needing A for foot and L foot. Mod A for rise and total A for bottom Upper Body Dressing : Moderate assistance;Sitting   Lower Body Dressing: Maximal assistance Lower Body Dressing Details (indicate cue type and reason): to don KI Toilet Transfer: Moderate assistance Toilet Transfer Details (indicate cue type and reason): STS                Extremity/Trunk Assessment Upper Extremity Assessment Upper Extremity Assessment: LUE  deficits/detail LUE Deficits / Details: WFL elbow distal ROM, educated on no shoulder ROM. LUE Coordination: decreased gross motor   Lower Extremity Assessment Lower Extremity Assessment: Defer to PT evaluation        Vision       Perception     Praxis     Communication  Communication Communication: No apparent difficulties   Cognition Arousal: Alert Behavior During Therapy: WFL for tasks assessed/performed Cognition: No apparent impairments             OT - Cognition Comments: although pt needing frequent cueing during bathing to keep L arm close to body                 Following commands: Intact        Cueing   Cueing Techniques: Verbal cues, Gestural cues  Exercises      Shoulder Instructions       General Comments      Pertinent Vitals/ Pain       Pain Assessment Pain Assessment: Faces Faces Pain Scale: Hurts even more Pain Location: Rt hip Pain Descriptors / Indicators: Aching, Guarding, Sore Pain Intervention(s): Limited activity within patient's tolerance, Monitored during session  Home Living                                          Prior Functioning/Environment              Frequency  Min 2X/week        Progress Toward Goals  OT Goals(current goals can now be found in the care plan section)  Progress towards OT goals: Progressing toward goals     Plan      Co-evaluation                 AM-PAC OT 6 Clicks Daily Activity     Outcome Measure   Help from another person eating meals?: A Little Help from another person taking care of personal grooming?: A Little Help from another person toileting, which includes using toliet, bedpan, or urinal?: Total Help from another person bathing (including washing, rinsing, drying)?: A Lot Help from another person to put on and taking off regular upper body clothing?: A Lot Help from another person to put on and taking off regular lower body clothing?: A Lot 6 Click Score: 13    End of Session Equipment Utilized During Treatment: Gait belt;Left knee immobilizer  OT Visit Diagnosis: Other abnormalities of gait and mobility (R26.89);Muscle weakness (generalized) (M62.81);Pain   Activity Tolerance Patient tolerated treatment well    Patient Left with call bell/phone within reach;with family/visitor present;in chair   Nurse Communication Mobility status;Precautions        Time: 8799-8767 OT Time Calculation (min): 32 min  Charges: OT General Charges $OT Visit: 1 Visit OT Treatments $Self Care/Home Management : 23-37 mins  Elma JONETTA Lebron FREDERICK, OTR/L [facility] Acute Rehabilitation Office: [phone]   Elma JONETTA Lebron 12/30/2023, 2:26 PM

## 2023-12-30 NOTE — Progress Notes (Signed)
 Inpatient Rehab Admissions Coordinator:   Notified by [facility] that appeal for CIR auth is complete and the denial has been upheld.  I will not be able to pursue CIR at this time.  I've notified TOC and will call pt to update.   Reche Lowers, PT, DPT Admissions Coordinator [phone] 12/30/2023 4:08 PM

## 2023-12-30 NOTE — Progress Notes (Signed)
 Inpatient Rehab Admissions Coordinator:   Awaiting insurance auth determination from [facility].  Reche Lowers, PT, DPT Admissions Coordinator [phone] 12/30/2023 11:34 AM

## 2023-12-31 MED ORDER — OXYMETAZOLINE HCL 0.05 % NA SOLN
1.0000 | Freq: Two times a day (BID) | NASAL | Status: AC
  Administered 2023-12-31 – 2024-01-02 (×2): 1 via NASAL
  Filled 2023-12-31: qty 30

## 2023-12-31 NOTE — Plan of Care (Signed)

## 2023-12-31 NOTE — Progress Notes (Signed)
 Physical Therapy Treatment Patient Details Name: Margaret English MRN: [id number] DOB: 31-Dec-1945 Today's Date: 12/31/2023   History of Present Illness 78 y.o. F adm 12/18/23 after a fall with Lt PTX, Lt 3-7 rib fx, Lt clavicle fx, T12 compression fx (chronic), multiple Lt pelvic fxs and Rt inferior pubic rami fxs, Lt tibial plateau fx. PMHx: breast CA s/p mastectomy, cataract, CKD, osteoporosis.    PT Comments  Pt pleasant and discussing SNF options with family on arrival due to CIR insurance denial. Pt with sore pelvis from continued increase in activity yet able to perform repeated transfers to Rt and LT, LB HEP and WC management this session. Education for all provided with pt and daughters. Patient will benefit from continued inpatient follow up therapy, <3 hours/day     If plan is discharge home, recommend the following: Assistance with cooking/housework;Assist for transportation;Help with stairs or ramp for entrance;A lot of help with walking and/or transfers;A lot of help with bathing/dressing/bathroom   Can travel by private vehicle     No  Equipment Recommendations  Wheelchair (measurements PT);Wheelchair cushion (measurements PT);BSC/3in1    Recommendations for Other Services       Precautions / Restrictions Precautions Precautions: Fall Recall of Precautions/Restrictions: Intact Required Braces or Orthoses: Sling;Knee Immobilizer - Left Knee Immobilizer - Left: On at all times Restrictions LUE Weight Bearing Per Provider Order: Non weight bearing LLE Weight Bearing Per Provider Order: Touchdown weight bearing     Mobility  Bed Mobility               General bed mobility comments: in chair on arrival and end of session    Transfers Overall transfer level: Needs assistance   Transfers: Sit to/from Stand, Bed to chair/wheelchair/BSC Sit to Stand: Min assist Stand pivot transfers: Min assist   Squat pivot transfers: Min assist     General transfer comment:  min assist to rise and pivot recliner<>WC x 2 trials. squat pivot toward Rt and stand pivot toward left, LLE on pillow to maintain TDWB, cues for sequence. Pt directing equipment setup with cues for brakes and safety    Ambulation/Gait               General Gait Details: not yet able   Dance Movement Psychotherapist Wheelchair propulsion: Right upper extremity, Right lower extremity Wheelchair parts: Supervision/cueing Distance: 100' Wheelchair Assistance Details (indicate cue type and reason): assist for brakes, leg rest positioning with pt able to steer out of room and down hall with supervision. return to room and maneuvering WC on return with max assist. Pt verbalizing sequence for brake use and leg rests   Tilt Bed    Modified Rankin (Stroke Patients Only)       Balance Overall balance assessment: Needs assistance Sitting-balance support: Feet supported, Single extremity supported, No upper extremity supported Sitting balance-Leahy Scale: Good     Standing balance support: Single extremity supported, During functional activity Standing balance-Leahy Scale: Poor Standing balance comment: physical assist in standing with RUE support                            Communication Communication Communication: Impaired Factors Affecting Communication: Hearing impaired  Cognition Arousal: Alert Behavior During Therapy: WFL for tasks assessed/performed   PT - Cognitive impairments: No apparent impairments  PT - Cognition Comments: pt able to state precautions and don/doff sling Following commands: Intact      Cueing Cueing Techniques: Verbal cues, Gestural cues  Exercises General Exercises - Lower Extremity Long Arc Quad: AROM, Right, Seated, Strengthening, 20 reps Straight Leg Raises: AAROM, Left, Seated, 10 reps, Strengthening Hip Flexion/Marching: AROM, Right, Seated, Strengthening, 20  reps    General Comments        Pertinent Vitals/Pain Pain Assessment Pain Assessment: 0-10 Pain Score: 4  Pain Location: pelvis and bil hips Pain Descriptors / Indicators: Aching, Guarding, Sore Pain Intervention(s): Limited activity within patient's tolerance, Monitored during session, Premedicated before session, Repositioned    Home Living                          Prior Function            PT Goals (current goals can now be found in the care plan section) Progress towards PT goals: Progressing toward goals    Frequency    Min 2X/week      PT Plan      Co-evaluation              AM-PAC PT 6 Clicks Mobility   Outcome Measure  Help needed turning from your back to your side while in a flat bed without using bedrails?: A Little Help needed moving from lying on your back to sitting on the side of a flat bed without using bedrails?: A Little Help needed moving to and from a bed to a chair (including a wheelchair)?: A Little Help needed standing up from a chair using your arms (e.g., wheelchair or bedside chair)?: A Little Help needed to walk in hospital room?: Total Help needed climbing 3-5 steps with a railing? : Total 6 Click Score: 14    End of Session Equipment Utilized During Treatment: Gait belt;Left knee immobilizer;Other (comment) (sling) Activity Tolerance: Patient tolerated treatment well Patient left: in chair;with family/visitor present Nurse Communication: Precautions;Mobility status PT Visit Diagnosis: Other abnormalities of gait and mobility (R26.89);Muscle weakness (generalized) (M62.81);Pain     Time: 8898-8870 PT Time Calculation (min) (ACUTE ONLY): 28 min  Charges:    $Therapeutic Exercise: 8-22 mins $Therapeutic Activity: 8-22 mins PT General Charges $$ ACUTE PT VISIT: 1 Visit                     Lenoard SQUIBB, PT Acute Rehabilitation Services Office: [phone]    Lenoard KATHEE Docker 12/31/2023, 12:27 PM

## 2023-12-31 NOTE — TOC Progression Note (Signed)
 Transition of Care [facility]) - Progression Note    Patient Details  Name: Margaret English MRN: [id number] Date of Birth: 1945/08/01  Transition [facility]) CM/SW Contact  Montie LOISE Louder, [place] Phone Number: 12/31/2023, 11:06 AM  Clinical Narrative:     CSW met with patient and family- provided SNF bed offers and medicare star ratings. Family requested to send referral to [facility], [facility]. CSW will follow up later today for choice.  TOC will continue to follow and assist with discharge planning.  Montie Louder, MSW, LCSW Clinical Social Worker    Expected Discharge Plan: IP Rehab Facility Barriers to Discharge: Continued Medical Work up               Expected Discharge Plan and Services   Discharge Planning Services: CM Consult                                           Social Drivers of Health (SDOH) Interventions SDOH Screenings   Food Insecurity: No Food Insecurity (12/19/2023)  Housing: Unknown (12/19/2023)  Transportation Needs: No Transportation Needs (12/19/2023)  Utilities: Not At Risk (12/19/2023)  Depression (PHQ2-9): Low Risk (12/04/2023)  Social Connections: Moderately Integrated (12/19/2023)  Tobacco Use: Low Risk (12/18/2023)    Readmission Risk Interventions     No data to display

## 2023-12-31 NOTE — Progress Notes (Signed)
 Patient ID: Margaret English, female   DOB: [birth year], 78 y.o.   MRN: [id number]       Subjective: No acute changes. L rib and pelvic pain well controlled. No sob. On RA this am. Tolerating po. No n/v. BM yesterday. Voiding. Working with therapies.   Objective: Vital signs in last 24 hours: Temp:  [97.6 F (36.4 C)-98.3 F (36.8 C)] 98.2 F (36.8 C) (12/23 0800) Pulse Rate:  [80-88] 80 (12/23 0800) Resp:  [16-18] 16 (12/23 0800) BP: (116-135)/(61-80) 127/61 (12/23 0800) SpO2:  [90 %-94 %] 90 % (12/23 0800) Last BM Date : 12/28/23  Intake/Output from previous day: 12/22 0701 - 12/23 0700 In: 240 [P.O.:240] Out: -  Intake/Output this shift: No intake/output data recorded.  PE: Gen: NAD, lying in bed Heart: regular Lungs: CTA, respiratory effort nonlabored, on RA Abd: soft, ND, NT Ext: MAE's, KI and ace on LLE.  Psych: A&Ox3  Lab Results: CBC  No results for input(s): WBC, HGB, HCT, PLT in the last 72 hours. BMET No results for input(s): NA, K, CL, CO2, GLUCOSE, BUN, CREATININE, CALCIUM  in the last 72 hours. PT/INR No results for input(s): LABPROT, INR in the last 72 hours. ABG No results for input(s): PHART, HCO3 in the last 72 hours.  Invalid input(s): PCO2, PO2  Studies/Results: No results found.  Anti-infectives: Anti-infectives (From admission, onward)    None       Assessment/Plan: Mechanical fall (garage steps) Left 3-7 rib fxs with trace PTX - multimodal pain control, pulm toilet, IS Left clavicle fx - sling, non-op mgmt per Dr. Kendal, NWB Chronic T12 compression fx - pt reports this is from 3-4 years ago Left pubic rami fxs with extension into acetabulum - Per Ortho, Dr. Kendal, TDWB LLE Left pubic bone fx - Per Ortho, Dr. Kendal, TDWB LLE Right inferior pubic ramus fx - per ortho Dr. Kendal Left tibial plateau fx with moderate lipohemarthrosis - Per Ortho, Dr. Kendal, TDWB LLE in [name]. Patient would like to shower,  ok to remove knee immobilizer for showering per ortho notes. Hx of breast CA s/p bilateral mastectomy and chemo  Chemo related neuropathy  Osteoporosis- resume home vitD Acute stress reaction/anxiety - psych consult, buspar  and atarax  FEN: reg diet, continue bowel regimen VTE: SCD, LMWH ID: no current indication for abx Foley: None, spont void Dispo: PT/OT - CIR denied by insurance - appeal denied. Medically stable for discharge. TOC working on SNF.    LOS: 13 days    Margaret Dawn, MD [facility] Surgery General, Hepatobiliary and Pancreatic Surgery 12/31/2023 9:29 AM

## 2023-12-31 NOTE — TOC Progression Note (Signed)
 Transition of Care [facility]) - Progression Note    Patient Details  Name: FRANCHESKA VILLEDA MRN: [id number] Date of Birth: February 27, 1945  Transition of Care [facility]) CM/SW Contact  Montie LOISE Louder, [place] Phone Number: 12/31/2023, 3:53 PM  Clinical Narrative:     Patient and family is interested in [facility].   CSW spoke with Donny at [facility]- SNF is trying to confirm if insurance plan is HMO or note. SNF is unable to accept if it's [facility] HMO. Waiting on call back from SNF.  Montie Louder, MSW, LCSW Clinical Social Worker    Expected Discharge Plan: IP Rehab Facility Barriers to Discharge: Continued Medical Work up               Expected Discharge Plan and Services   Discharge Planning Services: CM Consult                                           Social Drivers of Health (SDOH) Interventions SDOH Screenings   Food Insecurity: No Food Insecurity (12/19/2023)  Housing: Unknown (12/19/2023)  Transportation Needs: No Transportation Needs (12/19/2023)  Utilities: Not At Risk (12/19/2023)  Depression (PHQ2-9): Low Risk (12/04/2023)  Social Connections: Moderately Integrated (12/19/2023)  Tobacco Use: Low Risk (12/18/2023)    Readmission Risk Interventions     No data to display

## 2024-01-01 NOTE — TOC Progression Note (Addendum)
 Transition of Care [facility]) - Progression Note    Patient Details  Name: Margaret English MRN: [id number] Date of Birth: 09-28-1945  Transition of Care [facility]) CM/SW Contact  Lendia Dais, [place] Phone Number: 01/01/2024, 1:39 PM  Clinical Narrative: CSW received a message from [facility] who stated they had a private room available and that room and board would be private pay and STR would be covered through the pt's insurance.   CSW spoke to pt via phone and informed them of the info above. Pt had questions regarding what insurance would cover. CSW stated they could have [facility] give them a call. Pt's preferences are [facility] and [facility].  1402 - Pt chose [facility], CSW message [facility] to confirm bed availability, response pending. Pt will need insurance auth once bed availability is confirmed.   CSW will continue to follow.    Expected Discharge Plan: IP Rehab Facility Barriers to Discharge: Continued Medical Work up               Expected Discharge Plan and Services   Discharge Planning Services: CM Consult                                           Social Drivers of Health (SDOH) Interventions SDOH Screenings   Food Insecurity: No Food Insecurity (12/19/2023)  Housing: Unknown (12/19/2023)  Transportation Needs: No Transportation Needs (12/19/2023)  Utilities: Not At Risk (12/19/2023)  Depression (PHQ2-9): Low Risk (12/04/2023)  Social Connections: Moderately Integrated (12/19/2023)  Tobacco Use: Low Risk (12/18/2023)    Readmission Risk Interventions     No data to display

## 2024-01-01 NOTE — Progress Notes (Signed)
 Patient ID: Margaret English, female   DOB: 17-Jul-1945, 78 y.o.   MRN: [id number]       Subjective: No acute changes. L rib and pelvic pain well controlled. No sob. On RA this am. Tolerating po. No n/v. No BM yesterday. Voiding. Working with therapies.   Objective: Vital signs in last 24 hours: Temp:  [98.2 F (36.8 C)-98.7 F (37.1 C)] 98.2 F (36.8 C) (12/24 0746) Pulse Rate:  [81-90] 82 (12/24 0746) Resp:  [16-18] 18 (12/24 0746) BP: (109-133)/(61-71) 133/71 (12/24 0746) SpO2:  [91 %-94 %] 94 % (12/24 0746) Last BM Date : 12/28/23  Intake/Output from previous day: No intake/output data recorded. Intake/Output this shift: No intake/output data recorded.  PE: Gen: NAD, lying in bed Heart: regular Lungs: CTA, respiratory effort nonlabored, on RA Abd: soft, ND, NT Ext: MAE's, No RLE edema, KI and ace on LLE and wwp Psych: A&Ox3  Lab Results: CBC  No results for input(s): WBC, HGB, HCT, PLT in the last 72 hours. BMET No results for input(s): NA, K, CL, CO2, GLUCOSE, BUN, CREATININE, CALCIUM  in the last 72 hours. PT/INR No results for input(s): LABPROT, INR in the last 72 hours. ABG No results for input(s): PHART, HCO3 in the last 72 hours.  Invalid input(s): PCO2, PO2  Studies/Results: No results found.  Anti-infectives: Anti-infectives (From admission, onward)    None       Assessment/Plan: Mechanical fall (garage steps) Left 3-7 rib fxs with trace PTX - multimodal pain control, pulm toilet, IS Left clavicle fx - sling, non-op mgmt per Dr. Kendal, NWB Chronic T12 compression fx - pt reports this is from 3-4 years ago Left pubic rami fxs with extension into acetabulum - Per Ortho, Dr. Kendal, TDWB LLE Left pubic bone fx - Per Ortho, Dr. Kendal, TDWB LLE Right inferior pubic ramus fx - per ortho Dr. Kendal Left tibial plateau fx with moderate lipohemarthrosis - Per Ortho, Dr. Kendal, TDWB LLE in [name]. Patient would like to shower,  ok to remove knee immobilizer for showering per ortho notes. Hx of breast CA s/p bilateral mastectomy and chemo  Chemo related neuropathy  Osteoporosis- resume home vitD Acute stress reaction/anxiety - psych consult, buspar  and atarax  FEN: reg diet, continue bowel regimen VTE: SCD, LMWH ID: no current indication for abx Foley: None, spont void Dispo: PT/OT - CIR denied by insurance - appeal denied. Medically stable for discharge. TOC working on SNF.    LOS: 14 days    Margaret Dawn, MD [facility] Surgery General, Hepatobiliary and Pancreatic Surgery 01/01/2024 9:24 AM

## 2024-01-01 NOTE — Plan of Care (Signed)
  Problem: Education: Goal: Knowledge of General Education information will improve Description: Including pain rating scale, medication(s)/side effects and non-pharmacologic comfort measures Outcome: Progressing   Problem: Health Behavior/Discharge Planning: Goal: Ability to manage health-related needs will improve Outcome: Progressing   Problem: Clinical Measurements: Goal: Ability to maintain clinical measurements within normal limits will improve Outcome: Progressing Goal: Will remain free from infection Outcome: Progressing Goal: Diagnostic test results will improve Outcome: Progressing Goal: Respiratory complications will improve Outcome: Progressing Goal: Cardiovascular complication will be avoided Outcome: Progressing   Problem: Activity: Goal: Risk for activity intolerance will decrease Outcome: Progressing   Problem: Nutrition: Goal: Adequate nutrition will be maintained Outcome: Progressing   Problem: Coping: Goal: Level of anxiety will decrease Outcome: Progressing   Problem: Elimination: Goal: Will not experience complications related to bowel motility Outcome: Progressing Goal: Will not experience complications related to urinary retention Outcome: Progressing   Problem: Pain Managment: Goal: General experience of comfort will improve and/or be controlled Outcome: Progressing   Problem: Safety: Goal: Ability to remain free from injury will improve Outcome: Progressing   Problem: Skin Integrity: Goal: Risk for impaired skin integrity will decrease Outcome: Progressing   Problem: Education: Goal: Required Educational Video(s) Outcome: Progressing   Problem: Clinical Measurements: Goal: Ability to maintain clinical measurements within normal limits will improve Outcome: Progressing Goal: Postoperative complications will be avoided or minimized Outcome: Progressing   Problem: Skin Integrity: Goal: Demonstration of wound healing without infection  will improve Outcome: Progressing

## 2024-01-01 NOTE — TOC Progression Note (Signed)
 Transition of Care [facility]) - Progression Note    Patient Details  Name: Margaret English MRN: [id number] Date of Birth: 12-01-1945  Transition of Care [facility]) CM/SW Contact  Montie LOISE Louder, [place] Phone Number: 01/01/2024, 8:38 AM  Clinical Narrative:     Received text message back from [facility] admission confirming patient's insurance is out of network due to having the HMO plan. Updated patient, she inquired about private pay- sent message to [facility], waiting on response.    Expected Discharge Plan: IP Rehab Facility Barriers to Discharge: Continued Medical Work up               Expected Discharge Plan and Services   Discharge Planning Services: CM Consult                                           Social Drivers of Health (SDOH) Interventions SDOH Screenings   Food Insecurity: No Food Insecurity (12/19/2023)  Housing: Unknown (12/19/2023)  Transportation Needs: No Transportation Needs (12/19/2023)  Utilities: Not At Risk (12/19/2023)  Depression (PHQ2-9): Low Risk (12/04/2023)  Social Connections: Moderately Integrated (12/19/2023)  Tobacco Use: Low Risk (12/18/2023)    Readmission Risk Interventions     No data to display

## 2024-01-01 NOTE — Plan of Care (Signed)
   Problem: Education: Goal: Knowledge of General Education information will improve Description Including pain rating scale, medication(s)/side effects and non-pharmacologic comfort measures Outcome: Progressing   Problem: Health Behavior/Discharge Planning: Goal: Ability to manage health-related needs will improve Outcome: Progressing

## 2024-01-02 NOTE — Progress Notes (Addendum)
 Patient ID: Margaret English, female   DOB: July 16, 1945, 78 y.o.   MRN: [id number]       Subjective: No acute changes. L rib and pelvic pain well controlled. No sob. On RA this am. Tolerating po. No n/v. BM today. Voiding.   Objective: Vital signs in last 24 hours: Temp:  [97.9 F (36.6 C)-98.6 F (37 C)] 97.9 F (36.6 C) (12/25 0751) Pulse Rate:  [89-96] 89 (12/25 0751) Resp:  [17-20] 18 (12/25 0751) BP: (113-133)/(57-73) 122/67 (12/25 0751) SpO2:  [94 %-98 %] 95 % (12/25 0751) Last BM Date : 12/28/23  Intake/Output from previous day: No intake/output data recorded. Intake/Output this shift: No intake/output data recorded.  PE: Gen: NAD, lying in bed Heart: regular Lungs: CTA, respiratory effort nonlabored, on RA Abd: soft, ND, NT Ext: MAE's, No RLE edema, KI and ace on LLE and wwp Psych: A&Ox3  Lab Results: CBC  No results for input(s): WBC, HGB, HCT, PLT in the last 72 hours. BMET No results for input(s): NA, K, CL, CO2, GLUCOSE, BUN, CREATININE, CALCIUM  in the last 72 hours. PT/INR No results for input(s): LABPROT, INR in the last 72 hours. ABG No results for input(s): PHART, HCO3 in the last 72 hours.  Invalid input(s): PCO2, PO2  Studies/Results: No results found.  Anti-infectives: Anti-infectives (From admission, onward)    None       Assessment/Plan: Mechanical fall (garage steps) Left 3-7 rib fxs with trace PTX - multimodal pain control, pulm toilet, IS Left clavicle fx - sling, non-op mgmt per Dr. Kendal, NWB Chronic T12 compression fx - pt reports this is from 3-4 years ago Left pubic rami fxs with extension into acetabulum - Per Ortho, Dr. Kendal, TDWB LLE Left pubic bone fx - Per Ortho, Dr. Kendal, TDWB LLE Right inferior pubic ramus fx - per ortho Dr. Kendal Left tibial plateau fx with moderate lipohemarthrosis - Per Ortho, Dr. Kendal, TDWB LLE in Margaret English. Patient would like to shower, ok to remove knee immobilizer  for showering per ortho notes. Hx of breast CA s/p bilateral mastectomy and chemo  Chemo related neuropathy  Osteoporosis- resume home vitD Acute stress reaction/anxiety - psych consult, buspar  and atarax  FEN: reg diet, continue bowel regimen VTE: SCD, LMWH ID: no current indication for abx Foley: None, spont void Dispo: PT/OT - CIR denied by insurance - appeal denied. Medically stable for discharge. TOC working on SNF. Can move to med-surg if needed   LOS: 15 days   Margaret English , [facility] Surgery 01/02/2024, 9:27 AM Please see Amion for pager number during day hours 7:00am-4:30pm

## 2024-01-02 NOTE — Plan of Care (Signed)

## 2024-01-02 NOTE — Plan of Care (Signed)

## 2024-01-02 NOTE — TOC Progression Note (Signed)
 Transition of Care [facility]) - Progression Note    Patient Details  Name: Margaret English MRN: [id number] Date of Birth: 1945/07/09  Transition of Care [facility]) CM/SW Contact  Inocente GORMAN Kindle, LCSW Phone Number: 01/02/2024, 10:23 AM  Clinical Narrative:    [facility] staff are not in today so unable to confirm bed availability but started insurance process in the event they can accept tomorrow and received authorization, Ref# [id number], effective 01/03/2024-01/07/2024.   Expected Discharge Plan: IP Rehab Facility Barriers to Discharge: Continued Medical Work up               Expected Discharge Plan and Services   Discharge Planning Services: CM Consult                                           Social Drivers of Health (SDOH) Interventions SDOH Screenings   Food Insecurity: No Food Insecurity (12/19/2023)  Housing: Unknown (12/19/2023)  Transportation Needs: No Transportation Needs (12/19/2023)  Utilities: Not At Risk (12/19/2023)  Depression (PHQ2-9): Low Risk (12/04/2023)  Social Connections: Moderately Integrated (12/19/2023)  Tobacco Use: Low Risk (12/18/2023)    Readmission Risk Interventions     No data to display

## 2024-01-03 MED ORDER — GABAPENTIN 300 MG PO CAPS: 300.0000 mg | ORAL_CAPSULE | Freq: Three times a day (TID) | ORAL | Status: AC

## 2024-01-03 MED ORDER — METHOCARBAMOL 750 MG PO TABS: 750.0000 mg | ORAL_TABLET | Freq: Four times a day (QID) | ORAL | Status: AC

## 2024-01-03 MED ORDER — HYDROXYZINE HCL 25 MG PO TABS: 25.0000 mg | ORAL_TABLET | Freq: Three times a day (TID) | ORAL | Status: AC | PRN

## 2024-01-03 MED ORDER — OXYCODONE HCL 5 MG PO TABS: 5.0000 mg | ORAL_TABLET | ORAL | 0 refills | Status: AC | PRN

## 2024-01-03 MED ORDER — POLYETHYLENE GLYCOL 3350 17 G PO PACK: 17.0000 g | PACK | Freq: Two times a day (BID) | ORAL | Status: DC | PRN

## 2024-01-03 MED ORDER — DOCUSATE SODIUM 100 MG PO CAPS: 100.0000 mg | ORAL_CAPSULE | Freq: Two times a day (BID) | ORAL | Status: AC

## 2024-01-03 MED ORDER — BUSPIRONE HCL 10 MG PO TABS: 10.0000 mg | ORAL_TABLET | Freq: Two times a day (BID) | ORAL | Status: AC

## 2024-01-03 NOTE — Discharge Summary (Signed)
 "   Patient ID: Margaret English [id number] March 31, 1945 78 y.o.  Admit date: 12/18/2023 Discharge date: 01/03/2024  Admitting Diagnosis: Margaret English is an 78 y.o. female s/p mechanical fall on 12/18/23 - Trace left pneumothorax - Left 3-7 rib fx - Left clavicle fracture - T12 compression fracture, age indeterminate - Nondisplaced left superior and inferior pubic rami fractures with extension into anterior left acetabulum - Nondisplaced left pubic bone fracture - Nondisplaced right inferior pubic rami fracture  Discharge Diagnosis Mechanical fall (garage steps) Left 3-7 rib fxs with trace PTX Left clavicle fx  Chronic T12 compression fx Left pubic rami fxs with extension into acetabulum Left pubic bone fx  Right inferior pubic ramus fx Left tibial plateau fx with moderate lipohemarthrosis  Hx of breast CA s/p bilateral mastectomy and chemo  Chemo related neuropathy  Osteoporosis Acute stress reaction/anxiety   Consultants Ortho Psych PMR  HPI: Margaret English is an 78 y.o. female who presents to [facility] as transfer from [facility] after a mechanical fall.   Patient was at home earlier today when she tripped over a pair of shoes and fell. She presented with left shoulder pain, left hip pain and left leg pain. She was found to have multiple orthopaedic injuries as well as rib fractures and trace left pneumothorax.   No abdominal pain, complains of left sided chest, hip and leg pain.  Procedures None  Hospital Course:  Patient presented as above after a fall and was found to have below injuries.   Left 3-7 rib fxs with trace PTX - repeat cxr with no PTX. Treated with multimodal pain control, pulm toilet, IS  Left clavicle fx - Per Ortho, Dr. Kendal, sling, non-op mgmt, NWB  Chronic T12 compression fx - pt reports this is from 3-4 years ago  Left pubic rami fxs with extension into acetabulum - Per Ortho, Dr. Kendal, TDWB LLE  Left pubic bone fx - Per Ortho, Dr. Kendal, TDWB  LLE  Right inferior pubic ramus fx - per ortho Dr. Kendal  Left tibial plateau fx with moderate lipohemarthrosis - Per Ortho, Dr. Kendal, TDWB LLE in [name].   Hx of breast CA s/p bilateral mastectomy and chemo   Chemo related neuropathy   Osteoporosis- resumed home vitD  Acute stress reaction/anxiety - psych consult, buspar  and atarax   [facility] worked with [facility] and was recommended for CIR vs SNF. Denied by insurance for [employer] - appeal denied. On 12/26 the patient was voiding well, tolerating diet, working well with therapies, pain well controlled, vital signs stable and felt stable for discharge to SNF. Follow up as recommended below.  Physical Exam: Gen: NAD, lying in bed Heart: regular Lungs: CTA, respiratory effort nonlabored, on RA Abd: soft, ND, NT Ext: MAE's, No RLE edema, KI and ace on LLE and wwp Psych: A&Ox3   Allergies as of 01/03/2024       Reactions   Sulfa Antibiotics Other (See Comments)   Drainage and red eyes only allergic to sulfa eye drops   Sulfonamide Derivatives Other (See Comments)   Eye drops only   Latex Itching, Dermatitis   Latex bandages   Cvs Astringent Eye Drops [tetrahydrozoline-zn Sulfate] Other (See Comments)        Medication List     PAUSE taking these medications    Xanax  0.5 MG tablet Wait to take this until your doctor or other care provider tells you to start again. Generic drug: ALPRAZolam  Take 0.5 mg by mouth daily as needed. Uses  when flying or occasional sleep.       TAKE these medications    acetaminophen  500 MG tablet Commonly known as: TYLENOL  Take 500 mg by mouth every 6 (six) hours as needed.   acyclovir 200 MG capsule Commonly known as: ZOVIRAX Take 200-600 mg by mouth See admin instructions. 400 mg in the morning and 600 in the evening if she has a break out   aspirin EC 81 MG tablet Take 162 mg by mouth at bedtime. Swallow whole.   busPIRone  10 MG tablet Commonly known as: BUSPAR  Take 1 tablet (10 mg  total) by mouth 2 (two) times daily.   CALCIUM  SOFT CHEWS PO Take 750 mg by mouth daily as needed (bone).   celecoxib 200 MG capsule Commonly known as: CELEBREX Take 200 mg by mouth as needed.   docusate sodium  100 MG capsule Commonly known as: COLACE Take 1 capsule (100 mg total) by mouth 2 (two) times daily.   Elderberry 500 MG Caps Take 1 capsule by mouth daily.   fluticasone 50 MCG/ACT nasal spray Commonly known as: FLONASE Place 2 sprays into both nostrils as needed for allergies or rhinitis.   gabapentin  300 MG capsule Commonly known as: NEURONTIN  Take 1 capsule (300 mg total) by mouth 3 (three) times daily.   hydrOXYzine  25 MG tablet Commonly known as: ATARAX  Take 1 tablet (25 mg total) by mouth 3 (three) times daily as needed for anxiety.   Magnesium  250 MG Tabs Take 1 tablet by mouth 3 (three) times a week.   methocarbamol  750 MG tablet Commonly known as: ROBAXIN  Take 1 tablet (750 mg total) by mouth 4 (four) times daily.   OVER THE COUNTER MEDICATION Take 1 tablet by mouth daily. WEEM supplement   oxyCODONE  5 MG immediate release tablet Commonly known as: Oxy IR/ROXICODONE  Take 1-1.5 tablets (5-7.5 mg total) by mouth every 4 (four) hours as needed for moderate pain (pain score 4-6) or severe pain (pain score 7-10).   polyethylene glycol 17 g packet Commonly known as: MIRALAX  / GLYCOLAX  Take 17 g by mouth 2 (two) times daily as needed for mild constipation.   vitamin D3 50 MCG (2000 UT) Caps Take by mouth 2 (two) times daily.          Contact information for follow-up providers     Aisha Harvey, MD Follow up.   Specialty: Family Medicine Contact information: [address] [place] [address] [phone]         Kendal Franky SQUIBB, MD. Schedule an appointment as soon as possible for a visit in 1 week(s).   Specialty: Orthopedic Surgery Contact information: [address] [place] [address] [phone]               Contact information for after-discharge care     Destination     [facility] [place] .   Service: Skilled Nursing Contact information: [address] [place]  [address] [phone]                     Signed: Ozell CHRISTELLA Shaper, [facility] Surgery 01/03/2024, 11:12 AM Please see Amion for pager number during day hours 7:00am-4:30pm  "

## 2024-01-03 NOTE — Progress Notes (Signed)
 Order to discharge patient to [facility] . Family at bedside. Discharge instructions/AVS given to and reviewed with Tonya RN. Education provided as needed . Patient verbalized understanding. 1 PIV removed by the RN.Personal belongings sent home with the patient. SNF via [facility]

## 2024-01-03 NOTE — Progress Notes (Signed)
 Physical Therapy Treatment Patient Details Name: Margaret English MRN: [id number] DOB: 08/06/1945 Today's Date: 01/03/2024   History of Present Illness 77 y.o. F adm 12/18/23 after a fall with Lt PTX, Lt 3-7 rib fx, Lt clavicle fx, T12 compression fx (chronic), multiple Lt pelvic fxs and Rt inferior pubic rami fxs, Lt tibial plateau fx. PMHx: breast CA s/p mastectomy, cataract, CKD, osteoporosis.    PT Comments  Patient eager to work with PT. Focus on session on strengthening and transfer training. Patient continues to need cues for sequencing and min assist for balance. Will benefit from post-acute inpatient therapies <3 hrs/day.     If plan is discharge home, recommend the following: Assistance with cooking/housework;Assist for transportation;Help with stairs or ramp for entrance;A lot of help with walking and/or transfers;A lot of help with bathing/dressing/bathroom   Can travel by private vehicle        Equipment Recommendations  Wheelchair (measurements PT);Wheelchair cushion (measurements PT);BSC/3in1    Recommendations for Other Services       Precautions / Restrictions Precautions Precautions: Fall Recall of Precautions/Restrictions: Intact Required Braces or Orthoses: Sling;Knee Immobilizer - Left Restrictions Weight Bearing Restrictions Per Provider Order: Yes LUE Weight Bearing Per Provider Order: Non weight bearing LLE Weight Bearing Per Provider Order: Non weight bearing Other Position/Activity Restrictions: per ortho note 12/12 okay for elbow distal ROM L UE     Mobility  Bed Mobility Overal bed mobility: Needs Assistance Bed Mobility: Supine to Sit, Sit to Supine     Supine to sit: Min assist, HOB elevated, Used rails Sit to supine: Min assist, HOB elevated   General bed mobility comments: from bed to recliner and back to bed    Transfers Overall transfer level: Needs assistance Equipment used: 1 person hand held assist Transfers: Bed to  chair/wheelchair/BSC   Stand pivot transfers: Min assist         General transfer comment: min assist to rise and pivot from bed to recliner on her left; LLE on pillow to maintain TDWB, cues for sequence. Stand-pivot to rt back to bed pt reaching from armrest of recliner to bedrail    Ambulation/Gait               General Gait Details: not yet able   Stairs             Wheelchair Mobility     Tilt Bed    Modified Rankin (Stroke Patients Only)       Balance Overall balance assessment: Needs assistance Sitting-balance support: No upper extremity supported, Feet supported Sitting balance-Leahy Scale: Good     Standing balance support: Single extremity supported, During functional activity Standing balance-Leahy Scale: Poor Standing balance comment: physical assist in standing with RUE support                            Communication Communication Communication: Impaired Factors Affecting Communication: Hearing impaired  Cognition Arousal: Alert Behavior During Therapy: WFL for tasks assessed/performed   PT - Cognitive impairments: No apparent impairments                       PT - Cognition Comments: pt able to state precautions and don/doff sling Following commands: Intact      Cueing Cueing Techniques: Verbal cues, Gestural cues  Exercises General Exercises - Lower Extremity Long Arc Quad: AROM, Right, Seated, Strengthening, 10 reps (manually resisted ext and flex) Hip ABduction/ADduction: AROM,  Left, 10 reps, Seated Straight Leg Raises: AAROM, Left, Seated, Strengthening, 5 reps Hip Flexion/Marching: AROM, Right, Seated, Strengthening, 20 reps Other Exercises Other Exercises: chair push-ups with RUE and RLE (up to clear buttocks and return to sitting) x 10    General Comments        Pertinent Vitals/Pain Pain Assessment Pain Assessment: Faces Faces Pain Scale: Hurts little more Pain Location: L hip>rt Pain  Descriptors / Indicators: Aching, Guarding, Sore Pain Intervention(s): Limited activity within patient's tolerance, Monitored during session, Premedicated before session    Home Living                          Prior Function            PT Goals (current goals can now be found in the care plan section) Acute Rehab PT Goals Patient Stated Goal: to return to prior level of function, improve transfer quality Progress towards PT goals: Progressing toward goals    Frequency    Min 2X/week      PT Plan      Co-evaluation              AM-PAC PT 6 Clicks Mobility   Outcome Measure  Help needed turning from your back to your side while in a flat bed without using bedrails?: A Little Help needed moving from lying on your back to sitting on the side of a flat bed without using bedrails?: A Little Help needed moving to and from a bed to a chair (including a wheelchair)?: A Little Help needed standing up from a chair using your arms (e.g., wheelchair or bedside chair)?: A Little Help needed to walk in hospital room?: Total Help needed climbing 3-5 steps with a railing? : Total 6 Click Score: 14    End of Session Equipment Utilized During Treatment: Gait belt;Left knee immobilizer;Other (comment) (sling) Activity Tolerance: Patient tolerated treatment well Patient left: with family/visitor present;in bed;with call bell/phone within reach   PT Visit Diagnosis: Other abnormalities of gait and mobility (R26.89);Muscle weakness (generalized) (M62.81);Pain     Time: 8991-8971 PT Time Calculation (min) (ACUTE ONLY): 20 min  Charges:    $Therapeutic Exercise: 8-22 mins PT General Charges $$ ACUTE PT VISIT: 1 Visit                      Margaret English, PT Acute Rehabilitation Services  Office [phone]    Margaret English 01/03/2024, 10:38 AM

## 2024-01-03 NOTE — TOC Transition Note (Signed)
 Transition of Care [facility]) - Discharge Note   Patient Details  Name: Margaret English MRN: [id number] Date of Birth: March 19, 1945  Transition of Care [facility]) CM/SW Contact:  Ayodeji Keimig E Keiva Dina, LCSW Phone Number: 01/03/2024, 11:45 AM   Clinical Narrative:    Discharge to [facility] today. Room 105P. Confirmed with Admissions Worker [place]. Updated MD, RN, and patient. Asked RN to call report. EMS paperwork completed. Called [facility] at 11:45, they state ETA is within 1 hour. RN notified.    Final next level of care: Skilled Nursing Facility Barriers to Discharge: Barriers Resolved   Patient Goals and CMS Choice Patient states their goals for this hospitalization and ongoing recovery are:: STR CMS Medicare.gov Compare Post Acute Care list provided to:: Patient Choice offered to / list presented to : Patient      Discharge Placement              Patient chooses bed at: [facility] Patient to be transferred to facility by: [facility] Name of family member notified: patient - family is at bedside Patient and family notified of of transfer: 01/03/24  Discharge Plan and Services Additional resources added to the After Visit Summary for     Discharge Planning Services: CM Consult                                 Social Drivers of Health (SDOH) Interventions SDOH Screenings   Food Insecurity: No Food Insecurity (12/19/2023)  Housing: Unknown (12/19/2023)  Transportation Needs: No Transportation Needs (12/19/2023)  Utilities: Not At Risk (12/19/2023)  Depression (PHQ2-9): Low Risk (12/04/2023)  Social Connections: Moderately Integrated (12/19/2023)  Tobacco Use: Low Risk (12/18/2023)     Readmission Risk Interventions     No data to display

## 2024-01-03 NOTE — TOC Progression Note (Signed)
 Transition of Care [facility]) - Progression Note    Patient Details  Name: Margaret English MRN: [id number] Date of Birth: [birth year]  Transition of Care [facility]) CM/SW Contact  Yezenia Fredrick E Barbara Ahart, LCSW Phone Number: 01/03/2024, 9:10 AM  Clinical Narrative:    Spoke with [facility] at [facility] - she confirms they have a bed today if patient is medically ready - notified Providers.   Expected Discharge Plan: IP Rehab Facility Barriers to Discharge: Continued Medical Work up               Expected Discharge Plan and Services   Discharge Planning Services: CM Consult                                           Social Drivers of Health (SDOH) Interventions SDOH Screenings   Food Insecurity: No Food Insecurity (12/19/2023)  Housing: Unknown (12/19/2023)  Transportation Needs: No Transportation Needs (12/19/2023)  Utilities: Not At Risk (12/19/2023)  Depression (PHQ2-9): Low Risk (12/04/2023)  Social Connections: Moderately Integrated (12/19/2023)  Tobacco Use: Low Risk (12/18/2023)    Readmission Risk Interventions     No data to display

## 2024-02-06 ENCOUNTER — Other Ambulatory Visit (HOSPITAL_BASED_OUTPATIENT_CLINIC_OR_DEPARTMENT_OTHER): Payer: Self-pay | Admitting: Student

## 2024-02-06 ENCOUNTER — Ambulatory Visit (HOSPITAL_BASED_OUTPATIENT_CLINIC_OR_DEPARTMENT_OTHER)
Admission: RE | Admit: 2024-02-06 | Discharge: 2024-02-06 | Disposition: A | Discharge status: Home / Self Care | Source: Ambulatory Visit | Attending: Student | Admitting: Student

## 2024-02-06 DIAGNOSIS — R2242 Localized swelling, mass and lump, left lower limb: Secondary | ICD-10-CM | POA: Insufficient documentation

## 2024-02-13 NOTE — Progress Notes (Unsigned)
 " DVT Clinic Note  Name: Margaret English     MRN: [id number]     DOB: 26-Nov-1945     Sex: female  PCP: Aisha Harvey, MD  Today's Visit: Visit Information: Initial Visit  Referred to DVT Clinic by: Orthopedic Surgery - Dr. Franky Haddix Referred to CPP by: Dr. Pearline Reason for referral:  Chief Complaint  Patient presents with   DVT   HISTORY OF PRESENT ILLNESS: Margaret English is a 79 y.o. female with PMH breast cancer in remission, pulmonary embolism, osteoporosis who presents after diagnosis of DVT for medication management. Patient was admitted to [facility] 12/18/23 to 01/03/24 for injuries related to a mechanical fall. She was found to have multiple rib fractures, left clavicle fracture, pubic rami fractures, and left tibial plateau fracture. Her injuries were managed nonoperatively and she was discharged to SNF for rehab. On 02/06/24 she completed a venous ultrasound to further evaluate left leg pain and swelling. Ultrasound was positive for DVT in the left femoral and popliteal veins. There was limited evaluation of the calf veins. She was started on treatment with Eliquis and referred to DVT Clinic for follow up.  Patient presents to clinic using a walker accompanied by her husband. Denies abnormal bleeding or bruising. Denies missed doses of Eliquis. Completed initial VTE dosing of 10 mg BID yesterday. Not currently wearing compression stockings, but elevates her leg to help with swelling. Reports that following fracture her leg was in an immobilizer for 5 weeks. This was removed on 01/14/2024, but she was non weight bearing on her left leg for 2 weeks after that. Also prior to starting Eliquis reports taking aspirin 162 mg daily due to history of blood clot as recommended by Dr. Timmy. While hospitalized she received Lovenox  for prophylaxis and then when discharged to the SNF aspirin was not resumed for several days.   Positive Thrombotic Risk Factors: Previous VTE, Recent trauma (within 3 months),  Recent admission to hospital with acute illness (within 3 months), Paralysis, paresis, or recent plaster cast immobilization of lower extremity, Older Age Bleeding Risk Factors: Age >65 years  Negative Thrombotic Risk Factors: Recent surgery (within 3 months), Central venous catheterization, Sedentary journey lasting >8 hours within 4 weeks, Bed rest >72 hours within 3 months, Pregnancy, Estrogen therapy, Recent cesarean section (within 3 months), Within 6 weeks postpartum, Testosterone therapy, Erythropoiesis-stimulating agent, Recent COVID diagnosis (within 3 months), Known thrombophilic condition, Smoking, Non-malignant, chronic inflammatory condition, Active cancer, Obesity  Rx Insurance Coverage: Medicare Rx Affordability: Patient reports Eliquis is ~$150 which is affordable for her. Rx Assistance Provided: None needed Preferred Pharmacy: PCP already prescribed Eliquis refills.  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   Osteopenia    Osteoporosis     Past Surgical History:  Procedure Laterality Date   ABDOMINAL HYSTERECTOMY     COLONOSCOPY  03/16/2016   Dr.Perry   EYE SURGERY Bilateral    2016- Cataracts   MASTECTOMY Bilateral    2009   POLYPECTOMY      Social History   Socioeconomic History   Marital status: Married    Spouse name: Not on file   Number of children: Not on file   Years of education: Not on file  Highest education level: Not on file  Occupational History   Not on file  Tobacco Use   Smoking status: Never   Smokeless tobacco: Never   Tobacco comments:    NEVER USED TOBACCO  Vaping Use   Vaping status: Never Used  Substance and Sexual Activity   Alcohol use: Yes    Alcohol/week: 0.0 standard drinks of alcohol    Comment: OCCASSIONAL   Drug use: No   Sexual  activity: Not Currently  Other Topics Concern   Not on file  Social History Narrative   Not on file   Social Drivers of Health   Tobacco Use: Low Risk (12/18/2023)   Patient History    Smoking Tobacco Use: Never    Smokeless Tobacco Use: Never    Passive Exposure: Not on file  Financial Resource Strain: Not on file  Food Insecurity: No Food Insecurity (12/19/2023)   Epic    Worried About Programme Researcher, Broadcasting/film/video in the Last Year: Never true    Ran Out of Food in the Last Year: Never true  Transportation Needs: No Transportation Needs (12/19/2023)   Epic    Lack of Transportation (Medical): No    Lack of Transportation (Non-Medical): No  Physical Activity: Not on file  Stress: Not on file  Social Connections: Moderately Integrated (12/19/2023)   Social Connection and Isolation Panel    Frequency of Communication with Friends and Family: Once a week    Frequency of Social Gatherings with Friends and Family: More than three times a week    Attends Religious Services: More than 4 times per year    Active Member of [employer] or Organizations: No    Attends Banker Meetings: Never    Marital Status: Married  Catering Manager Violence: Not At Risk (12/19/2023)   Epic    Fear of Current or Ex-Partner: No    Emotionally Abused: No    Physically Abused: No    Sexually Abused: No  Depression (PHQ2-9): Low Risk (12/04/2023)   Depression (PHQ2-9)    PHQ-2 Score: 0  Alcohol Screen: Not on file  Housing: Unknown (12/19/2023)   Epic    Unable to Pay for Housing in the Last Year: No    Number of Times Moved in the Last Year: Not on file    Homeless in the Last Year: No  Utilities: Not At Risk (12/19/2023)   Epic    Threatened with loss of utilities: No  Health Literacy: Not on file    Family History  Problem Relation Age of Onset   Colon cancer Neg Hx    Esophageal cancer Neg Hx    Rectal cancer Neg Hx    Stomach cancer Neg Hx     Allergies as of 02/14/2024 - Review  Complete 12/18/2023  Allergen Reaction Noted   Sulfa antibiotics Other (See Comments) 05/13/2014   Sulfonamide derivatives Other (See Comments) 12/21/2010   Latex Itching and Dermatitis 12/04/2023   [facility] astringent eye drops [tetrahydrozoline-zn sulfate] Other (See Comments) 01/11/2020    Medications Ordered Prior to Encounter[1] REVIEW OF SYSTEMS:  Review of Systems  Respiratory:  Negative for shortness of breath.   Cardiovascular:  Positive for leg swelling. Negative for chest pain.   PHYSICAL EXAMINATION:  Vitals:   02/14/24 1429  BP: 136/81  Pulse: 94  SpO2: 94%    There is no height or weight on file to calculate BMI.  Physical Exam Vitals reviewed.  Pulmonary:     Effort: Pulmonary effort  is normal.  Musculoskeletal:        General: Tenderness present.     Left lower leg: Edema present.  Neurological:     Mental Status: She is alert.  Psychiatric:        Mood and Affect: Mood normal.    Villalta Score for Post-Thrombotic Syndrome: Pain: Mild Cramps: Absent Heaviness: Absent Paresthesia: Absent Pruritus: Absent Pretibial Edema: Mild Skin Induration: Absent Hyperpigmentation: Absent Redness: Mild Venous Ectasia: Absent Pain on calf compression: Mild Villalta Preliminary Score: 4 Is venous ulcer present?: No If venous ulcer is present and score is <15, then 15 points total are assigned: Absent Villalta Total Score: 4  LABS:  CBC     Component Value Date/Time   WBC 5.8 12/23/2023 0546   RBC 3.12 (L) 12/23/2023 0546   HGB 10.2 (L) 12/23/2023 0546   HGB 14.7 12/04/2023 1033   HGB 14.5 12/18/2016 1125   HGB 13.9 09/30/2007 1125   HCT 30.3 (L) 12/23/2023 0546   HCT 42.7 12/18/2016 1125   HCT 40.4 09/30/2007 1125   PLT 156 12/23/2023 0546   PLT 262 12/04/2023 1033   PLT 223 12/18/2016 1125   PLT 310 09/30/2007 1125   MCV 97.1 12/23/2023 0546   MCV 95 12/18/2016 1125   MCV 87.2 09/30/2007 1125   MCH 32.7 12/23/2023 0546   MCHC 33.7 12/23/2023 0546    RDW 12.3 12/23/2023 0546   RDW 12.1 12/18/2016 1125   RDW 15.3 (H) 09/30/2007 1125   LYMPHSABS 0.8 12/18/2023 1533   LYMPHSABS 2.0 12/18/2016 1125   LYMPHSABS 1.1 09/30/2007 1125   MONOABS 0.6 12/18/2023 1533   MONOABS 0.4 09/30/2007 1125   EOSABS 0.0 12/18/2023 1533   EOSABS 0.1 12/18/2016 1125   BASOSABS 0.0 12/18/2023 1533   BASOSABS 0.1 12/18/2016 1125   BASOSABS 0.1 09/30/2007 1125    Hepatic Function      Component Value Date/Time   PROT 6.8 12/04/2023 1033   PROT 6.9 12/18/2016 1125   PROT 6.8 12/14/2015 0932   ALBUMIN 4.3 12/04/2023 1033   ALBUMIN 3.8 12/18/2016 1125   ALBUMIN 3.7 12/14/2015 0932   AST 22 12/04/2023 1033   AST 32 12/14/2015 0932   ALT 16 12/04/2023 1033   ALT 32 12/18/2016 1125   ALT 35 12/14/2015 0932   ALKPHOS 93 12/04/2023 1033   ALKPHOS 73 12/18/2016 1125   ALKPHOS 75 12/14/2015 0932   BILITOT 0.7 12/04/2023 1033   BILITOT 0.82 12/14/2015 0932    Renal Function   Lab Results  Component Value Date   CREATININE 0.39 (L) 12/23/2023   CREATININE 0.64 12/22/2023   CREATININE 0.82 12/21/2023    CrCl cannot be calculated (Patient's most recent lab result is older than the maximum 21 days allowed.).   [facility] Vascular Lab Studies:  02/06/24 US  Venous Img Lower Unilateral Left (DVT): FINDINGS: Contralateral Common Femoral Vein: Negative for thrombus. Normal compressibility, color Doppler flow and phasicity.   Common Femoral Vein: No evidence of thrombus. Normal compressibility, respiratory phasicity and response to augmentation.   Saphenofemoral Junction: No evidence of thrombus. Normal compressibility and flow on color Doppler imaging.   Profunda Femoral Vein: No evidence of thrombus. Normal compressibility and flow on color Doppler imaging.   Femoral Vein: Positive for thrombus. Mid femoral vein has incomplete compressibility. Nonocclusive thrombus in left mid femoral vein. Minimal compressibility in the distal left femoral vein  with nonocclusive thrombus.   Popliteal Vein: Positive for thrombus. Left popliteal vein is not compressible with nonocclusive  echogenic thrombus.   Calf Veins: Limited evaluation of the left deep calf veins.   Other Findings: Left great saphenous vein is compressible without thrombus.   IMPRESSION: 1. Positive for deep venous thrombosis in the left lower extremity. Thrombus involving the left femoral vein and left popliteal vein. Limited evaluation of the left deep calf veins.  ASSESSMENT: Location of DVT: Left femoral vein, Left popliteal vein Cause of DVT: provoked by a transient risk factor  Patient with prior history of PE diagnosed with acute DVT in the left femoral and popliteal veins, limited evaluation of the calf veins. Provoking factors include traumatic injuries from fall including left tibial plateau fracture and hospitalization and leg immobilization. She was appropriately started on Eliquis. She has a history of PE in 2009 which was treated with warfarin. Following completion of treatment for PE, Dr. Timmy started her on aspirin 162 mg daily to reduce risk of recurrence which she had been taking up until current DVT diagnosis. She was without aspirin for several days following hospital discharge while at the SNF for rehab. Recommend to continue Eliquis and will have her follow up with Dr. Timmy for further anticoagulation management. Counseled to wear compression stockings to help with swelling which she says she has at home. Extensively counseled on Eliquis. No medication adherence barriers identified. No follow up imaging for DVT recommended.  PLAN: -Continue apixaban (Eliquis) 5 mg twice daily. -Expected duration of therapy: per hematology. Therapy started on 02/06/2024. -Patient educated on purpose, proper use and potential adverse effects of apixaban (Eliquis). -Discussed importance of taking medication around the same time every day. -Advised patient of medications  to avoid (NSAIDs, aspirin doses >100 mg daily). -Educated that Tylenol  (acetaminophen ) is the preferred analgesic to lower the risk of bleeding. -Advised patient to alert all providers of anticoagulation therapy prior to starting a new medication or having a procedure. -Emphasized importance of monitoring for signs and symptoms of bleeding (abnormal bruising, prolonged bleeding, nose bleeds, bleeding from gums, discolored urine, black tarry stools). -Educated patient to present to the ED if emergent signs and symptoms of new thrombosis occur. -Counseled patient to wear compression stockings daily, removing at night.  Follow up: with hematology  Izetta Henry, PharmD, CPP [facility] [phone]     [1]  Current Outpatient Medications on File Prior to Visit  Medication Sig Dispense Refill   acetaminophen  (TYLENOL ) 500 MG tablet Take 500 mg by mouth every 6 (six) hours as needed. (Patient taking differently: Take 1,000 mg by mouth every 8 (eight) hours as needed.)     [Paused] ALPRAZolam  (XANAX ) 0.5 MG tablet Take 0.5 mg by mouth daily as needed. Uses when flying or occasional sleep.     busPIRone  (BUSPAR ) 10 MG tablet Take 1 tablet (10 mg total) by mouth 2 (two) times daily.     Cholecalciferol  (VITAMIN D3) 50 MCG (2000 UT) CAPS Take by mouth 2 (two) times daily.     ELIQUIS 5 MG TABS tablet Take 5 mg by mouth 2 (two) times daily.     gabapentin  (NEURONTIN ) 300 MG capsule Take 1 capsule (300 mg total) by mouth 3 (three) times daily.     hydrOXYzine  (ATARAX ) 25 MG tablet Take 1 tablet (25 mg total) by mouth 3 (three) times daily as needed for anxiety.     Magnesium  250 MG TABS Take 1 tablet by mouth 3 (three) times a week. (Patient taking differently: Take 1 tablet by mouth daily.)  methocarbamol  (ROBAXIN ) 750 MG tablet Take 1 tablet (750 mg total) by mouth 4 (four) times daily.     oxyCODONE  (OXY IR/ROXICODONE ) 5 MG immediate release tablet  Take 1-1.5 tablets (5-7.5 mg total) by mouth every 4 (four) hours as needed for moderate pain (pain score 4-6) or severe pain (pain score 7-10). 20 tablet 0   acyclovir (ZOVIRAX) 200 MG capsule Take 200-600 mg by mouth See admin instructions. 400 mg in the morning and 600 in the evening if she has a break out     aspirin 81 MG EC tablet Take 162 mg by mouth at bedtime. Swallow whole. (Patient not taking: Reported on 02/14/2024)     celecoxib (CELEBREX) 200 MG capsule Take 200 mg by mouth as needed. (Patient not taking: Reported on 02/14/2024)     docusate sodium  (COLACE) 100 MG capsule Take 1 capsule (100 mg total) by mouth 2 (two) times daily. (Patient not taking: Reported on 02/14/2024)     Elderberry 500 MG CAPS Take 1 capsule by mouth daily.     fluticasone (FLONASE) 50 MCG/ACT nasal spray Place 2 sprays into both nostrils as needed for allergies or rhinitis. (Patient not taking: Reported on 02/14/2024)     OVER THE COUNTER MEDICATION Take 1 tablet by mouth daily. WEEM supplement     No current facility-administered medications on file prior to visit.   "

## 2024-02-14 ENCOUNTER — Ambulatory Visit: Admitting: Pharmacist

## 2024-02-14 VITALS — BP 136/81 | HR 94

## 2024-02-14 DIAGNOSIS — I82412 Acute embolism and thrombosis of left femoral vein: Secondary | ICD-10-CM

## 2024-02-14 NOTE — Patient Instructions (Signed)
-  Continue apixaban (Eliquis) 5 mg twice daily. - Your primary care doctor has already sent in refills for you.  -It is important to take your medication around the same time every day.  -Avoid NSAIDs like ibuprofen  (Advil , Motrin ) and naproxen (Aleve) as well as aspirin doses over 100 mg daily. -Tylenol  (acetaminophen ) is the preferred over the counter pain medication to lower the risk of bleeding. -Be sure to alert all of your health care providers that you are taking an anticoagulant prior to starting a new medication or having a procedure. -Monitor for signs and symptoms of bleeding (abnormal bruising, prolonged bleeding, nose bleeds, bleeding from gums, discolored urine, black tarry stools). If you have fallen and hit your head OR if your bleeding is severe or not stopping, seek emergency care.  -Go to the emergency room if emergent signs and symptoms of new clot occur (new or worse swelling and pain in an arm or leg, shortness of breath, chest pain, fast or irregular heartbeats, lightheadedness, dizziness, fainting, coughing up blood) or if you experience a significant color change (pale or blue) in the extremity that has the DVT.  -We recommend you wear compression stockings (20-30 mmHg) as long as you are having swelling or pain. Be sure to purchase the correct size and take them off at night.   If you have any questions or need to reschedule an appointment, please call [phone]. If you are having an emergency, call 911 or present to the nearest emergency room.   What is a DVT?  -Deep vein thrombosis (DVT) is a condition in which a blood clot forms in a vein of the deep venous system which can occur in the lower leg, thigh, pelvis, arm, or neck. This condition is serious and can be life-threatening if the clot travels to the arteries of the lungs and causing a blockage (pulmonary embolism, PE). A DVT can also damage veins in the leg, which can lead to long-term venous disease, leg pain,  swelling, discoloration, and ulcers or sores (post-thrombotic syndrome).  -Treatment may include taking an anticoagulant medication to prevent more clots from forming and the current clot from growing, wearing compression stockings, and/or surgical procedures to remove or dissolve the clot.

## 2024-06-10 ENCOUNTER — Inpatient Hospital Stay: Admitting: Hematology & Oncology

## 2024-06-10 ENCOUNTER — Inpatient Hospital Stay
